# Patient Record
Sex: Male | Born: 1955
Health system: Southern US, Community
[De-identification: ages and names within clinical notes are randomized; demographics above are authoritative.]

## PROBLEM LIST (undated history)

## (undated) DIAGNOSIS — G4730 Sleep apnea, unspecified: Secondary | ICD-10-CM

## (undated) DIAGNOSIS — I10 Essential (primary) hypertension: Secondary | ICD-10-CM

## (undated) DIAGNOSIS — H269 Unspecified cataract: Secondary | ICD-10-CM

## (undated) DIAGNOSIS — E785 Hyperlipidemia, unspecified: Secondary | ICD-10-CM

## (undated) DIAGNOSIS — Z87891 Personal history of nicotine dependence: Secondary | ICD-10-CM

## (undated) DIAGNOSIS — I2510 Atherosclerotic heart disease of native coronary artery without angina pectoris: Secondary | ICD-10-CM

## (undated) DIAGNOSIS — E119 Type 2 diabetes mellitus without complications: Secondary | ICD-10-CM

## (undated) DIAGNOSIS — K429 Umbilical hernia without obstruction or gangrene: Secondary | ICD-10-CM

## (undated) DIAGNOSIS — G4733 Obstructive sleep apnea (adult) (pediatric): Secondary | ICD-10-CM

## (undated) DIAGNOSIS — K219 Gastro-esophageal reflux disease without esophagitis: Secondary | ICD-10-CM

## (undated) HISTORY — DX: Atherosclerotic heart disease of native coronary artery without angina pectoris: I25.10

## (undated) HISTORY — DX: Obstructive sleep apnea (adult) (pediatric): G47.33

## (undated) HISTORY — DX: Gastro-esophageal reflux disease without esophagitis: K21.9

## (undated) HISTORY — DX: Sleep apnea, unspecified: G47.30

## (undated) HISTORY — DX: Essential (primary) hypertension: I10

## (undated) HISTORY — DX: Hyperlipidemia, unspecified: E78.5

## (undated) HISTORY — DX: Type 2 diabetes mellitus without complications: E11.9

## (undated) HISTORY — DX: Unspecified cataract: H26.9

## (undated) HISTORY — DX: Umbilical hernia without obstruction or gangrene: K42.9

## (undated) HISTORY — DX: Personal history of nicotine dependence: Z87.891

---

## 1994-09-15 HISTORY — PX: SIGMOIDOSCOPY: SUR1295

## 1999-09-16 HISTORY — PX: SINUS SURGERY WITH INSTATRAK: SHX5215

## 2000-09-15 DIAGNOSIS — I2510 Atherosclerotic heart disease of native coronary artery without angina pectoris: Secondary | ICD-10-CM

## 2000-09-15 HISTORY — DX: Atherosclerotic heart disease of native coronary artery without angina pectoris: I25.10

## 2001-04-15 HISTORY — PX: CORONARY STENT PLACEMENT: SHX1402

## 2001-05-01 ENCOUNTER — Encounter: Payer: Self-pay | Admitting: *Deleted

## 2001-05-01 ENCOUNTER — Inpatient Hospital Stay (HOSPITAL_COMMUNITY): Admission: RE | Admit: 2001-05-01 | Discharge: 2001-05-06 | Payer: Self-pay | Admitting: *Deleted

## 2002-09-15 HISTORY — PX: FINGER FRACTURE SURGERY: SHX638

## 2003-02-28 ENCOUNTER — Emergency Department (HOSPITAL_COMMUNITY): Admission: EM | Admit: 2003-02-28 | Discharge: 2003-02-28 | Payer: Self-pay | Admitting: Emergency Medicine

## 2003-02-28 ENCOUNTER — Encounter: Payer: Self-pay | Admitting: Emergency Medicine

## 2003-02-28 ENCOUNTER — Ambulatory Visit (HOSPITAL_BASED_OUTPATIENT_CLINIC_OR_DEPARTMENT_OTHER): Admission: RE | Admit: 2003-02-28 | Discharge: 2003-02-28 | Payer: Self-pay | Admitting: Orthopedic Surgery

## 2003-04-18 ENCOUNTER — Ambulatory Visit (HOSPITAL_BASED_OUTPATIENT_CLINIC_OR_DEPARTMENT_OTHER): Admission: RE | Admit: 2003-04-18 | Discharge: 2003-04-18 | Payer: Self-pay | Admitting: Orthopedic Surgery

## 2004-10-22 ENCOUNTER — Ambulatory Visit: Payer: Self-pay | Admitting: Cardiology

## 2004-10-30 ENCOUNTER — Ambulatory Visit: Payer: Self-pay

## 2005-12-11 ENCOUNTER — Ambulatory Visit: Payer: Self-pay | Admitting: Cardiology

## 2005-12-23 ENCOUNTER — Ambulatory Visit: Payer: Self-pay

## 2006-09-15 HISTORY — PX: ANAL FISSURE REPAIR: SHX2312

## 2006-11-26 ENCOUNTER — Ambulatory Visit: Payer: Self-pay | Admitting: Cardiology

## 2006-11-26 LAB — CONVERTED CEMR LAB
BUN: 11 mg/dL (ref 6–23)
Basophils Absolute: 0 10*3/uL (ref 0.0–0.1)
Basophils Relative: 0.4 % (ref 0.0–1.0)
CO2: 30 meq/L (ref 19–32)
Calcium: 9.2 mg/dL (ref 8.4–10.5)
Chloride: 103 meq/L (ref 96–112)
Cholesterol: 132 mg/dL (ref 0–200)
Creatinine, Ser: 1.2 mg/dL (ref 0.4–1.5)
Eosinophils Absolute: 0.1 10*3/uL (ref 0.0–0.6)
Eosinophils Relative: 1.2 % (ref 0.0–5.0)
GFR calc Af Amer: 83 mL/min
GFR calc non Af Amer: 68 mL/min
Glucose, Bld: 86 mg/dL (ref 70–99)
HCT: 42.2 % (ref 39.0–52.0)
HDL: 31 mg/dL — ABNORMAL LOW (ref >39.0)
Hemoglobin: 14.8 g/dL (ref 13.0–17.0)
LDL Cholesterol: 82 mg/dL (ref 0–99)
Lymphocytes Relative: 29.8 % (ref 12.0–46.0)
MCHC: 34.9 g/dL (ref 30.0–36.0)
MCV: 87.3 fL (ref 78.0–100.0)
Monocytes Absolute: 0.7 10*3/uL (ref 0.2–0.7)
Monocytes Relative: 8.9 % (ref 3.0–11.0)
Neutro Abs: 4.6 10*3/uL (ref 1.4–7.7)
Neutrophils Relative %: 59.7 % (ref 43.0–77.0)
Platelets: 261 10*3/uL (ref 150–400)
Potassium: 4.2 meq/L (ref 3.5–5.1)
RBC: 4.84 M/uL (ref 4.22–5.81)
RDW: 12.8 % (ref 11.5–14.6)
Sodium: 140 meq/L (ref 135–145)
TSH: 2.87 microintl units/mL (ref 0.35–5.50)
Total CHOL/HDL Ratio: 4.3
Triglycerides: 97 mg/dL (ref 0–149)
VLDL: 19 mg/dL (ref 0–40)
WBC: 7.7 10*3/uL (ref 4.5–10.5)

## 2007-06-14 ENCOUNTER — Encounter: Admission: RE | Admit: 2007-06-14 | Discharge: 2007-06-14 | Payer: Self-pay | Admitting: Surgery

## 2007-06-16 ENCOUNTER — Ambulatory Visit (HOSPITAL_BASED_OUTPATIENT_CLINIC_OR_DEPARTMENT_OTHER): Admission: RE | Admit: 2007-06-16 | Discharge: 2007-06-16 | Payer: Self-pay | Admitting: Surgery

## 2007-06-16 ENCOUNTER — Encounter (INDEPENDENT_AMBULATORY_CARE_PROVIDER_SITE_OTHER): Payer: Self-pay | Admitting: Surgery

## 2007-11-18 ENCOUNTER — Ambulatory Visit: Payer: Self-pay | Admitting: Cardiology

## 2007-11-18 LAB — CONVERTED CEMR LAB
ALT: 48 units/L (ref 0–53)
AST: 36 units/L (ref 0–37)
Albumin: 4.1 g/dL (ref 3.5–5.2)
Alkaline Phosphatase: 84 units/L (ref 39–117)
BUN: 11 mg/dL (ref 6–23)
Basophils Absolute: 0.1 10*3/uL (ref 0.0–0.1)
Basophils Relative: 0.7 % (ref 0.0–1.0)
Bilirubin, Direct: 0.2 mg/dL (ref 0.0–0.3)
CO2: 30 meq/L (ref 19–32)
Calcium: 9.5 mg/dL (ref 8.4–10.5)
Chloride: 102 meq/L (ref 96–112)
Cholesterol: 131 mg/dL (ref 0–200)
Creatinine, Ser: 1.2 mg/dL (ref 0.4–1.5)
Eosinophils Absolute: 0.1 10*3/uL (ref 0.0–0.6)
Eosinophils Relative: 1 % (ref 0.0–5.0)
GFR calc Af Amer: 82 mL/min
GFR calc non Af Amer: 68 mL/min
Glucose, Bld: 110 mg/dL — ABNORMAL HIGH (ref 70–99)
HCT: 46.5 % (ref 39.0–52.0)
HDL: 22.4 mg/dL — ABNORMAL LOW (ref >39.0)
Hemoglobin: 15.7 g/dL (ref 13.0–17.0)
LDL Cholesterol: 87 mg/dL (ref 0–99)
Lymphocytes Relative: 27.7 % (ref 12.0–46.0)
MCHC: 33.8 g/dL (ref 30.0–36.0)
MCV: 88.2 fL (ref 78.0–100.0)
Magnesium: 1.9 mg/dL (ref 1.5–2.5)
Monocytes Absolute: 0.8 10*3/uL — ABNORMAL HIGH (ref 0.2–0.7)
Monocytes Relative: 8.3 % (ref 3.0–11.0)
Neutro Abs: 5.6 10*3/uL (ref 1.4–7.7)
Neutrophils Relative %: 62.3 % (ref 43.0–77.0)
Platelets: 226 10*3/uL (ref 150–400)
Potassium: 4.6 meq/L (ref 3.5–5.1)
RBC: 5.27 M/uL (ref 4.22–5.81)
RDW: 12.4 % (ref 11.5–14.6)
Sodium: 138 meq/L (ref 135–145)
TSH: 2.77 microintl units/mL (ref 0.35–5.50)
Total Bilirubin: 1.1 mg/dL (ref 0.3–1.2)
Total CHOL/HDL Ratio: 5.8
Total Protein: 7.5 g/dL (ref 6.0–8.3)
Triglycerides: 110 mg/dL (ref 0–149)
VLDL: 22 mg/dL (ref 0–40)
WBC: 9.1 10*3/uL (ref 4.5–10.5)

## 2007-11-26 DIAGNOSIS — I2510 Atherosclerotic heart disease of native coronary artery without angina pectoris: Secondary | ICD-10-CM | POA: Insufficient documentation

## 2007-11-26 DIAGNOSIS — E1169 Type 2 diabetes mellitus with other specified complication: Secondary | ICD-10-CM | POA: Insufficient documentation

## 2007-11-26 DIAGNOSIS — R0609 Other forms of dyspnea: Secondary | ICD-10-CM | POA: Insufficient documentation

## 2007-11-26 DIAGNOSIS — R0602 Shortness of breath: Secondary | ICD-10-CM | POA: Insufficient documentation

## 2007-11-26 DIAGNOSIS — K219 Gastro-esophageal reflux disease without esophagitis: Secondary | ICD-10-CM | POA: Insufficient documentation

## 2007-11-26 DIAGNOSIS — E785 Hyperlipidemia, unspecified: Secondary | ICD-10-CM

## 2007-11-26 DIAGNOSIS — R0600 Dyspnea, unspecified: Secondary | ICD-10-CM | POA: Insufficient documentation

## 2007-11-29 ENCOUNTER — Ambulatory Visit: Payer: Self-pay | Admitting: Pulmonary Disease

## 2007-11-29 DIAGNOSIS — G4733 Obstructive sleep apnea (adult) (pediatric): Secondary | ICD-10-CM | POA: Insufficient documentation

## 2007-11-30 ENCOUNTER — Encounter: Payer: Self-pay | Admitting: Pulmonary Disease

## 2007-11-30 ENCOUNTER — Ambulatory Visit (HOSPITAL_BASED_OUTPATIENT_CLINIC_OR_DEPARTMENT_OTHER): Admission: RE | Admit: 2007-11-30 | Discharge: 2007-11-30 | Payer: Self-pay | Admitting: Pulmonary Disease

## 2007-12-21 ENCOUNTER — Ambulatory Visit: Payer: Self-pay | Admitting: Pulmonary Disease

## 2007-12-22 ENCOUNTER — Telehealth (INDEPENDENT_AMBULATORY_CARE_PROVIDER_SITE_OTHER): Payer: Self-pay | Admitting: *Deleted

## 2007-12-27 ENCOUNTER — Ambulatory Visit: Payer: Self-pay | Admitting: Pulmonary Disease

## 2008-01-07 ENCOUNTER — Telehealth: Payer: Self-pay | Admitting: Pulmonary Disease

## 2008-11-17 ENCOUNTER — Encounter: Payer: Self-pay | Admitting: Cardiology

## 2008-11-17 ENCOUNTER — Ambulatory Visit: Payer: Self-pay | Admitting: Cardiology

## 2008-11-17 LAB — CONVERTED CEMR LAB
ALT: 40 units/L (ref 0–53)
AST: 33 units/L (ref 0–37)
Albumin: 4.1 g/dL (ref 3.5–5.2)
Alkaline Phosphatase: 83 units/L (ref 39–117)
BUN: 16 mg/dL (ref 6–23)
Basophils Absolute: 0 10*3/uL (ref 0.0–0.1)
Basophils Relative: 0.3 % (ref 0.0–3.0)
Bilirubin, Direct: 0.2 mg/dL (ref 0.0–0.3)
CO2: 27 meq/L (ref 19–32)
Calcium: 9.3 mg/dL (ref 8.4–10.5)
Chloride: 102 meq/L (ref 96–112)
Cholesterol: 116 mg/dL (ref 0–200)
Creatinine, Ser: 1.1 mg/dL (ref 0.4–1.5)
Eosinophils Absolute: 0.1 10*3/uL (ref 0.0–0.7)
Eosinophils Relative: 0.7 % (ref 0.0–5.0)
GFR calc Af Amer: 91 mL/min
GFR calc non Af Amer: 75 mL/min
Glucose, Bld: 99 mg/dL (ref 70–99)
HCT: 43.5 % (ref 39.0–52.0)
HDL: 26.3 mg/dL — ABNORMAL LOW (ref >39.0)
Hemoglobin: 15.3 g/dL (ref 13.0–17.0)
LDL Cholesterol: 69 mg/dL (ref 0–99)
Lymphocytes Relative: 26.5 % (ref 12.0–46.0)
MCHC: 35.1 g/dL (ref 30.0–36.0)
MCV: 88 fL (ref 78.0–100.0)
Monocytes Absolute: 0.8 10*3/uL (ref 0.1–1.0)
Monocytes Relative: 8.1 % (ref 3.0–12.0)
Neutro Abs: 6.5 10*3/uL (ref 1.4–7.7)
Neutrophils Relative %: 64.4 % (ref 43.0–77.0)
Platelets: 216 10*3/uL (ref 150–400)
Potassium: 3.9 meq/L (ref 3.5–5.1)
RBC: 4.94 M/uL (ref 4.22–5.81)
RDW: 12 % (ref 11.5–14.6)
Sodium: 138 meq/L (ref 135–145)
TSH: 2.63 microintl units/mL (ref 0.35–5.50)
Total Bilirubin: 1.2 mg/dL (ref 0.3–1.2)
Total CHOL/HDL Ratio: 4.4
Total Protein: 7.7 g/dL (ref 6.0–8.3)
Triglycerides: 102 mg/dL (ref 0–149)
VLDL: 20 mg/dL (ref 0–40)
WBC: 10 10*3/uL (ref 4.5–10.5)

## 2009-11-16 ENCOUNTER — Ambulatory Visit: Payer: Self-pay | Admitting: Cardiology

## 2010-10-13 LAB — CONVERTED CEMR LAB
ALT: 44 units/L (ref 0–53)
AST: 37 units/L (ref 0–37)
Albumin: 4.1 g/dL (ref 3.5–5.2)
Alkaline Phosphatase: 79 units/L (ref 39–117)
BUN: 12 mg/dL (ref 6–23)
Basophils Absolute: 0 10*3/uL (ref 0.0–0.1)
Basophils Relative: 0.4 % (ref 0.0–3.0)
Bilirubin, Direct: 0.1 mg/dL (ref 0.0–0.3)
CO2: 30 meq/L (ref 19–32)
Calcium: 9.1 mg/dL (ref 8.4–10.5)
Chloride: 102 meq/L (ref 96–112)
Cholesterol: 111 mg/dL (ref 0–200)
Creatinine, Ser: 1.1 mg/dL (ref 0.4–1.5)
Eosinophils Absolute: 0.1 10*3/uL (ref 0.0–0.7)
Eosinophils Relative: 1.1 % (ref 0.0–5.0)
GFR calc non Af Amer: 74.44 mL/min (ref >60)
Glucose, Bld: 114 mg/dL — ABNORMAL HIGH (ref 70–99)
HCT: 45 % (ref 39.0–52.0)
HDL: 33.5 mg/dL — ABNORMAL LOW (ref >39.00)
Hemoglobin: 14.9 g/dL (ref 13.0–17.0)
LDL Cholesterol: 50 mg/dL (ref 0–99)
Lymphocytes Relative: 28.4 % (ref 12.0–46.0)
Lymphs Abs: 2.3 10*3/uL (ref 0.7–4.0)
MCHC: 33.1 g/dL (ref 30.0–36.0)
MCV: 90.7 fL (ref 78.0–100.0)
Monocytes Absolute: 0.6 10*3/uL (ref 0.1–1.0)
Monocytes Relative: 7.2 % (ref 3.0–12.0)
Neutro Abs: 5.2 10*3/uL (ref 1.4–7.7)
Neutrophils Relative %: 62.9 % (ref 43.0–77.0)
Platelets: 238 10*3/uL (ref 150.0–400.0)
Potassium: 4.5 meq/L (ref 3.5–5.1)
RBC: 4.96 M/uL (ref 4.22–5.81)
RDW: 12.5 % (ref 11.5–14.6)
Sodium: 137 meq/L (ref 135–145)
TSH: 1.97 microintl units/mL (ref 0.35–5.50)
Total Bilirubin: 0.6 mg/dL (ref 0.3–1.2)
Total CHOL/HDL Ratio: 3
Total Protein: 8 g/dL (ref 6.0–8.3)
Triglycerides: 139 mg/dL (ref 0.0–149.0)
VLDL: 27.8 mg/dL (ref 0.0–40.0)
WBC: 8.2 10*3/uL (ref 4.5–10.5)

## 2010-10-17 NOTE — Assessment & Plan Note (Signed)
Summary: yearly  Medications Added LANSOPRAZOLE 30 MG TBDP (LANSOPRAZOLE) Take 1 tablet by mouth once a day      Allergies Added: NKDA  Visit Type:  Follow-up Referring Provider:  Charlies Constable Primary Provider:  Marjory Lies  CC:  no complaints.  History of Present Illness: The patient is 55 years old and return for management of CAD. In 2002 he had PCI with stenting of all 3 vessels for three-vessel disease with bare-metal stents. He's done quite well since that time. He's had no recent chest pain shortness of breath or palpitations.  He's working long hours at [employer].  He is in the maintenance department.  He has gained about 30 pounds since he quit smoking when he had his stents put in in 2002. He said he's gained about 3 pounds over the last year.  Current Medications (verified): 1)  Lansoprazole 30 Mg Tbdp (Lansoprazole) .... Take 1 Tablet By Mouth Once A Day 2)  Sg Enteric Coated Aspirin 325 Mg  Tbec (Aspirin) .... Take 1 Tablet By Mouth Once A Day 3)  Lipitor 20 Mg  Tabs (Atorvastatin Calcium) .... Take 1 Tablet By Mouth Once A Day 4)  Toprol Xl 25 Mg  Tb24 (Metoprolol Succinate) .... Take 1/2 Tab By Mouth Once Daily 5)  Nitroquick 0.4 Mg  Subl (Nitroglycerin) .... Use As Needed  Allergies (verified): No Known Drug Allergies  Past History:  Past Medical History: Reviewed history from 11/16/2008 and no changes required. Current Problems:  DYSPNEA ON EXERTION (ICD-786.09) GERD (ICD-530.81) HYPERLIPIDEMIA (ICD-272.4) CORONARY ARTERY DISEASE (ICD-414.00) S/P 3 vessel PCI 2002     Review of Systems       ROS is negative except as outlined in HPI.   Vital Signs:  Patient profile:   55 year old male Height:      70 inches Weight:      254 pounds BMI:     36.58 Pulse rate:   48 / minute BP sitting:   132 / 88  (left arm) Cuff size:   regular  Vitals Entered By: Hardin Negus, RMA (November 16, 2009 9:40 AM)  Physical Exam  Additional Exam:  Gen.  Well-nourished, in no distress   Neck: No JVD, thyroid not enlarged, no carotid bruits Lungs: No tachypnea, clear without rales, rhonchi or wheezes Cardiovascular: Rhythm regular, PMI not displaced,  heart sounds  normal, no murmurs or gallops, no peripheral edema, pulses normal in all 4 extremities. Abdomen: BS normal, abdomen soft and non-tender without masses or organomegaly, no hepatosplenomegaly. MS: No deformities, no cyanosis or clubbing   Neuro:  No focal sns   Skin:  no lesions    Impression & Recommendations:  Problem # 1:  CORONARY ARTERY DISEASE (ICD-414.00) He had bare-metal stents placed in all 3 arteries in 2002 for three-vessel disease. He has been stable without chest pain. This appears stable. His updated medication list for this problem includes:    Sg Enteric Coated Aspirin 325 Mg Tbec (Aspirin) .Marland Kitchen... Take 1 tablet by mouth once a day    Toprol Xl 25 Mg Tb24 (Metoprolol succinate) .Marland Kitchen... Take 1/2 tab by mouth once daily    Nitroquick 0.4 Mg Subl (Nitroglycerin) ..... Use as needed  Orders: EKG w/ Interpretation (93000) TLB-BMP (Basic Metabolic Panel-BMET) (80048-METABOL) TLB-CBC Platelet - w/Differential (85025-CBCD) TLB-Hepatic/Liver Function Pnl (80076-HEPATIC) TLB-Lipid Panel (80061-LIPID) TLB-TSH (Thyroid Stimulating Hormone) (84443-TSH)  Problem # 2:  HYPERLIPIDEMIA (ICD-272.4) He is currently on Lipitor.  We will check a lipid profile today. His  updated medication list for this problem includes:    Lipitor 20 Mg Tabs (Atorvastatin calcium) .Marland Kitchen... Take 1 tablet by mouth once a day  Orders: TLB-BMP (Basic Metabolic Panel-BMET) (80048-METABOL) TLB-CBC Platelet - w/Differential (85025-CBCD) TLB-Hepatic/Liver Function Pnl (80076-HEPATIC) TLB-Lipid Panel (80061-LIPID) TLB-TSH (Thyroid Stimulating Hormone) (84443-TSH)  Problem # 3:  HYPERTENSION, BENIGN (ICD-401.1) this is controlled on current medications. His updated medication list for this problem  includes:    Sg Enteric Coated Aspirin 325 Mg Tbec (Aspirin) .Marland Kitchen... Take 1 tablet by mouth once a day    Toprol Xl 25 Mg Tb24 (Metoprolol succinate) .Marland Kitchen... Take 1/2 tab by mouth once daily  Patient Instructions: 1)  Your physician recommends that you have lab work today: bmet/cbc/tsh/lipid/liver (414.01;272.2;402.10) 2)  Your physician wants you to follow-up in: 1 year with Dr. Shirlee Latch.  You will receive a reminder letter in the mail two months in advance. If you don't receive a letter, please call our office to schedule the follow-up appointment. Prescriptions: NITROQUICK 0.4 MG  SUBL (NITROGLYCERIN) use as needed  #25 x 3   Entered by:   Sherri Rad, RN, BSN   Authorized by:   Lenoria Farrier, MD, [facility]   Signed by:   Sherri Rad, RN, BSN on 11/16/2009   Method used:   Electronically to        [id number] (ret[facility])       [address]       [place], [place]  [address]       Ph: [phone]       Fax: [phone]   RxID:   [id number] TOPROL XL 25 MG  TB24 (METOPROLOL SUCCINATE) take 1/2 tab by mouth once daily  #30 x 6   Entered by:   Sherri Rad, RN, BSN   Authorized by:   Lenoria Farrier, MD, [facility]   Signed by:   Sherri Rad, RN, BSN on 11/16/2009   Method used:   Electronically to        [id number] ([facility])       [address]       [place], [place]  [address]       Ph: [phone]       Fax: [phone]   RxID:   [id number] LIPITOR 20 MG  TABS (ATORVASTATIN CALCIUM) Take 1 tablet by mouth once a day  #30 x 11   Entered by:   Sherri Rad, RN, BSN   Authorized by:   Lenoria Farrier, MD, [facility]   Signed by:   Sherri Rad, RN, BSN on 11/16/2009   Method used:   Electronically to        [facility] [address] [id number] ([facility]       [address]       [place], [place]  [address]       Ph: [phone]       Fax: [phone]   RxID:    [id number] LANSOPRAZOLE 30 MG TBDP (LANSOPRAZOLE) Take 1 tablet by mouth once a day  #30 x 11   Entered by:   Sherri Rad, RN, BSN   Authorized by:   Lenoria Farrier, MD, [facility]   Signed by:   Sherri Rad, RN, BSN on 11/16/2009   Method used:   Electronically to  [facility] [address] [id number] ([facility])       [address]       [place], [place]  [address]       Ph: [phone]       Fax: [phone]   RxID:   [id number]

## 2011-01-27 ENCOUNTER — Encounter: Payer: Self-pay | Admitting: Cardiology

## 2011-01-28 NOTE — Progress Notes (Signed)
[facility]                        PERIPHERAL VASCULAR OFFICE NOTE   [name], [name]                       MRN:          [id number]  DATE:11/18/2007                            DOB:          04/01/1956    PRIMARY CARE PHYSICIAN:  Marjory Lies, M.D.   CLINICAL HISTORY:  Mr. 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  He returned for management of his  coronary heart disease.  In 2002, he had 3-vessel intervention with bare  metal stents to the LAD and right coronary artery and cutting-balloon  angioplasty to the circumflex artery.  He had a scan in 2007 which  showed no evidence of ischemia.  He says he has done quite well.  He has had no chest pain, shortness of  breath, or palpitations.  He does indicate that he has had some snoring  at night and his wife indicates that he has had some breathing  difficulty with apneic spells at night and was concerned about the  possibility of sleep apnea.   PAST MEDICAL HISTORY:  1. GERD.  2. Hyperlipidemia.   CURRENT MEDICATIONS:  1. Prilosec.  2. Aspirin.  3. Lipitor 20 mg.  4. Toprol XL 25 mg one half tablet daily.   PHYSICAL EXAMINATION:  VITAL SIGNS:  Blood pressure 114/80 and the pulse  64.  NECK:  There was no venous distention.  The carotid pulses were full  without bruits.  CHEST:  Clear.  CARDIAC:  Rhythm was regular.  The heart sounds were normal.  There were  no murmurs or gallops.  ABDOMEN:  Soft without organomegaly.  EXTREMITIES:  Peripheral pulses were full.  There is no peripheral  edema.   Electrocardiogram was normal.   IMPRESSION:  1. Coronary artery disease status post triple vessel percutaneous      coronary intervention with bare metal stent in 2002, stable.  2. Good left ventricular function.  3. Hyperlipidemia with low HDL.  4. Symptoms suggestive of obstructive sleep apnea.   RECOMMENDATIONS:  1. We will plan to get  laboratory studies today including a lipid,      liver, CBC, BMP, TSH, and magnesium level.  2. He has had some cramps, so we will get the magnesium level.  3. We will also get a pulmonary consult for evaluation of possible      sleep apnea.  I encouraged him to loose weight.  It appears that      his other risk factors are controlled with his cholesterol levels      pending.  4. I will plan to see him back in a year.     Bruce Elvera Lennox Juanda Chance, MD, [facility]  Electronically Signed    BRB/MedQ  DD: 11/18/2007  DT: 11/18/2007  Job #: [id number]   cc:   Marjory Lies, M.D.

## 2011-01-28 NOTE — Procedures (Signed)
[name], [name]                ACCOUNT NO.:  [id number]   MEDICAL RECORD NO.:  [id number]          PATIENT TYPE:  OUT   LOCATION:  SLEEP CENTER                 FACILITY:  [facility]   PHYSICIAN:  Barbaraann Share, MD,FCCPDATE OF BIRTH:  May 25, 1956   DATE OF STUDY:  11/30/2007                            NOCTURNAL POLYSOMNOGRAM   REFERRING PHYSICIAN:   REFERRING PHYSICIAN:  Barbaraann Share, MD,FCCP   INDICATIONS FOR STUDY:  Hypersomnia with sleep apnea.   EPWORTH SCORE:  9.   SLEEP ARCHITECTURE:  The patient had a total sleep time of 272 minutes  with no slow wave sleep and only 5 minutes of REM.  Sleep onset latency  was prolonged at 44 minutes, and REM latency was very prolonged at 337  minutes.  Sleep efficiency was poor at 70%.   RESPIRATORY DATA:  The patient was found 205 apneas and 112 obstructive  hypopneas for an apnea-hypopnea of 70 events per hour.  The events  occurred in all body positions and there was loud to very loud snoring  noted throughout.   OXYGEN DATA:  There was 02 desaturation as low as 80% with the patient's  obstructive events.   CARDIAC DATA:  No clinically significant arrhythmia noted.   MOVEMENT/PARASOMNIA:  None.   IMPRESSION/RECOMMENDATIONS:  Severe obstructive sleep apnea/hypopnea  syndrome with an apnea-hypopnea index of 70 events per hour and 02  desaturation as low as 80%.  Treatment for this degree of sleep apnea  should focus primarily on weight loss as well as CPAP.      Barbaraann Share, MD,FCCP  Diplomate, American Board of Sleep  Medicine  Electronically Signed     KMC/MEDQ  D:  12/21/2007 16:56:26  T:  12/21/2007 19:04:24  Job:  [id number]

## 2011-01-28 NOTE — Op Note (Signed)
[name], [name]                ACCOUNT NO.:  [id number]   MEDICAL RECORD NO.:  [id number]          PATIENT TYPE:  AMB   LOCATION:  DSC                          FACILITY:  [facility]   PHYSICIAN:  Thomas A. Cornett, M.D.DATE OF BIRTH:  Aug 06, 1956   DATE OF PROCEDURE:  06/16/2007  DATE OF DISCHARGE:                               OPERATIVE REPORT   PREOPERATIVE DIAGNOSIS:  Nonhealing chronic posterior midline anal  fissure.   POSTOPERATIVE DIAGNOSIS:  1. Nonhealing chronic anal fissure.  2. Two column complex internal/external hemorrhoid disease.   PROCEDURES:  1. Lateral internal sphincterotomy.  2. Two-column internal/external hemorrhoidectomy.   SURGEON:  Maisie Fus A. Cornett, M.D.   ANESTHESIA:  General endotracheal anesthesia with 20 mL of 0.25%  Sensorcaine local.   ESTIMATED BLOOD LOSS:  80 mL.   DRAINS:  None.   SPECIMENS:  None.   INDICATIONS FOR PROCEDURE:  The patient is a 55 year old male whose had  a chronic posterior midline anal fissure.  It had been managed medically  for this for a number of months, with intermittent results.  Unfortunately, it will not heal completely.  He continues to have  significant pain despite maximal medical management; and presents today  for surgical therapy of this.  He also has a history of internal and  external hemorrhoids as well, and again these were managed as well  medically for a number of years without any significant success.  Informed consent was obtained.  The procedure was discussed at length  with the patient preoperatively.   DESCRIPTION OF PROCEDURE:  The patient was brought to the operating room  and placed supine.  After intubation, he was then placed in a prone  jackknife position.  Buttocks were taped apart.  The anal canal and skin  were prepped and draped in a sterile fashion.  Digital examination was  done.  This was normal.  External examination reveals external  hemorrhoids.  In the posterior midline is a  very deep nonhealing anal  fissure down to the sphincter mechanism.  The anoscope was placed.  I  chose a place in the left lateral anal canal.  I was able to palpate the  internal sphincter, external sphincter, and the intersphincteric groove  with my finger.  I opened it without using a scalpel over this, and used  a hemostat to spread the mucosa away.  I was able to identify the  internal sphincter and the external sphincter.  A hemostat was placed in  the intersphincteric groove and I divided the internal sphincter up to  the dentate line with satisfactory result.  In this area on the  patient's left lateral position was a grade 3 hemorrhoid, and I went  ahead and placed a stitch in the apex of this.  I then emptied the  hemorrhoid tissue with Metzenbaum scissors.  I reapproximated the mucosa  for hemostasis over this, leaving the bottom portion open for drainage.  In the patient's right posterior position, he had a grade 3 hemorrhoid  as well with an external component.  We placed a stitch in the apex and  excised this using Metzenbaum scissors, to include both the internal and  external component.  This was oversewn with a 4-0 Monocryl.  Hemostasis  was achieved.  Packing using Gelfoam and Surgicel were placed in the  anal canal, after placing 2% lidocaine jelly in the anal canal.  A  perianal block was then administered using 0.25% Sensorcaine plain.  A  large ABD pad was placed.  All final counts of sponge, needle and  instruments were found to be correct at this portion of the case.  The  patient was then placed supine, extubated, and taken to recovery in  satisfactory condition.      Thomas A. Cornett, M.D.  Electronically Signed     TAC/MEDQ  D:  06/16/2007  T:  06/16/2007  Job:  [id number]   cc:   Marjory Lies, M.D.

## 2011-01-29 ENCOUNTER — Ambulatory Visit (INDEPENDENT_AMBULATORY_CARE_PROVIDER_SITE_OTHER): Payer: 59 | Admitting: Cardiology

## 2011-01-29 ENCOUNTER — Encounter: Payer: Self-pay | Admitting: Cardiology

## 2011-01-29 VITALS — BP 126/80 | HR 61 | Ht 69.0 in | Wt 257.1 lb

## 2011-01-29 DIAGNOSIS — E785 Hyperlipidemia, unspecified: Secondary | ICD-10-CM

## 2011-01-29 DIAGNOSIS — E7800 Pure hypercholesterolemia, unspecified: Secondary | ICD-10-CM

## 2011-01-29 DIAGNOSIS — I2510 Atherosclerotic heart disease of native coronary artery without angina pectoris: Secondary | ICD-10-CM

## 2011-01-29 MED ORDER — ATORVASTATIN CALCIUM 20 MG PO TABS: 20.0000 mg | ORAL_TABLET | Freq: Every day | ORAL | Status: DC

## 2011-01-29 MED ORDER — METOPROLOL SUCCINATE ER 25 MG PO TB24: ORAL_TABLET | ORAL | Status: DC

## 2011-01-29 MED ORDER — ESOMEPRAZOLE MAGNESIUM 40 MG PO CPDR: 40.0000 mg | DELAYED_RELEASE_CAPSULE | Freq: Every day | ORAL | Status: DC

## 2011-01-29 MED ORDER — NITROGLYCERIN 0.4 MG SL SUBL: 0.4000 mg | SUBLINGUAL_TABLET | SUBLINGUAL | Status: DC | PRN

## 2011-01-29 NOTE — Patient Instructions (Addendum)
Lab today--Lipid profile./liver profile/ BMP/CBC/TSH/Hgb A1C    414.01  272.0  Stop Omeprazole.  Start Nexium 40mg  daily.  Your physician wants you to follow-up in: 1 year with Dr Shirlee Latch.(May 2013). You will receive a reminder letter in the mail two months in advance. If you don't receive a letter, please call our office to schedule the follow-up appointment.

## 2011-01-30 LAB — CBC WITH DIFFERENTIAL/PLATELET
Basophils Absolute: 0 10*3/uL (ref 0.0–0.1)
Basophils Relative: 0.3 % (ref 0.0–3.0)
Eosinophils Absolute: 0.1 10*3/uL (ref 0.0–0.7)
Hemoglobin: 15 g/dL (ref 13.0–17.0)
Lymphocytes Relative: 26.2 % (ref 12.0–46.0)
Lymphs Abs: 2.7 10*3/uL (ref 0.7–4.0)
MCHC: 34.9 g/dL (ref 30.0–36.0)
MCV: 89.3 fl (ref 78.0–100.0)
Monocytes Absolute: 0.7 10*3/uL (ref 0.1–1.0)
Neutro Abs: 6.8 10*3/uL (ref 1.4–7.7)
RBC: 4.81 Mil/uL (ref 4.22–5.81)
RDW: 13.1 % (ref 11.5–14.6)

## 2011-01-30 LAB — HEPATIC FUNCTION PANEL
ALT: 35 U/L (ref 0–53)
AST: 31 U/L (ref 0–37)
Albumin: 3.8 g/dL (ref 3.5–5.2)
Total Bilirubin: 0.6 mg/dL (ref 0.3–1.2)
Total Protein: 7.4 g/dL (ref 6.0–8.3)

## 2011-01-30 LAB — LIPID PANEL: Total CHOL/HDL Ratio: 3

## 2011-01-30 LAB — BASIC METABOLIC PANEL
CO2: 26 mEq/L (ref 19–32)
Calcium: 9.3 mg/dL (ref 8.4–10.5)
Chloride: 104 mEq/L (ref 96–112)
Glucose, Bld: 96 mg/dL (ref 70–99)
Sodium: 138 mEq/L (ref 135–145)

## 2011-01-30 NOTE — Progress Notes (Signed)
55 yo with history of CAD s/p bare metal stenting to RCA and LAD and angioplasty to CFX in 2002 presents for cardiology followup.  He has seen Dr. Juanda Chance in the past and I am seeing him for the first time today.  He has done well since his last appointment in this office.  No chest pain or nitroglycerine use.  He denies exertional dyspnea.  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  He is tolerating his medications without problems.  ECG: Short PR interval (? Ectopic atrial focus), otherwise normal  Labs (3/11): K 4.5, creatinine 1.1, LDL 50, HDL 33.5, TSH normal  PMH: 1. GERD 2. Hyperlipidemia 3. CAD: 2002 patient had BMS to LAD and RCA and PTCA to CFX.  Myoview in 2007 showed no ischemia.  4. OSA: CPAP.  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  Married, lives in [place].  Quit smoking 2002.   FH: CAD  Current Outpatient Prescriptions  Medication Sig Dispense Refill  . aspirin 325 MG EC tablet Take by mouth daily.        Marland Kitchen atorvastatin (LIPITOR) 20 MG tablet Take 1 tablet (20 mg total) by mouth daily.  90 tablet  3  . metoprolol succinate (TOPROL-XL) 25 MG 24 hr tablet Take 1/2 tablet by mouth once daily.  45 tablet  3  . nitroGLYCERIN (NITROSTAT) 0.4 MG SL tablet Place 1 tablet (0.4 mg total) under the tongue as needed.  100 tablet  3  . esomeprazole (NEXIUM) 40 MG capsule Take 1 capsule (40 mg total) by mouth daily before breakfast.  90 capsule  3    BP 126/80  Pulse 61  Ht 5\' 9"  (1.753 m)  Wt 257 lb 1.9 oz (116.629 kg)  BMI 37.97 kg/m2 General: NAD, obese Neck: Thick, No JVD, no thyromegaly or thyroid nodule.  Lungs: Clear to auscultation bilaterally with normal respiratory effort. CV: Nondisplaced PMI.  Heart regular S1/S2, no S3/S4, no murmur.  No peripheral edema.  No carotid bruit.  Normal pedal pulses.  Abdomen: Soft, nontender, no hepatosplenomegaly, no distention.  Neurologic: Alert and oriented x 3.  Psych: Normal  affect. Extremities: No clubbing or cyanosis.   ROS: All systems reviewed and negative except as per HPI.

## 2011-01-30 NOTE — Assessment & Plan Note (Signed)
No ischemic symptoms.  Continue ASA, Lipitor, Toprol XL.  Needs to work on exercise, diet, and weight loss.

## 2011-01-30 NOTE — Assessment & Plan Note (Signed)
Check lipids/LFTs. Goal LDL<70.

## 2011-01-31 NOTE — Assessment & Plan Note (Signed)
[facility]                            CARDIOLOGY OFFICE NOTE   NAME:[name], [name]                       MRN:          [id number]  DATE:11/26/2006                            DOB:          [birth year]    PRIMARY CARE PHYSICIAN:  Dr. Marjory Lies.   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  In 2002, he had 3-vessel  coronary intervention with a bare metal stent to the LAD, a bare metal  stent to the right coronary artery, and a cutting balloon angioplasty to  the circumflex artery.  We saw him last year for some symptoms of  dyspnea on exertion.  We did a scan on him which did not show any  ischemia.   He has done quite well since that time.  He has had no recent chest  pain, shortness of breath, or palpitations.  His past medical history is  significant for hyperlipidemia and excess weight and GERD.   CURRENT MEDICATIONS:  1. Prilosec.  2. Aspirin.  3. Lipitor.  4. Toprol.   PHYSICAL EXAMINATION:  The blood pressure is 124/80 and the pulse 64 and  regular.  There is no venous distension.  Carotid pulses were full without bruits.  The chest was clear.  The cardiac rhythm was regular, I could hear no murmurs or gallops.  The abdomen was quite protuberant.  The bowel sounds were normal and the  abdomen was soft.  There is no hepatosplenomegaly.  Peripheral pulses were equal and there is no peripheral edema.   Electrocardiogram was normal.   IMPRESSION:  1. Coronary artery disease, status post triple-vessel percutaneous      coronary intervention with bare metal stents in 2002 with negative      Myoview scan last year, now is stable.  2. Normal left ventricular function.  3. Hyperlipidemia with low HDL.   RECOMMENDATIONS:  I think [name] is doing well from the standpoint  of his heart.  His last HDL was quite low, and I talked to him about  losing weight and about  following a low-glycemic diet.  I will plan to  repeat his lipid profile today, as well as a CBC and BMP and a TSH.  I  will plan to see him back in followup in a year.  Depending on the  results of his lipid profile, and  depending on cost considerations, we might consider switching him to  Zocor, since his LDL was down to 67, normally 20 of Lipitor, and Zocor  might have a better impact on his HDL.     Bruce Elvera Lennox Juanda Chance, MD, [facility]  Electronically Signed    BRB/MedQ  DD: 11/26/2006  DT: 11/27/2006  Job #: [id number]
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
## 2011-01-31 NOTE — Cardiovascular Report (Signed)
[place]. [facility]  Patient:    [name], [name] Visit Number: [id number] MRN: [id number]          Service Type: Attending:  Cecil Cranker, M.D. [facility] Proc. Date: 04/30/01   CC:         Raquel Sarna, M.D., [facility] R. Juanda Chance, M.D. [facility]  Cardiac Catheterization Laboratory   Cardiac Catheterization  INDICATIONS:  The patient is a 55 year old, white male, smoker, with a strong family history of coronary artery disease with recent chest pain and ECG revealing old anteroseptal MI and also with positive Cardiolite scan.  PROCEDURE:  Selective coronary angiography, left ventricular angiography - Judkins technique.  PRESSURES:  LV systolic 102, diastolic ______, AO systolic 102, diastolic 60.  ANGIOGRAPHY:  There is no calcium. 1. Left main:  The left main coronary is normal. 2. Left anterior descending:  The left anterior descending is totally    occluded just after the diagonal and first septal. 3. Circumflex:  The circumflex reveals a large obtuse marginal and then    small AV circumflex with a 90% lesion. 4. Right coronary artery:  The right coronary artery is dominant.  There is    a 90% focal lesion in the mid RCA. 5. Left ventricle:  The left ventricle reveals akinesis in the mid distal    anterior wall.  Overall EF is fairly well preserved.  SUMMARY:  Patient with probable recent anteroseptal MI with total proximal LAD, high-grade lesion in the AV circumflex and high-grade lesion in the RCA with akinesis of the mid distal anterior wall.  The LIMA is patent.  Cine reviewed with Dr. Riley Kill and will also be reviewed with Dr. Juanda Chance, and decision made concerning therapy, either CABG or percutaneous intervention. Attending:  Cecil Cranker, M.D. [facility] DD:  04/30/01 TD:  04/30/01 Job: [id number]

## 2011-01-31 NOTE — Procedures (Signed)
[place]. [facility]  Patient:    [name], [name] Visit Number: [id number] MRN: [id number]          Service Type: MED Location: 6500 [id number] Attending Physician:  Glennon Hamilton Proc. Date: 05/03/01 Admit Date:  04/30/2001                             Procedure Report  CLINICAL HISTORY:  [name] is 55 years old and recently was seen in the office with chest pain and had a Cardiolite scan and EKG suggesting an old anterior infarction.  He was studied last week by Dr. Corinda Gubler and found to have a totally occluded LAD as well as a 90% mid circumflex and a 90% right coronary artery lesion.  After some debate, we decided to treat him percutaneously.  His left ventricular function overall was fairly good although his anterolateral wall and apex were hypokinetic.  DESCRIPTION OF PROCEDURE:  The procedure was performed via the right femoral artery using an arterial sheath and a 7-French JL4 guiding catheter.  The patient was given weight-adjusted heparin to prolong an ACT of greater than 200 seconds.  Integrilin was deferred until we crossed the totally occluded LAD with the wire.  We crossed the LAD with a Graphix PT wire without too much difficulty.  This did not reestablish flow and so we used the X-Port catheter and aspirated.  This also did not establish very good flow.  We then went in with a 2.25- x 20-mm CrossSail and dilated the lesion and then we did get flow down the vessel.  At this point, we decided to deploy the guard wire and we placed this next to the Graphix PT wire and then removed the Graphix PT wire. After inflating the balloon in the distal guard wire, we inflated the 2.25-mm CrossSail with one inflation of eight atmospheres for 34 seconds.  We then performed two aspirations with the X-Port catheter and then deflated the guard wire balloon.  We next reinflated the guard wire balloon and deployed a 2.5- x 23-mm Pixel and deployed this with two  inflations of 12 and 15 atmospheres for 34 and 19 seconds.  We then post dilated with a 3.0- x 15-mm Quantum Ranger in the proximal portion of the stent with one inflation of 15 atmospheres for 34 seconds.  We then aspirated with the X-Port catheter.  The balloon was inadvertently deflated just prior to that aspiration and had to be reinflated. Repeat diagnostics were then performed through the guiding catheter.  We then made a decision to treat a lesion in the mid LAD which was estimated at 80%. We had difficulty passing the 2.25-mm balloon through the stent and had to use a buddy wire to accomplish this.  We then performed three inflations at four atmospheres for one to two minutes each in the mid vessel.  The balloon was then removed and repeat diagnostic studies were performed through the guiding catheter.  In view of the length of the procedure, we decided to defer intervention on the circumflex artery and right coronary artery until Wednesday.  The patient tolerated the procedure well and left the laboratory in satisfactory condition.  RESULTS:  Initially the stenosis in the proximal LAD was 100%.  Following stenting, this improved to less than 10% and the flow improved from TIMI-0 to TIMI-3 flow.  The lesion in the mid LAD was initially 80%.  This improved to 30%  following dilatation.  There were diffuse irregularities in the mid to distal LAD.  CONCLUSION: 1. Successful stenting of the chronically totally occluded proximal left    anterior descending artery lesion using guard wire distal protection with    improvement in percent area narrowing from 100% to less than 10% and    improvement in the flow from TIMI-0 to TIMI-3 flow. 2. Successful percutaneous transluminal coronary angioplasty of the lesion in    the mid left anterior descending artery with improvement in percent area    narrowing from 80% to 30%.  DISPOSITION:  The patient was transferred to the post angioplasty  unit for further observation. Attending Physician:  Glennon Hamilton DD:  05/03/01 TD:  05/03/01 Job: [id number]

## 2011-01-31 NOTE — Op Note (Signed)
NAME:  [name], [name]                          ACCOUNT NO.:  [id number]   MEDICAL RECORD NO.:  [id number]                   PATIENT TYPE:  AMB   LOCATION:  DSC                                  FACILITY:  [facility]   PHYSICIAN:  Katy Fitch. Naaman Plummer., M.D.          DATE OF BIRTH:  02/08/1956   DATE OF PROCEDURE:  02/28/2003  DATE OF DISCHARGE:                                 OPERATIVE REPORT   PREOPERATIVE DIAGNOSIS:  Complex kickback crushing  injury to the left long  finger pulp with open fracture of distal  phalanx and explosion type injury  to the nail matrix and nail plate.   POSTOPERATIVE DIAGNOSIS:  Complex kickback crushing  injury to the left long  finger pulp with open fracture of distal  phalanx and explosion type injury  to the nail matrix and nail plate.   OPERATION:  1. Irrigation and debridement of open fracture of the left long finger     distal  phalanx.  2. Open reduction and internal fixation of left long finger distal  phalanx     with fixation by soft tissue reconstruction.  3. Repair of complex wound involving pulp, intermediate and dorsal nail     fold.  4. Graft reconstruction of nail matrix, left long finger  following     explosion type crushing injury.   SURGEON:  Katy Fitch. Sypher, M.D.   ASSISTANT:  Marveen Reeks. Dasnoit, P.A.-C   ANESTHESIA:  0.25% Marcaine and 2% Lidocaine, metacarpal head level block,  left long finger, no sedation. Note that this was performed in the minor  surgery room at  the [facility].   INDICATIONS FOR PROCEDURE:  [name] is a 55 year old employee of  [employer]. Earlier this morning he was cutting aluminum with a band-  saw type device when the  aluminum caught in the saw and kicked back,  sustaining a profound crushing  injury to his left long finger. He  had a  very untidy near amputation of  his pulp with an explosion injury to the  nail matrix with disruption of  the dorsal  intermediate and ventral nail  fold, avulsion of the nail plate and  a large segment of the ventral nail  fold,  a severely comminuted distal  phalanx  fracture and a near avulsion  of his pulp. His pulp remained viable due to the presence of a neurovascular  pedicle.   He was seen at the [facility] emergency room and  subsequently  a  hand surgery consult was requested. We were actively  engaged in surgery at  the [facility]  and had access for immediate use  of an operating room. He was transferred from the [facility]  emergency room to the day surgery center for definitive care. He was noted  to have a tetanus booster in June 2002.  DESCRIPTION OF PROCEDURE:  After informed consent was obtained he was  brought to the minor operating room. A 0.25% Marcaine and 2% Lidocaine  metacarpal level head level block was placed followed  by routine Betadine  scrub and paint of the left arm. The left long finger  was exsanguinated  with a gauze wrap and a 1/2 inch Penrose drain was placed over the proximal  phalangeal segment as a digital tourniquet.   The procedure commenced with an orderly debridement of his wound.  Devitalized tissue  was sharply debrided. Multiple fragments of aluminum  were removed with forceps and a needle driver. The wound was thoroughly  irrigated, followed  by inspection. The pulp was vital. There was a  circumferential girdling type injury with avulsion of the pulp and most of  the nail and nail plate. The nail plate was carefully  removed and the Glorious Peach  was used to extract a graft of ventral nail matrix that had been avulsed  with the explosion type injury. This was preserved on a bloody  field for  later reimplantation as a graft.   The distal  phalanx fracture was thoroughly debrided of clot and foreign  material followed  by open reduction to as an anatomic result as possible.  This was smashed like a crushed  cornflake. The wound was irrigated followed  by repair of  the pulp with mattress sutures of 5-0 nylon utilizing trauma  suture  technique.   The dorsal and intermediate nail fold as well as the ventral nail fold  fragments were assembled like a jigsaw puzzle, and a free graft was placed  to cover the distal  half of the ventral nail fold. The nail matrix graft  was placed in position with multiple  interrupted sutures of 6-0 chromic.  The lateral nail fold and nail wall was reconstructed with interrupted  sutures of 6-0 chromic and 5-0 nylon. The nail plate was cleaned and  replaced in an anatomic position  and sewn with simple suture of 5-0 nylon  maintaining a support to the reconstructed nail matrix.   The fingertip was reassembled to a near anatomic position. Due to  comminution of the pulp, there will probably be some  long-term broadening  of the finger  tip. There were no apparent complications.   [name]  tolerated  the surgery and anesthesia well. His digital  tourniquet was removed with immediate capillary refill in the finger. Then  then was dressed with Xeroflo, sterile gauze and Coban. An Alumafoam splint  was placed for postoperative protection.   He is discharged with prescriptions for Keflex 500 mg 1 p.o. q.8h. x5 days  as a prophylactic antibiotic and Percocet 5/325 1 to  2 tablets q.4-6h.  p.r.n. pain, 20 tablets without refill. He will return to our office for  follow up in 5 days.                                               Katy Fitch Naaman Plummer., M.D.    RVS/MEDQ  D:  02/28/2003  T:  02/28/2003  Job:  [id number]

## 2011-01-31 NOTE — Discharge Summary (Signed)
[place]. [facility]  Patient:    SHELL, YANDOW Visit Number: [id number] MRN: [id number]          Service Type: MED Location: [id number] Attending Physician:  Glennon Hamilton Dictated by:   Brita Romp, P.A. Adm. Date:  04/30/2001 Disc. Date: 05/06/2001   CC:         Nolon Nations, M.D., M.P.H.  Bruce Elvera Lennox Juanda Chance, M.D. [facility]   Discharge Summary  DISCHARGE DIAGNOSES: 1. Coronary artery disease, status post percutaneous coronary intervention    x 4. 2. Hyperlipidemia. 3. Gastroesophageal reflux disease. 4. Tobacco abuse.  HISTORY OF PRESENT ILLNESS:  Mr. Kantner is a 55 year old male who was seen in the office on April 28, 2001, as the result of an abnormal Cardiolite exam. After reviewing the Cardiolite, Dr. Juanda Chance felt that the patient needed to be admitted for cardiac catheterization.  HOSPITAL COURSE:  On April 30, 2001, the patient was taken to the catheterization laboratory by Dr. Graceann Congress.  Catheterization results: 1. Left anterior descending coronary artery:  Total occlusion after first    diagonal with collateral filling via the right coronary artery. 2. Circumflex:  A 90% occlusion in the AV circumflex after large OM. 3. Right coronary artery:  A 90% focal lesion in the mid vessel.  Dr. Jackey Loge plan was to discuss the feasibility of percutaneous intervention versus CABG with Dr. Juanda Chance.  That evening, after meeting with the patient and reviewing the films, Dr. Juanda Chance elected for a staged catheter PCI.  The next day, the patient was seen by Dr. Diona Browner.  He was doing well, had no chest pain or shortness of breath.  His groin was stable.  It was noted that his cholesterol was elevated with a total of 236, triglycerides 132, HDL of 30, and an LDL of 180.  As a result, he was started on Lipitor 20 mg q.p.m.  The next day, Dr. Diona Browner again saw the patient.  He noted he was now showing signs of bradycardia, with rates of 50-60.   As a result, he held the patients Lopressor.  On Monday, May 03, 2001, the patient was taken to the catheterization laboratory by Dr. Charlies Constable.  He opened the proximal lesion in the left anterior descending artery using guide wire distal protection and placed a stent.  He then performed angioplasty on the mid vessel lesion in the LAD.  He planned to intervene on the other two lesions in two days.  On May 05, 2001, the patient was doing well, had no chest pain, no shortness of breath.  He was taken back to the catheterization laboratory by Dr. Juanda Chance.  In the right coronary artery, he performed angioplasty and placed a stent, reducing the lesion from 90% down to 10%.  In the distal portion of the circumflex, he performed angioplasty and reduced a 90% lesion down to 20%. The patient tolerated the procedure well, and Dr. Juanda Chance anticipated discharge the following morning.  On May 06, 2001, the patient was seen by Dr. Graceann Congress.  Dr. Corinda Gubler felt the patient was doing well and was stable for discharge.  DISCHARGE MEDICATIONS: 1. Plavix 75 mg q.d. for four weeks. 2. Enteric-coated aspirin 325 mg q.d. 3. Nitroglycerin sublingually as needed. 4. Protonix 40 mg q.d. 5. Lipitor 20 mg q.p.m. 6. Toprol XL 25 mg q.d.  DISCHARGE INSTRUCTIONS: 1. The patient was advised to avoid driving, heavy lifting, sexual activity,    or tub baths for two days. 2.  He was advised to follow a low fat, low salt, low cholesterol diet. 3. He was to watch the catheterization site for any pain, bleeding, or    swelling, and call the [place] office if these problems. 4. He was advised to stop smoking. 5. He is to have a groin check appointment with the P.A. on May 20, 2001,    at 10:30. 6. He is to have a follow-up GXT Cardiolite exam on June 01, 2001, at    8 a.m. 7. He is to have an appointment with Dr. Juanda Chance on June 02, 2001, at    11:30 in the morning. 8. He is to follow  up with Dr. Corine Shelter as needed or scheduled.  LABORATORY DATA:  White count 7.2, hemoglobin 12.1, hematocrit 34.2, platelets 185.  Sodium 340, potassium 3.8, chloride 107, CO2 27, BUN 12, creatinine 1.2, glucose 107.  Chest x-ray revealed no active disease.  Electrocardiogram revealed sinus rhythm at 72 with some sinus arrhythmia.  PR interval 0.162, QRS 0.102, QTC 0.464, axis -20.  There was noted to be T-wave inversions in leads I, II, and leads V4 through 6. Dictated by:   Brita Romp, P.A. Attending Physician:  Glennon Hamilton DD:  05/06/01 TD:  05/07/01 Job: [id number]

## 2011-01-31 NOTE — Cardiovascular Report (Signed)
[place]. [facility]  Patient:    [name], [name] Visit Number: [id number] MRN: [id number]          Service Type: MED Location: 6500 [id number] Attending Physician:  Glennon Hamilton Dictated by:   Everardo Beals Juanda Chance, M.D. [facility] Proc. Date: 05/05/01 Adm. Date:  04/30/2001   CC:         Elvera Maria, M.D.  Cardiopulmonary Laboratory   Cardiac Catheterization  PROCEDURES PERFORMED:  Percutaneous coronary intervention.  CLINICAL HISTORY:  The patient is 55 years old and recently developed stable angina.  He was studied last week by Dr. Corinda Gubler and found to have a totally occluded LAD, as well as high-grade lesions in the right and distal circumflex artery.  After some discussion we decided to treat him with percutaneous intervention and the LAD was opened and stented two days ago.  We brought him back for treatment of the right and circumflex today.  DESCRIPTION OF PROCEDURE:  The procedure was performed via the left femoral artery using an arterial sheath and a JR4 6 [place] guiding catheter with side holes.  The patient was given weight-adjusted heparin to prolong the ACT to greater than 200 seconds and was given double bolus Integrilin in infusion. We used a luge wire and crossed the lesion in the mid right coronary artery without difficulty.  We direct stented with a 3.0 x 13 mm NIR deploying this with one inflation of 14 atmospheres for 30 seconds.  We then post-dilated with a 3.25 x 8 mm Quantum Ranger performing two inflations of 16 and 20 atmospheres for 30 seconds.  Repeat diagnostic studies were then performed though the guiding catheter.  We then turned out attention to the circumflex lesion.  We used a 4.0, 7 [place], Voda guiding catheter with side holes and the same luge wire.  We crossed the lesion in the distal circumflex artery without too much difficulty.  We attempted to cross a 2.25 x 15 mm Cutting Balloon across the lesion but were unsuccessful.   We also tried to inflate the Cutting Balloon as it was partially advanced through a lesion in an attempt to advance it all the way through the lesion but this was also unsuccessful.  We then predilated with a 2.0 x 20 mm Maverick performing one inflation of 8 atmospheres for 30 seconds.  We then repositioned the Cutting Balloon and performed three inflations up to 7 atmospheres for 45 seconds.  Repeat diagnostic studies were then performed through the guiding catheter.  The patient tolerated the procedure well and left the laboratory in satisfactory condition.  RESULTS:  The right coronary artery initially had a 90% stenosis in its midportion.  Following stenting, this improved to 10%.  The circumflex artery had a 95% stenosis in its distal portion and following Cutting Balloon angioplasty this improved to 20%.  CONCLUSIONS: 1. Successful stenting of the mid right coronary artery with improvement in    percent diameter narrowing from 90% to 10%. 2. Successful Cutting Balloon angioplasty of the distal circumflex lesion    with improvement in percent diameter narrowing from 95% to 20%.  DISPOSITION:  The patient was returned to the postangioplasty unit for further observation. Dictated by:   Everardo Beals Juanda Chance, M.D. [facility] Attending Physician:  Glennon Hamilton DD:  05/05/01 TD:  05/05/01 Job: [id number]

## 2011-06-26 LAB — BASIC METABOLIC PANEL
CO2: 27
Glucose, Bld: 110 — ABNORMAL HIGH
Potassium: 4.4
Sodium: 137

## 2011-06-26 LAB — CBC
HCT: 42.3
Hemoglobin: 14.6
MCHC: 34.4
RDW: 12.7

## 2011-06-26 LAB — DIFFERENTIAL
Basophils Absolute: 0
Basophils Relative: 0
Eosinophils Relative: 1
Monocytes Absolute: 0.7

## 2012-01-16 ENCOUNTER — Other Ambulatory Visit: Payer: Self-pay | Admitting: Cardiology

## 2012-03-02 ENCOUNTER — Encounter: Payer: Self-pay | Admitting: *Deleted

## 2012-03-07 ENCOUNTER — Other Ambulatory Visit: Payer: Self-pay | Admitting: Cardiology

## 2012-03-10 ENCOUNTER — Ambulatory Visit (INDEPENDENT_AMBULATORY_CARE_PROVIDER_SITE_OTHER): Payer: 59 | Admitting: Cardiology

## 2012-03-10 ENCOUNTER — Encounter: Payer: Self-pay | Admitting: *Deleted

## 2012-03-10 ENCOUNTER — Encounter: Payer: Self-pay | Admitting: Cardiology

## 2012-03-10 VITALS — BP 117/74 | HR 52 | Ht 69.0 in | Wt 255.0 lb

## 2012-03-10 DIAGNOSIS — I2510 Atherosclerotic heart disease of native coronary artery without angina pectoris: Secondary | ICD-10-CM

## 2012-03-10 DIAGNOSIS — G4733 Obstructive sleep apnea (adult) (pediatric): Secondary | ICD-10-CM

## 2012-03-10 DIAGNOSIS — E785 Hyperlipidemia, unspecified: Secondary | ICD-10-CM

## 2012-03-10 DIAGNOSIS — G4730 Sleep apnea, unspecified: Secondary | ICD-10-CM

## 2012-03-10 LAB — HEPATIC FUNCTION PANEL
ALT: 41 U/L (ref 0–53)
AST: 40 U/L — ABNORMAL HIGH (ref 0–37)
Bilirubin, Direct: 0.1 mg/dL (ref 0.0–0.3)
Total Bilirubin: 0.9 mg/dL (ref 0.3–1.2)

## 2012-03-10 LAB — LIPID PANEL: VLDL: 23 mg/dL (ref 0.0–40.0)

## 2012-03-10 LAB — BASIC METABOLIC PANEL
BUN: 15 mg/dL (ref 6–23)
CO2: 25 mEq/L (ref 19–32)
GFR: 86.35 mL/min (ref >60.00)
Glucose, Bld: 150 mg/dL — ABNORMAL HIGH (ref 70–99)
Potassium: 4.3 mEq/L (ref 3.5–5.1)

## 2012-03-10 MED ORDER — ATORVASTATIN CALCIUM 20 MG PO TABS: 20.0000 mg | ORAL_TABLET | Freq: Every day | ORAL | Status: DC

## 2012-03-10 MED ORDER — METOPROLOL SUCCINATE ER 25 MG PO TB24: ORAL_TABLET | ORAL | Status: DC

## 2012-03-10 NOTE — Patient Instructions (Addendum)
Decrease aspirin to 81mg  daily.  You can stop metoprolol succinate. If it does help your symptoms Dr Shirlee Latch wants you to start taking it again  You have been referred to [facility] pulmonary for management of sleep apnea and CPAP.   You have been referred to [facility] office to get established with a primary care doctor.  Your physician recommends that you return for a FASTING lipid profile /liver profile/BMET/Hgb A1C today.  Your physician wants you to follow-up in: 1 year with Dr Shirlee Latch. (June 2014). You will receive a reminder letter in the mail two months in advance. If you don't receive a letter, please call our office to schedule the follow-up appointment.

## 2012-03-10 NOTE — Assessment & Plan Note (Addendum)
No ischemic symptoms.  Continue ASA and Lipitor but can decrease ASA to 81 mg daily.  He has had some trouble with erectile dysfunction and wants to try stopping Toprol XL.  He is only on a very low dose due to sinus bradycardia.  He can go ahead and stop it to see if this helps.

## 2012-03-10 NOTE — Progress Notes (Signed)
Patient ID: [name], male   DOB: 03/15/1956, 56 y.o.   MRN: [id number] 56 yo with history of CAD s/p bare metal stenting to RCA and LAD and angioplasty to CFX in 2002 presents for cardiology followup.  He has done well since his last appointment in this office.  No chest pain or nitroglycerin use.  He denies exertional dyspnea.  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  He has had some problems with erectile dysfunction and is wondering if he can stop metoprolol.  He is not using CPAP but continues to have a lot of fatigue and sleepiness.  He is interested in trying it again (did not tolerate the CPAP mask, but this was over 5 years ago).   ECG: NSR at 52, normal  Labs (3/11): K 4.5, creatinine 1.1, LDL 50, HDL 33.5, TSH normal  PMH: 1. GERD 2. Hyperlipidemia 3. CAD: 2002 patient had BMS to LAD and RCA and PTCA to CFX.  Myoview in 2007 showed no ischemia.  4. 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  Married, lives in [place].  Quit smoking 2002.   FH: CAD  ROS: All systems reviewed and negative except as per HPI.   Current Outpatient Prescriptions  Medication Sig Dispense Refill  . atorvastatin (LIPITOR) 20 MG tablet Take 1 tablet (20 mg total) by mouth daily.  90 tablet  3  . NEXIUM 40 MG capsule TAKE 1 CAPSULE BY MOUTH DAILY BEFORE BREAKFAST  90 capsule  3  . nitroGLYCERIN (NITROSTAT) 0.4 MG SL tablet Place 1 tablet (0.4 mg total) under the tongue as needed.  100 tablet  3  . DISCONTD: aspirin 325 MG EC tablet Take by mouth daily.        Marland Kitchen DISCONTD: LIPITOR 20 MG tablet TAKE 1 TABLET EVERY DAY  90 tablet  2  . aspirin EC 81 MG tablet Take 1 tablet (81 mg total) by mouth daily.        BP 117/74  Pulse 52  Ht 5\' 9"  (1.753 m)  Wt 115.667 kg (255 lb)  BMI 37.66 kg/m2 General: NAD, obese Neck: Thick, No JVD, no thyromegaly or thyroid nodule.  Lungs: Clear to auscultation bilaterally with normal  respiratory effort. CV: Nondisplaced PMI.  Heart regular S1/S2, no S3/S4, no murmur.  No peripheral edema.  No carotid bruit.  Normal pedal pulses.  Abdomen: Soft, nontender, no hepatosplenomegaly, no distention.  Neurologic: Alert and oriented x 3.  Psych: Normal affect. Extremities: No clubbing or cyanosis.

## 2012-03-10 NOTE — Assessment & Plan Note (Addendum)
Check lipids with goal LDL < 70.   I will refer him to a PCP at [facility].

## 2012-03-10 NOTE — Assessment & Plan Note (Signed)
Wants to try CPAP again.  I will refer him back to pulmonary.  He needs diet and exercise to try to lose weight.

## 2012-04-21 ENCOUNTER — Ambulatory Visit: Payer: 59 | Admitting: Internal Medicine

## 2012-04-22 ENCOUNTER — Encounter: Payer: Self-pay | Admitting: Internal Medicine

## 2012-04-22 ENCOUNTER — Ambulatory Visit (INDEPENDENT_AMBULATORY_CARE_PROVIDER_SITE_OTHER): Payer: 59 | Admitting: Internal Medicine

## 2012-04-22 VITALS — BP 112/72 | HR 58 | Ht 69.0 in | Wt 242.8 lb

## 2012-04-22 DIAGNOSIS — G4733 Obstructive sleep apnea (adult) (pediatric): Secondary | ICD-10-CM

## 2012-04-22 NOTE — Patient Instructions (Addendum)
Order- DME CPAP autotitration 5-20 cwp x 7 days for pressure recommendation   Dx OSA  Sample Qnasl 1-2 puffs each nostril every day at bedtime.

## 2012-04-22 NOTE — Progress Notes (Signed)
04/22/12- 56 yoM to re-establish for Sleep Apnea,, referred at kind request of Dr. Shirlee Latch. NPSG 11/30/07- severe OSA, AHI 70/ hr, weight 260 lbs. He tried CPAP after his original sleep study but got frustrated by poor communication with the home care company, never got comfortable and dropped to CPAP. He is back because his wife complains of his snoring and tells him he stops breathing, and because his cardiologist is concerned. 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 Mouth breather. Told he does Dr. his legs. Medical history of some seasonal rhinitis and sneezing, surgical repair of nasal fracture/Dr. Haroldine Laws, coronary disease with stent 2002 but no MI. Quit smoking in 2002 after one pack per day for 15 years. Married, living with wife. No family history of sleep apnea. Father died age 37 unknown cause.  Prior to Admission medications   Medication Sig Start Date End Date Taking? Authorizing Provider  aspirin EC 81 MG tablet Take 1 tablet (81 mg total) by mouth daily. 03/10/12  Yes Laurey Morale, MD  atorvastatin (LIPITOR) 20 MG tablet Take 1 tablet (20 mg total) by mouth daily. 03/10/12  Yes Laurey Morale, MD  NEXIUM 40 MG capsule TAKE 1 CAPSULE BY MOUTH DAILY BEFORE BREAKFAST 01/16/12  Yes Laurey Morale, MD  nitroGLYCERIN (NITROSTAT) 0.4 MG SL tablet Place 1 tablet (0.4 mg total) under the tongue as needed. 01/29/11  Yes Laurey Morale, MD   Past Medical History  Diagnosis Date  . Other dyspnea and respiratory abnormality   . Esophageal reflux   . Other and unspecified hyperlipidemia   . Coronary atherosclerosis of unspecified type of vessel, native or graft     s/p 3 vessel PCI 2002  . Essential hypertension, benign     Controlled  . Obstructive sleep apnea (adult) (pediatric)    History reviewed. No pertinent past surgical history. Family History  Problem  Relation Age of Onset  . Lung cancer Father    History   Social History  . Marital Status: Married    Spouse Name: N/A    Number of Children: N/A  . Years of Education: N/A   Occupational History  . maintenance mechanic [employer]   Social History Main Topics  . Smoking status: Former Smoker -- 1.0 packs/day for 15 years    Types: Cigarettes    Quit date: 09/15/2000  . Smokeless tobacco: Not on file  . Alcohol Use: No  . Drug Use: No  . Sexually Active: Not on file   Other Topics Concern  . Not on file   Social History Narrative  . No narrative on file   ROS-see HPI Constitutional:   No- acute  weight loss, night sweats, fevers, chills, +fatigue, lassitude. HEENT:   No-  headaches, difficulty swallowing, tooth/dental problems, sore throat,       No-  sneezing, itching, ear ache, nasal congestion, post nasal drip,  CV:  No-   chest pain, orthopnea, PND, swelling in lower extremities, anasarca, dizziness, palpitations Resp: No-   shortness of breath with exertion or at rest.              No-   productive cough,  No non-productive cough,  No- coughing up of blood.              No-   change in color of mucus.  No- wheezing.  Skin: No-   rash or lesions. GI:  No-   heartburn, indigestion, abdominal pain, nausea, vomiting,  GU: No-   dysuria, change in color of urine, no urgency or frequency.  No- flank pain. MS:  No-   joint pain or swelling.  No- decreased range of motion.  No- back pain. Neuro-     nothing unusual Psych:  No- change in mood or affect. No depression or anxiety.  No memory loss.  OBJ- Physical Exam General- Alert, Oriented, Affect-appropriate, Distress- none acute, beard Skin- rash-none, lesions- none, excoriation- none Lymphadenopathy- none Head- atraumatic            Eyes- Gross vision intact, PERRLA, conjunctivae and secretions clear            Ears- Hearing, canals-normal            Nose- + turbinate edema, + narrow nasal airways with  external deviation of the nose and septum ,                         No-mucus, polyps, erosion, perforation             Throat- +Mallampati III , mucosa clear , drainage- none, tonsils- atrophic Neck- flexible , trachea midline, no stridor , thyroid nl, carotid no bruit Chest - symmetrical excursion , unlabored           Heart/CV- RRR , no murmur , no gallop  , no rub, nl s1 s2                           - JVD- none , edema- none, stasis changes- none, varices- none           Lung- clear to P&A, wheeze- none, cough- none , dullness-none, rub- none           Chest wall-  Abd- tender-no, distended-no, bowel sounds-present, HSM- no Br/ Gen/ Rectal- Not done, not indicated Extrem- cyanosis- none, clubbing, none, atrophy- none, strength- nl Neuro- grossly intact to observation

## 2012-04-26 ENCOUNTER — Telehealth: Payer: Self-pay | Admitting: Internal Medicine

## 2012-04-26 NOTE — Telephone Encounter (Signed)
Pt does not want to use [facility]

## 2012-04-26 NOTE — Telephone Encounter (Signed)
Called, spoke with pt who is requesting to speak to [facility] in regards to his DME.  Almyra Free, will you please call pt per his request?  Thank you.

## 2012-04-27 NOTE — Telephone Encounter (Signed)
Lm for pt to call [email or web address]  she is trying to get him so they can set him up on his new cpap [name]

## 2012-04-27 NOTE — Telephone Encounter (Signed)
Do we need to send another order for different DME. Please advise thanks

## 2012-04-27 NOTE — Telephone Encounter (Signed)
[name] from [place] will call pt and try to resolve his issue [name]

## 2012-04-29 NOTE — Assessment & Plan Note (Signed)
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 He is going to try CPAP again. There may be enough nasal congestion benefit from a trial of a nasal steroid. Plan - CPAP autotitration for pressure. Sample steroid nasal spray for trial.

## 2012-05-05 ENCOUNTER — Ambulatory Visit (INDEPENDENT_AMBULATORY_CARE_PROVIDER_SITE_OTHER): Payer: 59 | Admitting: Family Medicine

## 2012-05-05 ENCOUNTER — Encounter: Payer: Self-pay | Admitting: Family Medicine

## 2012-05-05 VITALS — BP 110/70 | HR 60 | Temp 97.6°F | Ht 68.5 in | Wt 238.0 lb

## 2012-05-05 DIAGNOSIS — E1165 Type 2 diabetes mellitus with hyperglycemia: Secondary | ICD-10-CM | POA: Insufficient documentation

## 2012-05-05 DIAGNOSIS — IMO0001 Reserved for inherently not codable concepts without codable children: Secondary | ICD-10-CM

## 2012-05-05 DIAGNOSIS — E785 Hyperlipidemia, unspecified: Secondary | ICD-10-CM

## 2012-05-05 DIAGNOSIS — E118 Type 2 diabetes mellitus with unspecified complications: Secondary | ICD-10-CM | POA: Insufficient documentation

## 2012-05-05 DIAGNOSIS — Z1211 Encounter for screening for malignant neoplasm of colon: Secondary | ICD-10-CM

## 2012-05-05 DIAGNOSIS — IMO0002 Reserved for concepts with insufficient information to code with codable children: Secondary | ICD-10-CM

## 2012-05-05 DIAGNOSIS — G4733 Obstructive sleep apnea (adult) (pediatric): Secondary | ICD-10-CM

## 2012-05-05 DIAGNOSIS — I2510 Atherosclerotic heart disease of native coronary artery without angina pectoris: Secondary | ICD-10-CM

## 2012-05-05 DIAGNOSIS — K219 Gastro-esophageal reflux disease without esophagitis: Secondary | ICD-10-CM

## 2012-05-05 NOTE — Assessment & Plan Note (Signed)
Followed by Dr. Maple Hudson.  Pending CPAP machine at home.

## 2012-05-05 NOTE — Assessment & Plan Note (Signed)
Chronic, stable. asxs. 

## 2012-05-05 NOTE — Assessment & Plan Note (Signed)
New dx, discussed this. Hopeful to control just with diet/weight loss. Return in 1-2 mo for physical, recheck A1c then, consider metformin.  Has lost 17lbs since last A1c.

## 2012-05-05 NOTE — Progress Notes (Signed)
Subjective:    Patient ID: [name], male    DOB: October 17, 1955, 56 y.o.   MRN: [id number]  HPI CC: new pt to establish  Prior saw [facility] (Dr. Birdie Sons) years ago.  Moved here 75.  Referral from Dr. 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  H/o HLD, CAD s/p 2 stents, OSA pending CPAP machine, and GERD controlled with nexium.  Recent dx diabetes 02/2012 with A1c 7.8%.  Has actually lost 17 lbs since dx!  Does not check sugars at home.  obesity - losing weight.  Decreased portion size, changed diet, decreased pepsi from 5 to 1 /day. Wt Readings from Last 3 Encounters:  05/05/12 238 lb (107.956 kg)  04/22/12 242 lb 12.8 oz (110.133 kg)  03/10/12 255 lb (115.667 kg)   Caffeine: 1 pepsi/day Lives with wife, 1 dog, 2 cats.  Grown daughter. Occupation: Curator Edu: HS Activity: work 12 hour days, gardens and fishes Diet: good water daily, fruits/vegetables daily  Preventative: Unsure last CPE No h/o colonoscopy.  Had flex sig years ago 1999, normal ([facility] by Dr. Birdie Sons).  No fmhx colon cancer. Tetanus 2012 Has not discussed prostate screening in past.  Medications and allergies reviewed and updated in chart.  Past histories reviewed and updated if relevant as below. Patient Active Problem List  Diagnosis  . HYPERLIPIDEMIA  . OBSTRUCTIVE SLEEP APNEA  . CORONARY ARTERY DISEASE  . GERD  . DYSPNEA ON EXERTION  . Diabetes type 2, uncontrolled   Past Medical History  Diagnosis Date  . Esophageal reflux   . HLD (hyperlipidemia)   . Coronary atherosclerosis of unspecified type of vessel, native or graft 2002    s/p 3 vessel PCI  . OSA (obstructive sleep apnea)     to get CPAP ([place])  . Diabetes type 2, uncontrolled    Past Surgical History  Procedure Date  . Coronary stent placement 04/2001    x2   History  Substance Use Topics  . Smoking status: Former Smoker -- 1.0 packs/day for 15 years    Types: Cigarettes      Quit date: 09/15/2000  . Smokeless tobacco: Never Used  . Alcohol Use: No   Family History  Problem Relation Age of Onset  . Lung cancer Father 27    smoker  . Hypertension Mother   . Diabetes Neg Hx   . Coronary artery disease Neg Hx   . Stroke Neg Hx    No Known Allergies Current Outpatient Prescriptions on File Prior to Visit  Medication Sig Dispense Refill  . aspirin EC 81 MG tablet Take 1 tablet (81 mg total) by mouth daily.      Marland Kitchen NEXIUM 40 MG capsule TAKE 1 CAPSULE BY MOUTH DAILY BEFORE BREAKFAST  90 capsule  3  . nitroGLYCERIN (NITROSTAT) 0.4 MG SL tablet Place 1 tablet (0.4 mg total) under the tongue as needed.  100 tablet  3  . DISCONTD: atorvastatin (LIPITOR) 20 MG tablet Take 1 tablet (20 mg total) by mouth daily.  90 tablet  3     Review of Systems  Constitutional: Negative for fever, chills, activity change, appetite change, fatigue and unexpected weight change.  HENT: Negative for hearing loss and neck pain.   Eyes: Negative for visual disturbance.  Respiratory: Negative for cough, chest tightness, shortness of breath and wheezing.   Cardiovascular: Negative for chest pain, palpitations and leg swelling.  Gastrointestinal: Negative for nausea, vomiting, abdominal pain, diarrhea, constipation,  blood in stool and abdominal distention.  Genitourinary: Negative for hematuria and difficulty urinating.  Musculoskeletal: Negative for myalgias and arthralgias.  Skin: Negative for rash.  Neurological: Negative for dizziness, seizures, syncope and headaches.  Hematological: Does not bruise/bleed easily.  Psychiatric/Behavioral: Negative for dysphoric mood. The patient is not nervous/anxious.        Objective:   Physical Exam  Nursing note and vitals reviewed. Constitutional: He is oriented to person, place, and time. He appears well-developed and well-nourished. No distress.  HENT:  Head: Normocephalic and atraumatic.  Right Ear: Hearing, tympanic membrane,  external ear and ear canal normal.  Left Ear: Hearing, tympanic membrane, external ear and ear canal normal.  Nose: Nose normal.  Mouth/Throat: Oropharynx is clear and moist. No oropharyngeal exudate.  Eyes: Conjunctivae and EOM are normal. Pupils are equal, round, and reactive to light. No scleral icterus.  Neck: Normal range of motion. Neck supple. No thyromegaly present.  Cardiovascular: Normal rate, regular rhythm, normal heart sounds and intact distal pulses.   No murmur heard. Pulses:      Radial pulses are 2+ on the right side, and 2+ on the left side.  Pulmonary/Chest: Effort normal and breath sounds normal. No respiratory distress. He has no wheezes. He has no rales.  Abdominal: Soft. Bowel sounds are normal. He exhibits no distension and no mass. There is no tenderness. There is no rebound and no guarding.  Musculoskeletal: Normal range of motion. He exhibits no edema.  Lymphadenopathy:    He has no cervical adenopathy.  Neurological: He is alert and oriented to person, place, and time.       CN grossly intact, station and gait intact  Skin: Skin is warm and dry. No rash noted.  Psychiatric: He has a normal mood and affect. His behavior is normal. Judgment and thought content normal.       Assessment & Plan:

## 2012-05-05 NOTE — Patient Instructions (Signed)
Return in 1-2 months for physical. We may get some blood work to check on sugars - you do have diabetes, but we hopefully will be able to control just with diet and weight loss. Good to meet you today. Stool kit today. Call us with quesitons.

## 2012-05-05 NOTE — Assessment & Plan Note (Signed)
Chronic, stable on nexium. 

## 2012-05-05 NOTE — Assessment & Plan Note (Signed)
Lab Results  Component Value Date   CHOL 128 03/10/2012   HDL 35.60* 03/10/2012   LDLCALC 69 03/10/2012   TRIG 115.0 03/10/2012   CHOLHDL 4 03/10/2012  chronic, stable. Continue lipitor.  Goal LDL <70, achieved.

## 2012-05-25 ENCOUNTER — Other Ambulatory Visit: Payer: 59

## 2012-05-25 ENCOUNTER — Other Ambulatory Visit: Payer: Self-pay | Admitting: Family Medicine

## 2012-05-25 DIAGNOSIS — R195 Other fecal abnormalities: Secondary | ICD-10-CM

## 2012-05-25 DIAGNOSIS — Z1211 Encounter for screening for malignant neoplasm of colon: Secondary | ICD-10-CM

## 2012-05-26 ENCOUNTER — Encounter: Payer: Self-pay | Admitting: Internal Medicine

## 2012-06-03 ENCOUNTER — Ambulatory Visit: Payer: 59 | Admitting: Internal Medicine

## 2012-06-10 ENCOUNTER — Ambulatory Visit (INDEPENDENT_AMBULATORY_CARE_PROVIDER_SITE_OTHER): Payer: 59 | Admitting: Internal Medicine

## 2012-06-10 ENCOUNTER — Encounter: Payer: Self-pay | Admitting: Internal Medicine

## 2012-06-10 VITALS — BP 126/82 | HR 56 | Ht 69.0 in | Wt 242.4 lb

## 2012-06-10 DIAGNOSIS — G4733 Obstructive sleep apnea (adult) (pediatric): Secondary | ICD-10-CM

## 2012-06-10 DIAGNOSIS — J309 Allergic rhinitis, unspecified: Secondary | ICD-10-CM

## 2012-06-10 DIAGNOSIS — J302 Other seasonal allergic rhinitis: Secondary | ICD-10-CM

## 2012-06-10 MED ORDER — AZELASTINE-FLUTICASONE 137-50 MCG/ACT NA SUSP: 2.0000 | Freq: Every day | NASAL | Status: DC

## 2012-06-10 NOTE — Patient Instructions (Addendum)
I recommend the flu shot if you change your mind  We can continue your CPAP set with AutoPAP self adjusting pressure for now.  Our goal is all night every night CPAP use. If it isn't comfortable let [facility] know.  Sample Dymista or Qnasl nasal spray for drainage      1-2 puff each nostril once e very day at bedtime

## 2012-06-10 NOTE — Progress Notes (Signed)
04/22/12- 56 yoM to re-establish for Sleep Apnea,, referred at kind request of Dr. Shirlee Latch. NPSG 11/30/07- severe OSA, AHI 70/ hr, weight 260 lbs. He tried CPAP after his original sleep study but got frustrated by poor communication with the home care company, never got comfortable and dropped to CPAP. He is back because his wife complains of his snoring and tells him he stops breathing, and because his cardiologist is concerned. 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 Mouth breather. Told he does Dr. his legs. Medical history of some seasonal rhinitis and sneezing, surgical repair of nasal fracture/Dr. Haroldine Laws, coronary disease with stent 2002 but no MI. Quit smoking in 2002 after one pack per day for 15 years. Married, living with wife. No family history of sleep apnea. Father died age 1 unknown cause.  07/08/2012- 56 yoM to re-establish for Sleep Apnea complicated by allergic rhinitis, CAD, GERD,  Wears CPAP/ autoPAP/ [facility] every night for about the last 12 nights for about 5-6 hours. Initially difficult start with CPAP but now he feels comfortable. He blamed the mask for "blisters" on his face which are now resolved. He continues autotitration. Full face mask. Wants to wait on flu vaccine. Some postnasal drip Watery nose.  ROS-see HPI Constitutional:   No- acute  weight loss, night sweats, fevers, chills, +fatigue, lassitude. HEENT:   No-  headaches, difficulty swallowing, tooth/dental problems, sore throat,       No-  sneezing, itching, ear ache, nasal congestion, +post nasal drip,  CV:  No-   chest pain, orthopnea, PND, swelling in lower extremities, anasarca, dizziness, palpitations Resp: No-   shortness of breath with exertion or at rest.              No-   productive cough,  No non-productive cough,  No- coughing up of blood.              No-   change in color of  mucus.  No- wheezing.   Skin: No-   rash or lesions. GI:  No-   heartburn, indigestion, abdominal pain, nausea, vomiting,  GU: . MS:  No-   joint pain or swelling. Neuro-     nothing unusual Psych:  No- change in mood or affect. No depression or anxiety.  No memory loss.  OBJ- Physical Exam General- Alert, Oriented, Affect-appropriate, Distress- none acute, beard. Stocky body build Skin- rash-none, lesions- none, excoriation- none Lymphadenopathy- none Head- atraumatic            Eyes- Gross vision intact, PERRLA, conjunctivae and secretions clear            Ears- Hearing, canals-normal            Nose- + turbinate edema, + narrow nasal airways with +external deviation of the nose and septum ,                         No-mucus, polyps, erosion, perforation             Throat- +Mallampati III-IV , mucosa clear , drainage- none, tonsils- atrophic Neck- flexible , trachea midline, no stridor , thyroid nl, carotid no bruit Chest - symmetrical excursion , unlabored           Heart/CV- RRR , no murmur , no gallop  , no rub, nl s1 s2                           -  JVD- none , edema- none, stasis changes- none, varices- none           Lung- clear to P&A, wheeze- none, cough- none , dullness-none, rub- none           Chest wall-  Abd-  Br/ Gen/ Rectal- Not done, not indicated Extrem- cyanosis- none, clubbing, none, atrophy- none, strength- nl Neuro- grossly intact to observation

## 2012-06-15 HISTORY — PX: COLONOSCOPY: SHX174

## 2012-06-16 ENCOUNTER — Encounter: Payer: Self-pay | Admitting: Internal Medicine

## 2012-06-20 DIAGNOSIS — J302 Other seasonal allergic rhinitis: Secondary | ICD-10-CM | POA: Insufficient documentation

## 2012-06-20 NOTE — Assessment & Plan Note (Signed)
Settling into good CPAP compliance and control. Plan- continue autotitration for now.

## 2012-06-20 NOTE — Assessment & Plan Note (Signed)
Plan- sample Dymista nasal spray with discussion

## 2012-06-21 ENCOUNTER — Ambulatory Visit (AMBULATORY_SURGERY_CENTER): Payer: 59 | Admitting: *Deleted

## 2012-06-21 ENCOUNTER — Encounter: Payer: Self-pay | Admitting: Internal Medicine

## 2012-06-21 VITALS — Ht 69.0 in | Wt 239.0 lb

## 2012-06-21 DIAGNOSIS — Z1211 Encounter for screening for malignant neoplasm of colon: Secondary | ICD-10-CM

## 2012-06-21 MED ORDER — SUPREP BOWEL PREP KIT 17.5-3.13-1.6 GM/177ML PO SOLN: ORAL | Status: DC

## 2012-06-22 ENCOUNTER — Other Ambulatory Visit: Payer: Self-pay | Admitting: Cardiology

## 2012-06-24 ENCOUNTER — Other Ambulatory Visit: Payer: Self-pay | Admitting: Family Medicine

## 2012-06-24 DIAGNOSIS — E785 Hyperlipidemia, unspecified: Secondary | ICD-10-CM

## 2012-06-24 DIAGNOSIS — IMO0002 Reserved for concepts with insufficient information to code with codable children: Secondary | ICD-10-CM

## 2012-06-24 DIAGNOSIS — E1165 Type 2 diabetes mellitus with hyperglycemia: Secondary | ICD-10-CM

## 2012-06-30 ENCOUNTER — Other Ambulatory Visit (INDEPENDENT_AMBULATORY_CARE_PROVIDER_SITE_OTHER): Payer: 59

## 2012-06-30 DIAGNOSIS — E785 Hyperlipidemia, unspecified: Secondary | ICD-10-CM

## 2012-06-30 DIAGNOSIS — IMO0001 Reserved for inherently not codable concepts without codable children: Secondary | ICD-10-CM

## 2012-06-30 DIAGNOSIS — IMO0002 Reserved for concepts with insufficient information to code with codable children: Secondary | ICD-10-CM

## 2012-06-30 DIAGNOSIS — E1165 Type 2 diabetes mellitus with hyperglycemia: Secondary | ICD-10-CM

## 2012-06-30 LAB — BASIC METABOLIC PANEL
CO2: 29 mEq/L (ref 19–32)
Calcium: 9.1 mg/dL (ref 8.4–10.5)
GFR: 76.94 mL/min (ref >60.00)
Sodium: 140 mEq/L (ref 135–145)

## 2012-06-30 LAB — HEMOGLOBIN A1C: Hgb A1c MFr Bld: 6.2 % (ref 4.6–6.5)

## 2012-06-30 LAB — MICROALBUMIN / CREATININE URINE RATIO
Microalb Creat Ratio: 0.2 mg/g (ref 0.0–30.0)
Microalb, Ur: 0.5 mg/dL (ref 0.0–1.9)

## 2012-07-05 ENCOUNTER — Ambulatory Visit (AMBULATORY_SURGERY_CENTER): Payer: 59 | Admitting: Internal Medicine

## 2012-07-05 ENCOUNTER — Encounter: Payer: 59 | Admitting: Family Medicine

## 2012-07-05 ENCOUNTER — Encounter: Payer: Self-pay | Admitting: Internal Medicine

## 2012-07-05 VITALS — BP 135/74 | HR 53 | Temp 98.1°F | Resp 20 | Ht 69.0 in | Wt 239.0 lb

## 2012-07-05 DIAGNOSIS — D126 Benign neoplasm of colon, unspecified: Secondary | ICD-10-CM

## 2012-07-05 DIAGNOSIS — Z1211 Encounter for screening for malignant neoplasm of colon: Secondary | ICD-10-CM

## 2012-07-05 DIAGNOSIS — K621 Rectal polyp: Secondary | ICD-10-CM

## 2012-07-05 DIAGNOSIS — K620 Anal polyp: Secondary | ICD-10-CM

## 2012-07-05 MED ORDER — SODIUM CHLORIDE 0.9 % IV SOLN: 500.0000 mL | INTRAVENOUS | Status: DC

## 2012-07-05 NOTE — Progress Notes (Addendum)
Propofol per Knute Neu CRNA, see scanned intra procedure report. ewm  [phone] IV noted to e puffy at the sight and CRNA having difficulty pushing propofol. Pt beginning to awaken and move in bed. Iv site without redness butr noted to be infiltrated. Iv fluids stopped, md made aware. Pt then began to wake, in drowsy state and notified his iv wasn't working. Pt encouraged to take deep breaths, informed as procedure progressed what he would feel, etc and did fine with remaining procedure. Pt had no complaints of pain or discomfort, talked with crna at the end with no problems noted. ewm

## 2012-07-05 NOTE — Progress Notes (Signed)
IV site warm, slight amt of edema noted.  No redness and area soft.  Pt states area "burns a little bit"  Patient did not experience any of the following events: a burn prior to discharge; a fall within the facility; wrong site/side/patient/procedure/implant event; or a hospital transfer or hospital admission upon discharge from the facility. [phone]) Patient did not have preoperative order for IV antibiotic SSI prophylaxis. [phone])

## 2012-07-05 NOTE — Op Note (Signed)
[facility] [address] [place], [address]   COLONOSCOPY PROCEDURE REPORT  PATIENT: [name], [name]  MR#: [id number] BIRTHDATE: Nov 20, 1955 , 56  yrs. old GENDER: Male ENDOSCOPIST: Beverley Fiedler, MD REFERRED [id number], Wynona Canes PROCEDURE DATE:  07/05/2012 PROCEDURE:   Colonoscopy with snare polypectomy and Colonoscopy with cold biopsy polypectomy ASA CLASS:   Class II INDICATIONS:average risk screening, heme-positive stool, and first colonoscopy. MEDICATIONS: MAC sedation, administered by CRNA and Propofol (Diprivan) 280 mg IV  DESCRIPTION OF PROCEDURE:   After the risks benefits and alternatives of the procedure were thoroughly explained, informed consent was obtained.  A digital rectal exam revealed no rectal mass.   The LB [id number]  endoscope was introduced through the anus and advanced to the cecum, which was identified by both the appendix and ileocecal valve. No adverse events experienced. The quality of the prep was Suprep good  The instrument was then slowly withdrawn as the colon was fully examined.      COLON FINDINGS: Ten sessile polyps ranging between 3-37mm in size were found in the ascending colon (2), transverse colon (3), descending colon (2), and sigmoid colon (3).  Polypectomy was performed with cold forceps (1), using cold snare (3) and using hot snare (6).  All resections were complete and all polyp tissue was completely retrieved.   A pedunculated polyp measuring 3 cm in size was found in the sigmoid colon.  A polypectomy was performed using snare cautery.  The resection was complete and the polyp tissue was completely retrieved.   Three sessile polyps ranging between 3-43mm in size were found in the rectum.  Polypectomy was performed with cold forceps (1) and using cold snare (2).  All resections were complete and all polyp tissue was completely retrieved.   Mild diverticulosis was noted in the sigmoid colon.  Retroflexed  views revealed no abnormalities. The time to cecum=2 minutes 00 seconds. Withdrawal time=28 minutes 50 seconds.  The scope was withdrawn and the procedure completed.  COMPLICATIONS: There were no complications.  ENDOSCOPIC IMPRESSION: 1.   Ten sessile polyps ranging between 3-20mm in size were found in the ascending colon, transverse colon, descending colon, and sigmoid colon; Polypectomy was performed with cold forceps, using cold snare and using hot snare 2.   Pedunculated polyp measuring 3 cm in size was found in the sigmoid colon; polypectomy was performed using snare cautery 3.   Three sessile polyps ranging between 3-29mm in size were found in the rectum; Polypectomy was performed with cold forceps and using cold snare 4.   Mild diverticulosis was noted in the sigmoid colon  RECOMMENDATIONS: 1.  Hold aspirin, aspirin products, and anti-inflammatory medication for 2 weeks. 2.  Await pathology results 3.  High fiber diet 4.  Timing of repeat colonoscopy will be determined by pathology findings. 5.  You will receive a letter within 1-2 weeks with the results of your biopsy as well as final recommendations.  Please call my office if you have not received a letter after 3 weeks.   eSigned:  Beverley Fiedler, MD 07/05/2012 9:09 AM   cc: Eustaquio Boyden MD and The Patient   PATIENT NAME:  [name], [name] MR#: [id number]

## 2012-07-05 NOTE — Progress Notes (Signed)
The pt stated he has an umbilical hernia.  I advised him to tell Dr. Rhea Belton and the nurse anesthisist this when he gets in the procedure room.  He stated he would. Maw

## 2012-07-05 NOTE — Patient Instructions (Addendum)

## 2012-07-06 ENCOUNTER — Telehealth: Payer: Self-pay

## 2012-07-06 NOTE — Telephone Encounter (Signed)
  Follow up Call-  Call back number 07/05/2012  Post procedure Call Back phone  # [phone] hm  Permission to leave phone message Yes     Patient questions:  Do you have a fever, pain , or abdominal swelling? no Pain Score  0 *  Have you tolerated food without any problems? yes  Have you been able to return to your normal activities? yes  Do you have any questions about your discharge instructions: Diet   no Medications  no Follow up visit  no  Do you have questions or concerns about your Care? no  Actions: * If pain score is 4 or above: No action needed, pain <4.  Per the pt, "I had BRB from the rectum not long after I got home".  The pt said the bleeding had subsided and there were no other problems. Maw

## 2012-07-07 ENCOUNTER — Encounter: Payer: Self-pay | Admitting: Family Medicine

## 2012-07-07 ENCOUNTER — Ambulatory Visit (INDEPENDENT_AMBULATORY_CARE_PROVIDER_SITE_OTHER): Payer: 59 | Admitting: Family Medicine

## 2012-07-07 VITALS — BP 124/72 | HR 56 | Temp 97.8°F | Ht 69.0 in | Wt 237.2 lb

## 2012-07-07 DIAGNOSIS — D126 Benign neoplasm of colon, unspecified: Secondary | ICD-10-CM

## 2012-07-07 DIAGNOSIS — Z0000 Encounter for general adult medical examination without abnormal findings: Secondary | ICD-10-CM

## 2012-07-07 DIAGNOSIS — E1165 Type 2 diabetes mellitus with hyperglycemia: Secondary | ICD-10-CM

## 2012-07-07 DIAGNOSIS — I2510 Atherosclerotic heart disease of native coronary artery without angina pectoris: Secondary | ICD-10-CM

## 2012-07-07 DIAGNOSIS — K635 Polyp of colon: Secondary | ICD-10-CM | POA: Insufficient documentation

## 2012-07-07 DIAGNOSIS — IMO0002 Reserved for concepts with insufficient information to code with codable children: Secondary | ICD-10-CM

## 2012-07-07 DIAGNOSIS — IMO0001 Reserved for inherently not codable concepts without codable children: Secondary | ICD-10-CM

## 2012-07-07 DIAGNOSIS — Z125 Encounter for screening for malignant neoplasm of prostate: Secondary | ICD-10-CM

## 2012-07-07 DIAGNOSIS — E785 Hyperlipidemia, unspecified: Secondary | ICD-10-CM

## 2012-07-07 NOTE — Assessment & Plan Note (Signed)
New dx - awaiting pathology Dr. Rhea Belton.

## 2012-07-07 NOTE — Assessment & Plan Note (Signed)
Chronic, stable. reviewed LDL with pt.  Good control. continue lipitor.

## 2012-07-07 NOTE — Assessment & Plan Note (Signed)
Preventative protocols reviewed and updated unless pt declined. Discussed healthy diet and lifestyle.  

## 2012-07-07 NOTE — Progress Notes (Signed)
Subjective:    Patient ID: [name], male    DOB: 07-27-1956, 56 y.o.   MRN: [id number]  HPI CC: annual exam  Had colonoscopy 06/2012  DM - checks fasting sugars a few times a week.  Running low 100s.  Due for vision exam. Lab Results  Component Value Date   HGBA1C 6.2 06/30/2012    BP Readings from Last 3 Encounters:  07/07/12 124/72  07/05/12 135/74  06/10/12 126/82    Wt Readings from Last 3 Encounters:  07/07/12 237 lb 4 oz (107.616 kg)  07/05/12 239 lb (108.41 kg)  06/21/12 239 lb (108.41 kg)   Preventative:  Colonoscopy 07/05/2012 - 15 polyps removed.  Mild diverticulosis. Tetanus 2012  Flu shot - declines Prostate cancer - see below Urologist - Dr. Aldean Ast.  Seen years ago for bladder emptying problems. Told PSA was normal in past.  Would like PSA checked next blood draw.  Discussed screening - would like to continue.  No DRE today given recent colonsocopy  Caffeine: 1 pepsi/day Lives with wife, 1 dog, 2 cats.  Grown daughter. Occupation: Curator Edu: [employer] Activity: work 12 hour days, gardens and fishes Diet: good water daily, fruits/vegetables daily  Seat belt use discussed. Sunscreen use discussed.  Medications and allergies reviewed and updated in chart.  Past histories reviewed and updated if relevant as below. Patient Active Problem List  Diagnosis  . HYPERLIPIDEMIA  . OBSTRUCTIVE SLEEP APNEA  . CORONARY ARTERY DISEASE  . GERD  . DYSPNEA ON EXERTION  . Diabetes type 2, uncontrolled  . Seasonal and perennial allergic rhinitis   Past Medical History  Diagnosis Date  . Esophageal reflux   . HLD (hyperlipidemia)   . Coronary atherosclerosis of unspecified type of vessel, native or graft 2002    s/p 3 vessel PCI  . OSA (obstructive sleep apnea)      CPAP ([facility])  . Umbilical hernia   . Diabetes type 2, controlled     diet control-no meds.   Past Surgical History  Procedure Date  . Coronary stent placement 04/2001    x2  . Finger  fracture surgery 2004    rt middle finger  . Anal fissure repair 2008  . Sigmoidoscopy 1996  . Colonoscopy 06/2012    15 polyps, mild diverticulosis ([facility])   History  Substance Use Topics  . Smoking status: Former Smoker -- 1.0 packs/day for 15 years    Types: Cigarettes    Quit date: 09/15/2000  . Smokeless tobacco: Never Used  . Alcohol Use: No   Family History  Problem Relation Age of Onset  . Lung cancer Father 93    smoker  . Hypertension Mother   . Diabetes Neg Hx   . Coronary artery disease Neg Hx   . Stroke Neg Hx   . Colon cancer Neg Hx   . Esophageal cancer Neg Hx   . Rectal cancer Neg Hx   . Stomach cancer Neg Hx    No Known Allergies Current Outpatient Prescriptions on File Prior to Visit  Medication Sig Dispense Refill  . aspirin EC 81 MG tablet Take 1 tablet (81 mg total) by mouth daily.      Marland Kitchen atorvastatin (LIPITOR) 20 MG tablet Take 20 mg by mouth at bedtime.      . Azelastine-Fluticasone (DYMISTA) 137-50 MCG/ACT SUSP Place 2 sprays into both nostrils at bedtime.  1 Bottle  0  . Coenzyme Q10 (CO Q 10 PO) Take 200 mg by mouth daily.      Marland Kitchen  Multiple Vitamin (MULTIVITAMIN) tablet Take 1 tablet by mouth daily.      Marland Kitchen NEXIUM 40 MG capsule TAKE 1 CAPSULE BY MOUTH DAILY BEFORE BREAKFAST  90 capsule  3  . NITROSTAT 0.4 MG SL tablet PLACE 1 TABLET UNDER THE TONGUE AS NEEDED.  100 tablet  0  . Probiotic Product (PROBIOTIC DAILY PO) Take 1 tablet by mouth daily.         Review of Systems  Constitutional: Negative for fever, chills, activity change, appetite change, fatigue and unexpected weight change.  HENT: Negative for hearing loss and neck pain.   Eyes: Negative for visual disturbance.  Respiratory: Negative for cough, chest tightness, shortness of breath and wheezing.   Cardiovascular: Negative for chest pain, palpitations and leg swelling.  Gastrointestinal: Negative for nausea, vomiting, abdominal pain, diarrhea, constipation, blood in stool and abdominal  distention.  Genitourinary: Negative for hematuria and difficulty urinating.  Musculoskeletal: Negative for myalgias and arthralgias.  Skin: Negative for rash.  Neurological: Negative for dizziness, seizures, syncope and headaches.  Hematological: Does not bruise/bleed easily.  Psychiatric/Behavioral: Negative for dysphoric mood. The patient is not nervous/anxious.        Objective:   Physical Exam  Nursing note and vitals reviewed. Constitutional: He is oriented to person, place, and time. He appears well-developed and well-nourished. No distress.  HENT:  Head: Normocephalic and atraumatic.  Right Ear: Hearing, tympanic membrane, external ear and ear canal normal.  Left Ear: Hearing, tympanic membrane, external ear and ear canal normal.  Nose: Nose normal.  Mouth/Throat: Oropharynx is clear and moist. No oropharyngeal exudate.  Eyes: Conjunctivae normal and EOM are normal. Pupils are equal, round, and reactive to light. No scleral icterus.  Neck: Normal range of motion. Neck supple.  Cardiovascular: Normal rate, regular rhythm, normal heart sounds and intact distal pulses.   No murmur heard. Pulses:      Radial pulses are 2+ on the right side, and 2+ on the left side.  Pulmonary/Chest: Effort normal and breath sounds normal. No respiratory distress. He has no wheezes. He has no rales.  Abdominal: Soft. Bowel sounds are normal. He exhibits no distension and no mass. There is no tenderness. There is no rebound and no guarding.       obese  Genitourinary:       Deferred after recent colonsocopy  Musculoskeletal: Normal range of motion. He exhibits no edema.  Lymphadenopathy:    He has no cervical adenopathy.  Neurological: He is alert and oriented to person, place, and time.       CN grossly intact, station and gait intact  Skin: Skin is warm and dry. No rash noted.  Psychiatric: He has a normal mood and affect. His behavior is normal. Judgment and thought content normal.         Assessment & Plan:

## 2012-07-07 NOTE — Assessment & Plan Note (Signed)
Chronic, stable. Has lost 17 lbs since initial dx. Great control with diet/lifestyle changes. rtc 6 mo for recheck A1c and office visit.

## 2012-07-07 NOTE — Assessment & Plan Note (Signed)
Sees Dr. Shirlee Latch yearly.

## 2012-07-07 NOTE — Patient Instructions (Signed)
Good to see you today, call us with questions. Return in 6 months for recheck diabetes, prior for blood work (PSA and A1c). Return sooner if needed.

## 2012-07-08 ENCOUNTER — Encounter: Payer: Self-pay | Admitting: Internal Medicine

## 2012-07-08 ENCOUNTER — Encounter: Payer: Self-pay | Admitting: Family Medicine

## 2012-08-31 ENCOUNTER — Encounter: Payer: Self-pay | Admitting: Family Medicine

## 2012-08-31 ENCOUNTER — Ambulatory Visit (INDEPENDENT_AMBULATORY_CARE_PROVIDER_SITE_OTHER): Payer: 59 | Admitting: Family Medicine

## 2012-08-31 ENCOUNTER — Encounter: Payer: Self-pay | Admitting: *Deleted

## 2012-08-31 VITALS — BP 124/78 | HR 68 | Temp 97.9°F | Wt 244.0 lb

## 2012-08-31 DIAGNOSIS — J019 Acute sinusitis, unspecified: Secondary | ICD-10-CM

## 2012-08-31 NOTE — Progress Notes (Signed)
  Subjective:    Patient ID: [name], male    DOB: [birth year], 56 y.o.   MRN: [id number]  HPI CC: cold  1d h/o sinus congestion, now today getting worse.  + sinus pressure HA today.  Getting more congested as day goes on.  + PNdrainage starting, sneezing.  Throat feels sore, started today.  Has been cleaning mold in basement.  So far has taken OTC meds - tylenol, nyquil, benadryl  No fevers/chills, abd pain, ear or tooth pain, coughing.  No sick contacts at home.  + coworker sick No smokers at home. No h/o asthma, COPD. H/o GERD, OSA, controlled T2DM.  Did not get flu shot this year.  Past Medical History  Diagnosis Date  . Esophageal reflux   . HLD (hyperlipidemia)   . Coronary atherosclerosis of unspecified type of vessel, native or graft 2002    s/p 3 vessel PCI  . OSA (obstructive sleep apnea)      CPAP ([facility])  . Umbilical hernia   . Diabetes type 2, controlled     diet control-no meds.     Review of Systems Per HPI    Objective:   Physical Exam  Nursing note and vitals reviewed. Constitutional: He appears well-developed and well-nourished. No distress.  HENT:  Head: Normocephalic and atraumatic.  Right Ear: Hearing, tympanic membrane, external ear and ear canal normal.  Left Ear: Hearing, tympanic membrane, external ear and ear canal normal.  Nose: Mucosal edema and rhinorrhea present. Right sinus exhibits no maxillary sinus tenderness and no frontal sinus tenderness. Left sinus exhibits no maxillary sinus tenderness and no frontal sinus tenderness.  Mouth/Throat: Uvula is midline and mucous membranes are normal. Posterior oropharyngeal erythema present. No oropharyngeal exudate, posterior oropharyngeal edema or tonsillar abscesses.       Sinus pressure throughout. R>L turbinate swelling present  Eyes: Conjunctivae normal and EOM are normal. Pupils are equal, round, and reactive to light. No scleral icterus.  Neck: Normal range of motion. Neck supple.   Cardiovascular: Normal rate, regular rhythm, normal heart sounds and intact distal pulses.   No murmur heard. Pulmonary/Chest: Effort normal and breath sounds normal. No respiratory distress. He has no wheezes. He has no rales.  Lymphadenopathy:    He has no cervical adenopathy.  Skin: Skin is warm and dry. No rash noted.       Assessment & Plan:

## 2012-08-31 NOTE — Patient Instructions (Addendum)
Sounds like you have a viral upper respiratory infection or sinusitis.  Most sinus infections are viral, at least when they start Antibiotics are not needed for this.  Viral infections usually take 7-10 days to resolve.  The cough can last several weeks to go away. May try ibuprofen 400-600mg  twice daily for next few days. Take simple mucinex or guaifenesin with plenty of water to mobilize mucous out. Push fluids and plenty of rest. Please let me know if you are not improving as expected, or if you have high fevers (>101.5) or difficulty swallowing or worsening productive cough, or sinus congestion prolonged past 10 days. Call clinic with questions.  Good to see you today.

## 2012-08-31 NOTE — Assessment & Plan Note (Signed)
Anticipate viral given short duration. Discussed this. Supportive care as per instructions. Update [place] if sxs persist past 10 days or any worsening in interim.

## 2012-12-08 ENCOUNTER — Ambulatory Visit: Payer: 59 | Admitting: Internal Medicine

## 2012-12-29 ENCOUNTER — Other Ambulatory Visit: Payer: 59

## 2013-01-04 ENCOUNTER — Other Ambulatory Visit: Payer: Self-pay | Admitting: Cardiology

## 2013-01-05 ENCOUNTER — Ambulatory Visit: Payer: 59 | Admitting: Family Medicine

## 2013-03-07 ENCOUNTER — Ambulatory Visit (INDEPENDENT_AMBULATORY_CARE_PROVIDER_SITE_OTHER): Payer: 59 | Admitting: Cardiology

## 2013-03-07 ENCOUNTER — Encounter: Payer: Self-pay | Admitting: Cardiology

## 2013-03-07 VITALS — BP 130/70 | HR 58 | Ht 69.0 in | Wt 245.0 lb

## 2013-03-07 DIAGNOSIS — E785 Hyperlipidemia, unspecified: Secondary | ICD-10-CM

## 2013-03-07 DIAGNOSIS — I2510 Atherosclerotic heart disease of native coronary artery without angina pectoris: Secondary | ICD-10-CM

## 2013-03-07 LAB — BASIC METABOLIC PANEL
BUN: 15 mg/dL (ref 6–23)
Calcium: 9.2 mg/dL (ref 8.4–10.5)
Chloride: 106 mEq/L (ref 96–112)
Creatinine, Ser: 1 mg/dL (ref 0.4–1.5)

## 2013-03-07 LAB — HEPATIC FUNCTION PANEL
ALT: 44 U/L (ref 0–53)
AST: 34 U/L (ref 0–37)
Alkaline Phosphatase: 68 U/L (ref 39–117)
Bilirubin, Direct: 0.1 mg/dL (ref 0.0–0.3)
Total Protein: 8.2 g/dL (ref 6.0–8.3)

## 2013-03-07 LAB — LIPID PANEL
Cholesterol: 112 mg/dL (ref 0–200)
LDL Cholesterol: 60 mg/dL (ref 0–99)

## 2013-03-07 MED ORDER — METOPROLOL SUCCINATE ER 25 MG PO TB24: 12.5000 mg | ORAL_TABLET | Freq: Every day | ORAL | Status: DC

## 2013-03-07 MED ORDER — NITROGLYCERIN 0.4 MG SL SUBL: SUBLINGUAL_TABLET | SUBLINGUAL | Status: DC

## 2013-03-07 NOTE — Patient Instructions (Addendum)
Your physician recommends that you have  lab work today--Lipid profile/liver profile/BMET.  Your physician wants you to follow-up in: 1 year with Dr Shirlee Latch. (June 2015).  You will receive a reminder letter in the mail two months in advance. If you don't receive a letter, please call our office to schedule the follow-up appointment.

## 2013-03-07 NOTE — Progress Notes (Signed)
Patient ID: [name], male   DOB: 1956-07-31, 57 y.o.   MRN: [id number] 57 yo with history of CAD s/p bare metal stenting to RCA and LAD and angioplasty to CFX in 2002 presents for cardiology followup.  He has done well since his last appointment in this office.  No chest pain or nitroglycerin use.  He denies exertional dyspnea.  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  He is using his CPAP some but not every night.  He was told by his plant nurse that he has PVCs so he restarted his Toprol XL.  He does not feel palpitations.  Weight is down 10 lbs since last appointment.   ECG: NSR with PVC  Labs (3/11): K 4.5, creatinine 1.1, LDL 50, HDL 33.5, TSH normal Labs (10/13): LDL 71, K 4.9, creatinine 1.1  PMH: 1. GERD 2. Hyperlipidemia 3. CAD: 2002 patient had BMS to LAD and RCA and PTCA to CFX.  Myoview in 2007 showed no ischemia.  4. OSA: using CPAp.  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  Married, lives in [place].  Quit smoking 2002.   FH: CAD  Current Outpatient Prescriptions  Medication Sig Dispense Refill  . aspirin EC 81 MG tablet Take 1 tablet (81 mg total) by mouth daily.      Marland Kitchen atorvastatin (LIPITOR) 20 MG tablet Take 20 mg by mouth at bedtime.      . Coenzyme Q10 (CO Q 10 PO) Take 200 mg by mouth daily.      . metoprolol succinate (TOPROL-XL) 25 MG 24 hr tablet Take 0.5 tablets (12.5 mg total) by mouth daily.  45 tablet  3  . Multiple Vitamin (MULTIVITAMIN) tablet Take 1 tablet by mouth daily.      Marland Kitchen NEXIUM 40 MG capsule TAKE 1 CAPSULE BY MOUTH DAILY BEFORE BREAKFAST  90 capsule  0  . nitroGLYCERIN (NITROSTAT) 0.4 MG SL tablet PLACE 1 TABLET UNDER THE TONGUE AS NEEDED.  100 tablet  3   No current facility-administered medications for this visit.    BP 130/70  Pulse 58  Ht 5\' 9"  (1.753 m)  Wt 245 lb (111.131 kg)  BMI 36.16 kg/m2 General: NAD, obese Neck: Thick,  No JVD, no thyromegaly or thyroid nodule.  Lungs: Clear to auscultation bilaterally with normal respiratory effort. CV: Nondisplaced PMI.  Heart regular S1/S2, no S3/S4, no murmur.  No peripheral edema.  No carotid bruit.  Normal pedal pulses.  Abdomen: Soft, nontender, no hepatosplenomegaly, no distention.  Neurologic: Alert and oriented x 3.  Psych: Normal affect. Extremities: No clubbing or cyanosis.   Assessment/Plan: 1. CAD: Stable with no ischemic symptoms.  Continue ASA 81, statin, Toprol XL.  2. Hyperlipidemia: check lipids today.  3. PVCs: Asymptomatic.  Reasonable to continue Toprol XL.    Marca Ancona 03/07/2013 9:11 AM

## 2013-03-11 ENCOUNTER — Other Ambulatory Visit: Payer: Self-pay | Admitting: *Deleted

## 2013-03-11 MED ORDER — ATORVASTATIN CALCIUM 20 MG PO TABS: 20.0000 mg | ORAL_TABLET | Freq: Every day | ORAL | Status: DC

## 2013-03-24 ENCOUNTER — Other Ambulatory Visit: Payer: Self-pay

## 2013-03-31 ENCOUNTER — Other Ambulatory Visit: Payer: Self-pay | Admitting: Cardiology

## 2013-04-01 ENCOUNTER — Encounter: Payer: Self-pay | Admitting: Internal Medicine

## 2013-04-03 ENCOUNTER — Other Ambulatory Visit: Payer: Self-pay | Admitting: Cardiology

## 2013-12-09 ENCOUNTER — Encounter: Payer: Self-pay | Admitting: Internal Medicine

## 2014-02-21 ENCOUNTER — Other Ambulatory Visit: Payer: Self-pay | Admitting: Cardiology

## 2014-03-29 ENCOUNTER — Other Ambulatory Visit: Payer: Self-pay | Admitting: Cardiology

## 2014-06-22 ENCOUNTER — Encounter: Payer: Self-pay | Admitting: *Deleted

## 2014-06-28 ENCOUNTER — Other Ambulatory Visit: Payer: Self-pay | Admitting: Cardiology

## 2014-06-28 ENCOUNTER — Encounter: Payer: Self-pay | Admitting: *Deleted

## 2014-06-28 ENCOUNTER — Encounter: Payer: Self-pay | Admitting: Cardiology

## 2014-06-28 ENCOUNTER — Ambulatory Visit (INDEPENDENT_AMBULATORY_CARE_PROVIDER_SITE_OTHER): Payer: 59 | Admitting: Cardiology

## 2014-06-28 VITALS — BP 138/76 | HR 49 | Ht 69.0 in | Wt 248.0 lb

## 2014-06-28 DIAGNOSIS — I2510 Atherosclerotic heart disease of native coronary artery without angina pectoris: Secondary | ICD-10-CM

## 2014-06-28 DIAGNOSIS — E785 Hyperlipidemia, unspecified: Secondary | ICD-10-CM

## 2014-06-28 LAB — CBC WITH DIFFERENTIAL/PLATELET
BASOS ABS: 0 10*3/uL (ref 0.0–0.1)
Basophils Relative: 0.4 % (ref 0.0–3.0)
EOS ABS: 0.1 10*3/uL (ref 0.0–0.7)
Eosinophils Relative: 1 % (ref 0.0–5.0)
HCT: 44.3 % (ref 39.0–52.0)
HEMOGLOBIN: 14.9 g/dL (ref 13.0–17.0)
LYMPHS ABS: 1.9 10*3/uL (ref 0.7–4.0)
LYMPHS PCT: 21.2 % (ref 12.0–46.0)
MCHC: 33.6 g/dL (ref 30.0–36.0)
MCV: 87.9 fl (ref 78.0–100.0)
MONOS PCT: 8.7 % (ref 3.0–12.0)
Monocytes Absolute: 0.8 10*3/uL (ref 0.1–1.0)
NEUTROS ABS: 6.3 10*3/uL (ref 1.4–7.7)
Neutrophils Relative %: 68.7 % (ref 43.0–77.0)
PLATELETS: 230 10*3/uL (ref 150.0–400.0)
RBC: 5.03 Mil/uL (ref 4.22–5.81)
RDW: 13.4 % (ref 11.5–15.5)
WBC: 9.2 10*3/uL (ref 4.0–10.5)

## 2014-06-28 LAB — BASIC METABOLIC PANEL
BUN: 14 mg/dL (ref 6–23)
CALCIUM: 9.3 mg/dL (ref 8.4–10.5)
CO2: 29 meq/L (ref 19–32)
Chloride: 101 mEq/L (ref 96–112)
Creatinine, Ser: 1.2 mg/dL (ref 0.4–1.5)
GFR: 69.53 mL/min (ref >60.00)
GLUCOSE: 146 mg/dL — AB (ref 70–99)
Potassium: 4.5 mEq/L (ref 3.5–5.1)
Sodium: 136 mEq/L (ref 135–145)

## 2014-06-28 LAB — LIPID PANEL
CHOLESTEROL: 111 mg/dL (ref 0–200)
HDL: 25.6 mg/dL — AB (ref >39.00)
LDL CALC: 61 mg/dL (ref 0–99)
NonHDL: 85.4
TRIGLYCERIDES: 123 mg/dL (ref 0.0–149.0)
Total CHOL/HDL Ratio: 4
VLDL: 24.6 mg/dL (ref 0.0–40.0)

## 2014-06-28 MED ORDER — METOPROLOL SUCCINATE ER 25 MG PO TB24: ORAL_TABLET | ORAL | Status: DC

## 2014-06-28 MED ORDER — ATORVASTATIN CALCIUM 20 MG PO TABS: 20.0000 mg | ORAL_TABLET | Freq: Every day | ORAL | Status: DC

## 2014-06-28 MED ORDER — NITROGLYCERIN 0.4 MG SL SUBL: SUBLINGUAL_TABLET | SUBLINGUAL | Status: DC

## 2014-06-28 NOTE — Patient Instructions (Signed)
Your physician recommends that you have lab work today--BMET/CBCd/Lipid profile.  Your physician wants you to follow-up in: 1 year with Dr Aundra Dubin. (October 2016).  You will receive a reminder letter in the mail two months in advance. If you don't receive a letter, please call our office to schedule the follow-up appointment.

## 2014-06-29 ENCOUNTER — Other Ambulatory Visit: Payer: Self-pay | Admitting: Cardiology

## 2014-06-29 NOTE — Progress Notes (Signed)
Patient ID: [name], male   DOB: [birth year], 58 y.o.   MRN: [id number] 58 yo with history of CAD s/p bare metal stenting to RCA and LAD and angioplasty to CFX in 2002 presents for cardiology followup.  He has done well since his last appointment in this office.  No chest pain or nitroglycerin use.  He denies exertional dyspnea.  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  He is using his CPAP some but not every night.  He has a history of PVCs but does not feel palpitations.    ECG: NSR at 49, otherwise normal  Labs (3/11): K 4.5, creatinine 1.1, LDL 50, HDL 33.5, TSH normal Labs (10/13): LDL 71, K 4.9, creatinine 1.1 Labs (6/14): LDL 60, HDL 60, K 4.2, creatinine 1.0  PMH: 1. GERD 2. Hyperlipidemia 3. CAD: 2002 patient had BMS to LAD and RCA and PTCA to CFX.  Myoview in 2007 showed no ischemia.  4. OSA: using CPAp.  SH: Patient works at [employer] as Therapist, occupational.  Married, lives in [place].  Quit smoking 2002.   FH: CAD  Current Outpatient Prescriptions  Medication Sig Dispense Refill  . aspirin EC 81 MG tablet Take 1 tablet (81 mg total) by mouth daily.      Marland Kitchen atorvastatin (LIPITOR) 20 MG tablet Take 1 tablet (20 mg total) by mouth daily.  90 tablet  3  . Coenzyme Q10 (CO Q 10 PO) Take 200 mg by mouth daily.      Marland Kitchen LIPITOR 20 MG tablet TAKE 1 TABLET (20 MG TOTAL) BY MOUTH DAILY.  90 tablet  3  . metoprolol succinate (TOPROL-XL) 25 MG 24 hr tablet 1/2 tablet (12.5mg ) daily  45 tablet  3  . Multiple Vitamin (MULTIVITAMIN) tablet Take 1 tablet by mouth daily.      Marland Kitchen NEXIUM 40 MG capsule TAKE ONE CAPSULE BY MOUTH EVERY MORNING WITH BREAKFAST  30 capsule  0  . nitroGLYCERIN (NITROSTAT) 0.4 MG SL tablet PLACE 1 TABLET UNDER THE TONGUE AS NEEDED.  100 tablet  3   No current facility-administered medications for this visit.    BP 138/76  Pulse 49  Ht 5\' 9"  (1.753 m)  Wt 248 lb (112.492  kg)  BMI 36.61 kg/m2 General: NAD, obese Neck: Thick, No JVD, no thyromegaly or thyroid nodule.  Lungs: Clear to auscultation bilaterally with normal respiratory effort. CV: Nondisplaced PMI.  Heart regular S1/S2, no S3/S4, no murmur.  No peripheral edema.  No carotid bruit.  Normal pedal pulses.  Abdomen: Soft, nontender, no hepatosplenomegaly, no distention.  Neurologic: Alert and oriented x 3.  Psych: Normal affect. Extremities: No clubbing or cyanosis.   Assessment/Plan: 1. CAD: Stable with no ischemic symptoms.  Continue ASA 81, statin, Toprol XL.  2. Hyperlipidemia: check lipids today.  3. PVCs: Asymptomatic.  Reasonable to continue Toprol XL.    Loralie Champagne 06/29/2014

## 2014-07-04 ENCOUNTER — Telehealth: Payer: Self-pay | Admitting: Cardiology

## 2014-07-04 ENCOUNTER — Other Ambulatory Visit: Payer: Self-pay | Admitting: *Deleted

## 2014-07-04 MED ORDER — ESOMEPRAZOLE MAGNESIUM 40 MG PO CPDR: DELAYED_RELEASE_CAPSULE | ORAL | Status: DC

## 2014-07-04 NOTE — Telephone Encounter (Signed)
Follow up  ° ° ° °Returning call back to nurse  °

## 2014-07-04 NOTE — Telephone Encounter (Signed)
LMTCB

## 2014-07-04 NOTE — Telephone Encounter (Signed)
Spoke with patient about recent lab results 

## 2014-11-13 ENCOUNTER — Encounter: Payer: Self-pay | Admitting: Family Medicine

## 2014-11-18 ENCOUNTER — Encounter: Payer: Self-pay | Admitting: Family Medicine

## 2014-12-15 LAB — HM DIABETES EYE EXAM

## 2015-02-15 ENCOUNTER — Encounter: Payer: Self-pay | Admitting: Family Medicine

## 2015-06-17 ENCOUNTER — Other Ambulatory Visit: Payer: Self-pay | Admitting: Cardiology

## 2015-06-22 ENCOUNTER — Other Ambulatory Visit: Payer: Self-pay | Admitting: Cardiology

## 2015-07-09 ENCOUNTER — Ambulatory Visit (INDEPENDENT_AMBULATORY_CARE_PROVIDER_SITE_OTHER): Payer: Commercial Managed Care - HMO | Admitting: Physician Assistant

## 2015-07-09 ENCOUNTER — Encounter: Payer: Self-pay | Admitting: Physician Assistant

## 2015-07-09 DIAGNOSIS — I2510 Atherosclerotic heart disease of native coronary artery without angina pectoris: Secondary | ICD-10-CM | POA: Diagnosis not present

## 2015-07-09 DIAGNOSIS — E785 Hyperlipidemia, unspecified: Secondary | ICD-10-CM | POA: Diagnosis not present

## 2015-07-09 DIAGNOSIS — E669 Obesity, unspecified: Secondary | ICD-10-CM | POA: Insufficient documentation

## 2015-07-09 MED ORDER — METOPROLOL SUCCINATE ER 25 MG PO TB24: 12.5000 mg | ORAL_TABLET | Freq: Every day | ORAL | Status: DC

## 2015-07-09 MED ORDER — LIPITOR 20 MG PO TABS: ORAL_TABLET | ORAL | Status: DC

## 2015-07-09 MED ORDER — NITROGLYCERIN 0.4 MG SL SUBL: 0.4000 mg | SUBLINGUAL_TABLET | SUBLINGUAL | Status: DC | PRN

## 2015-07-09 MED ORDER — ATORVASTATIN CALCIUM 20 MG PO TABS: ORAL_TABLET | ORAL | Status: DC

## 2015-07-09 MED ORDER — ASPIRIN EC 81 MG PO TBEC: 81.0000 mg | DELAYED_RELEASE_TABLET | Freq: Every day | ORAL | Status: AC

## 2015-07-09 MED ORDER — ESOMEPRAZOLE MAGNESIUM 40 MG PO CPDR: DELAYED_RELEASE_CAPSULE | ORAL | Status: DC

## 2015-07-09 NOTE — Patient Instructions (Addendum)
Medication Instructions:  Your physician recommends that you continue on your current medications as directed. Please refer to the Current Medication list given to you today.   Labwork: We have put orders in for you to come back and get   LIPIDS                                                                                        LFT                                                                                        CBC W/DIFF                                                                                        CMET                                                                                        TSH PLEASE COME FASTING.. NOTHING TO EAT OR DRINK AFTER MIDNIGHT THE NIGHT BEFORE  Testing/Procedures: None ordered  Follow-Up: Your physician wants you to follow-up in: [place].  You will receive a reminder letter in the mail two months in advance. If you don't receive a letter, please call our office to schedule the follow-up appointment.         If you need a refill on your cardiac medications before your next appointment, please call your pharmacy.

## 2015-07-09 NOTE — Assessment & Plan Note (Signed)
Weight loss program recommended

## 2015-07-09 NOTE — Assessment & Plan Note (Signed)
Fasting lipid panel and LFTs. Continue Lipitor.

## 2015-07-09 NOTE — Assessment & Plan Note (Signed)
Stable without chest pain 

## 2015-07-09 NOTE — Progress Notes (Signed)
Cardiology Office Note   Date:  07/09/2015   ID:  [name], DOB 1956-07-13, MRN [id number]  PCP:  Ria Bush, MD  Cardiologist: Dr. Aundra Dubin Chief Complaint: Yearly follow-up    History of Present Illness: [name] is a 59 y.o. male who presents for yearly follow-up. He has history CAD status post bare-metal stenting to the RCA and LAD and angioplasty to the circumflex in 2002. Myoview in 2007 showed no ischemia. He has hyperlipidemia, OSA on CPap and GERD.  He denies any chest pain, palpitations, dyspnea, dyspnea on exertion, dizziness or presyncope. 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 He hasn't had blood work since last year.    Past Medical History  Diagnosis Date  . Esophageal reflux   . HLD (hyperlipidemia)   . Coronary atherosclerosis of unspecified type of vessel, native or graft 2002    s/p 3 vessel PCI  . OSA (obstructive sleep apnea)      CPAP ([facility])  . Umbilical hernia   . Diabetes type 2, controlled ([place])     diet control-no meds.  Marland Kitchen Ex-smoker     quit 2002    Past Surgical History  Procedure Laterality Date  . Coronary stent placement  04/2001    x2 (Dr Orene Desanctis)  . Finger fracture surgery Right 2004    middle finger  . Anal fissure repair  2008  . Sigmoidoscopy  1996  . Colonoscopy  06/2012    15 polyps - adenomatous, mild diverticulosis ([facility])  . Sinus surgery with instatrak  2001     Current Outpatient Prescriptions  Medication Sig Dispense Refill  . aspirin EC 81 MG tablet Take 1 tablet (81 mg total) by mouth daily.    . Coenzyme Q10 (CO Q 10 PO) Take 200 mg by mouth daily.    Marland Kitchen LIPITOR 20 MG tablet TAKE 1 TABLET (20 MG TOTAL) BY MOUTH DAILY. 90 tablet 3  . LIPITOR 20 MG tablet TAKE 1 TABLET (20 MG TOTAL) BY MOUTH DAILY. 90 tablet 0  . metoprolol succinate (TOPROL-XL) 25 MG 24 hr tablet TAKE 1/2 TABLET BY MOUTH DAILY 45 tablet 0  . Multiple Vitamin (MULTIVITAMIN) tablet Take 1 tablet  by mouth daily.    Marland Kitchen NEXIUM 40 MG capsule TAKE ONE CAPSULE BY MOUTH EVERY MORNING WITH BREAKFAST.Marland KitchenMarland KitchenINS WILL PAY 11/6 90 capsule 3  . nitroGLYCERIN (NITROSTAT) 0.4 MG SL tablet Place 0.4 mg under the tongue every 5 (five) minutes as needed for chest pain (chest pain MAX 3 doses).     No current facility-administered medications for this visit.    Allergies:   Review of patient's allergies indicates no known allergies.    Social History:  The patient  reports that he quit smoking about 14 years ago. His smoking use included Cigarettes. He has a 15 pack-year smoking history. He has never used smokeless tobacco. He reports that he does not drink alcohol or use illicit drugs.   Family History:  The patient's family history includes Hypertension in his mother; Lung cancer (age of onset: 57) in his father. There is no history of Diabetes, Coronary artery disease, Stroke, Colon cancer, Esophageal cancer, Rectal cancer, or Stomach cancer.    ROS:  Please see the history of present illness.   Otherwise, review of systems are positive for none.   All other systems are reviewed and negative.    PHYSICAL EXAM: VS:  BP 120/76 mmHg  Pulse 52  Ht 5\' 9"  (1.753 m)  Wt 247 lb 12.8 oz (112.401 kg)  BMI 36.58 kg/m2 , BMI Body mass index is 36.58 kg/(m^2). GEN: Obese, in no acute distress Neck: no JVD, HJR, carotid bruits, or masses Cardiac:  RRR; no murmurs,gallop, rubs, thrill or heave,  Respiratory:  clear to auscultation bilaterally, normal work of breathing GI: soft, nontender, nondistended, + BS MS: no deformity or atrophy Extremities: without cyanosis, clubbing, edema, good distal pulses bilaterally.  Skin: warm and dry, no rash Neuro:  Strength and sensation are intact    EKG:  EKG is ordered today. The ekg ordered today demonstrates sinus bradycardia 52 bpm, no acute change   Recent Labs: No results found for requested labs within last 365 days.    Lipid Panel    Component Value  Date/Time   CHOL 111 06/28/2014 1456   TRIG 123.0 06/28/2014 1456   HDL 25.60* 06/28/2014 1456   CHOLHDL 4 06/28/2014 1456   VLDL 24.6 06/28/2014 1456   LDLCALC 61 06/28/2014 1456   LDLDIRECT 70.8 06/30/2012 1051      Wt Readings from Last 3 Encounters:  07/09/15 247 lb 12.8 oz (112.401 kg)  06/28/14 248 lb (112.492 kg)  03/07/13 245 lb (111.131 kg)      Other studies Reviewed: Additional studies/ records that were reviewed today include and review of the records demonstrates:   ANGIOGRAPHY:  There is no calcium. 1. Left main:  The left main coronary is normal. 2. Left anterior descending:  The left anterior descending is totally    occluded just after the diagonal and first septal. 3. Circumflex:  The circumflex reveals a large obtuse marginal and then    small AV circumflex with a 90% lesion. 4. Right coronary artery:  The right coronary artery is dominant.  There is    a 90% focal lesion in the mid RCA. 5. Left ventricle:  The left ventricle reveals akinesis in the mid distal    anterior wall.  Overall EF is fairly well preserved.   SUMMARY:  Patient with probable recent anteroseptal MI with total proximal LAD, high-grade lesion in the AV circumflex and high-grade lesion in the RCA with akinesis of the mid distal anterior wall.   The LIMA is patent.   Cine reviewed with Dr. Lia Foyer and will also be reviewed with Dr. Olevia Perches, and decision made concerning therapy, either CABG or percutaneous intervention. Attending:  Signa Kell, M.D. [facility] DD:  04/30/01 TD:  04/30/01 Job: [id number]   ASSESSMENT AND PLAN: Coronary atherosclerosis Stable without chest pain  Hyperlipidemia Fasting lipid panel and LFTs. Continue Lipitor.  Obesity (BMI 30-39.9) Weight loss program recommended     Signed, Ermalinda Barrios, PA-C  07/09/2015 12:52 PM    [facility] [address], [place], [place]  [address] Phone: [phone]; Fax: [phone]

## 2015-07-13 ENCOUNTER — Other Ambulatory Visit (INDEPENDENT_AMBULATORY_CARE_PROVIDER_SITE_OTHER): Payer: Commercial Managed Care - HMO

## 2015-07-13 DIAGNOSIS — I2510 Atherosclerotic heart disease of native coronary artery without angina pectoris: Secondary | ICD-10-CM

## 2015-07-13 DIAGNOSIS — E785 Hyperlipidemia, unspecified: Secondary | ICD-10-CM

## 2015-07-13 LAB — COMPREHENSIVE METABOLIC PANEL
ALT: 36 U/L (ref 9–46)
AST: 29 U/L (ref 10–35)
Albumin: 4 g/dL (ref 3.6–5.1)
Alkaline Phosphatase: 73 U/L (ref 40–115)
BUN: 14 mg/dL (ref 7–25)
CALCIUM: 9 mg/dL (ref 8.6–10.3)
CHLORIDE: 101 mmol/L (ref 98–110)
CO2: 26 mmol/L (ref 20–31)
Creat: 0.91 mg/dL (ref 0.70–1.33)
GLUCOSE: 180 mg/dL — AB (ref 65–99)
Potassium: 4.4 mmol/L (ref 3.5–5.3)
Sodium: 135 mmol/L (ref 135–146)
Total Bilirubin: 0.6 mg/dL (ref 0.2–1.2)
Total Protein: 7.5 g/dL (ref 6.1–8.1)

## 2015-07-13 LAB — CBC WITH DIFFERENTIAL/PLATELET
Basophils Absolute: 0 10*3/uL (ref 0.0–0.1)
Basophils Relative: 0 % (ref 0–1)
EOS PCT: 1 % (ref 0–5)
Eosinophils Absolute: 0.1 10*3/uL (ref 0.0–0.7)
HCT: 43.1 % (ref 39.0–52.0)
HEMOGLOBIN: 15.1 g/dL (ref 13.0–17.0)
Lymphocytes Relative: 26 % (ref 12–46)
Lymphs Abs: 2.1 10*3/uL (ref 0.7–4.0)
MCH: 30.5 pg (ref 26.0–34.0)
MCHC: 35 g/dL (ref 30.0–36.0)
MCV: 87.1 fL (ref 78.0–100.0)
MONO ABS: 0.7 10*3/uL (ref 0.1–1.0)
MPV: 10.7 fL (ref 8.6–12.4)
Monocytes Relative: 9 % (ref 3–12)
NEUTROS ABS: 5.1 10*3/uL (ref 1.7–7.7)
Neutrophils Relative %: 64 % (ref 43–77)
PLATELETS: 212 10*3/uL (ref 150–400)
RBC: 4.95 MIL/uL (ref 4.22–5.81)
RDW: 13.4 % (ref 11.5–15.5)
WBC: 8 10*3/uL (ref 4.0–10.5)

## 2015-07-13 LAB — LIPID PANEL
CHOL/HDL RATIO: 3.8 ratio (ref <=5.0)
Cholesterol: 114 mg/dL — ABNORMAL LOW (ref 125–200)
HDL: 30 mg/dL — AB (ref >=40)
LDL Cholesterol: 64 mg/dL (ref <130)
Triglycerides: 102 mg/dL (ref <150)
VLDL: 20 mg/dL (ref <30)

## 2015-07-13 LAB — HEPATIC FUNCTION PANEL
ALBUMIN: 4 g/dL (ref 3.6–5.1)
ALT: 36 U/L (ref 9–46)
AST: 29 U/L (ref 10–35)
Alkaline Phosphatase: 73 U/L (ref 40–115)
BILIRUBIN TOTAL: 0.6 mg/dL (ref 0.2–1.2)
Bilirubin, Direct: 0.2 mg/dL (ref <=0.2)
Indirect Bilirubin: 0.4 mg/dL (ref 0.2–1.2)
Total Protein: 7.5 g/dL (ref 6.1–8.1)

## 2015-07-13 LAB — TSH: TSH: 1.827 u[IU]/mL (ref 0.350–4.500)

## 2015-08-01 ENCOUNTER — Encounter: Payer: Self-pay | Admitting: Internal Medicine

## 2015-08-01 ENCOUNTER — Ambulatory Visit (INDEPENDENT_AMBULATORY_CARE_PROVIDER_SITE_OTHER): Payer: Commercial Managed Care - HMO | Admitting: Internal Medicine

## 2015-08-01 VITALS — BP 138/80 | HR 51 | Temp 97.4°F | Wt 249.0 lb

## 2015-08-01 DIAGNOSIS — M7711 Lateral epicondylitis, right elbow: Secondary | ICD-10-CM | POA: Diagnosis not present

## 2015-08-01 DIAGNOSIS — E119 Type 2 diabetes mellitus without complications: Secondary | ICD-10-CM

## 2015-08-01 LAB — HEMOGLOBIN A1C: Hgb A1c MFr Bld: 8.1 % — ABNORMAL HIGH (ref 4.6–6.5)

## 2015-08-01 NOTE — Progress Notes (Signed)
Pre visit review using our clinic review tool, if applicable. No additional management support is needed unless otherwise documented below in the visit note. 

## 2015-08-01 NOTE — Assessment & Plan Note (Signed)
Hasn't seen Dr Darnell Level in 3 years Will check A1c Chart to Dr Darnell Level to decide on follow up

## 2015-08-01 NOTE — Assessment & Plan Note (Signed)
Mild Discussed ice and tendon strap

## 2015-08-01 NOTE — Progress Notes (Signed)
Subjective:    Patient ID: [name], male    DOB: 24-Dec-1955, 59 y.o.   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 aspirin EC 81 MG tablet Take 1 tablet (81 mg total) by mouth daily. 90 tablet 3  . Coenzyme Q10 (CO Q 10 PO) Take 200 mg by mouth daily.    Marland Kitchen esomeprazole (NEXIUM) 40 MG capsule TAKE ONE CAPSULE BY MOUTH EVERY MORNING WITH BREAKFAST.Marland KitchenMarland KitchenINS WILL PAY 11/6 90 capsule 3  . LIPITOR 20 MG tablet TAKE 1 TABLET (20 MG TOTAL) BY MOUTH DAILY. 90 tablet 3  . metoprolol succinate (TOPROL-XL) 25 MG 24 hr tablet Take 0.5 tablets (12.5 mg total) by mouth daily. 45 tablet 3  . Multiple Vitamin (MULTIVITAMIN) tablet Take 1 tablet by mouth daily.    . nitroGLYCERIN (NITROSTAT) 0.4 MG SL tablet Place 1 tablet (0.4 mg total) under the tongue every 5 (five) minutes as needed for chest pain (chest pain MAX 3 doses). 25 tablet 3   No current facility-administered medications on file prior to visit.    No Known Allergies  Past Medical History  Diagnosis Date  . Esophageal reflux   . HLD (hyperlipidemia)   . Coronary atherosclerosis of unspecified type of vessel, native or graft 2002    s/p 3 vessel PCI  . OSA (obstructive sleep apnea)      CPAP ([place])  . Umbilical hernia   . Diabetes type 2, controlled ([place])     diet control-no meds.  Marland Kitchen Ex-smoker     quit 2002    Past Surgical History  Procedure Laterality Date  . Coronary stent placement  04/2001    x2 (Dr Orene Desanctis)  . Finger fracture surgery Right 2004    middle finger  . Anal fissure repair  2008  . Sigmoidoscopy  1996  .  Colonoscopy  06/2012    15 polyps - adenomatous, mild diverticulosis ([place])  . Sinus surgery with instatrak  2001    Family History  Problem Relation Age of Onset  . Lung cancer Father 57    smoker  . Hypertension Mother   . Diabetes Neg Hx   . Coronary artery disease Neg Hx   . Stroke Neg Hx   . Colon cancer Neg Hx   . Esophageal cancer Neg Hx   . Rectal cancer Neg Hx   . Stomach cancer Neg Hx     Social History   Social History  . Marital Status: Married    Spouse Name: N/A  . Number of Children: N/A  . Years of Education: N/A   Occupational History  . maintenance mechanic [employer]   Social History Main Topics  . Smoking status: Former Smoker -- 1.00 packs/day for 15 years    Types: Cigarettes    Quit date: 09/15/2000  . Smokeless tobacco: Never Used  . Alcohol Use: No  . Drug Use: No  . Sexual Activity: Not on file   Other Topics Concern  . Not on file   Social History Narrative   Caffeine: 1 pepsi/day   Lives with wife,  1 dog, 2 cats.  Grown daughter.   Occupation: Dealer   Edu: HS   Activity: work 12 hour days, gardens and fishes   Diet: good water daily, fruits/vegetables daily   Review of Systems He checks sugars once a week or so--usually ~140 fasting Weight stable Not always good about healthy eating    Objective:   Physical Exam  Constitutional: He appears well-developed. No distress.  Musculoskeletal:  Slight tenderness over lateral epicondyle of right elbow Normal ROM No bony tenderness at elbow or wrist  Skin:  ?? Small cyst in mid right forearm--no skin findings (reassured)          Assessment & Plan:

## 2015-08-03 ENCOUNTER — Other Ambulatory Visit: Payer: Self-pay | Admitting: Family Medicine

## 2015-08-03 ENCOUNTER — Other Ambulatory Visit: Payer: Self-pay | Admitting: *Deleted

## 2015-08-03 MED ORDER — METFORMIN HCL 500 MG PO TABS: 500.0000 mg | ORAL_TABLET | Freq: Every day | ORAL | Status: DC

## 2015-08-30 ENCOUNTER — Other Ambulatory Visit: Payer: Self-pay | Admitting: Cardiology

## 2015-09-07 ENCOUNTER — Other Ambulatory Visit: Payer: Self-pay | Admitting: Cardiology

## 2015-10-04 ENCOUNTER — Other Ambulatory Visit: Payer: Self-pay | Admitting: Cardiology

## 2015-10-04 NOTE — Telephone Encounter (Signed)
metoprolol succinate (TOPROL-XL) 25 MG 24 hr tablet  Medication   Date: 07/09/2015  Department: [facility] [place] Office  Ordering/Authorizing: Imogene Burn, PA-C      Order Providers    Prescribing Provider Encounter Provider   Imogene Burn, PA-C Imogene Burn, PA-C    Supervision Information    Supervising Provider Type of Supervision   Burnell Blanks, MD Incident To    Medication Detail      Disp Refills Start End     metoprolol succinate (TOPROL-XL) 25 MG 24 hr tablet 45 tablet 3 07/09/2015     Sig - Route: Take 0.5 tablets (12.5 mg total) by mouth daily. - Oral    Notes to Pharmacy: Please call and schedule an appointment    E-Prescribing Status: Receipt confirmed by pharmacy (07/09/2015 1:10 PM EDT)     Pharmacy    [id number] - [place], [place] - [address]

## 2015-10-17 ENCOUNTER — Encounter: Payer: Self-pay | Admitting: Family Medicine

## 2015-10-17 ENCOUNTER — Ambulatory Visit (INDEPENDENT_AMBULATORY_CARE_PROVIDER_SITE_OTHER): Payer: Commercial Managed Care - HMO | Admitting: Family Medicine

## 2015-10-17 VITALS — BP 118/80 | HR 64 | Temp 97.5°F | Wt 240.5 lb

## 2015-10-17 DIAGNOSIS — Z1159 Encounter for screening for other viral diseases: Secondary | ICD-10-CM | POA: Diagnosis not present

## 2015-10-17 DIAGNOSIS — L570 Actinic keratosis: Secondary | ICD-10-CM

## 2015-10-17 DIAGNOSIS — E1165 Type 2 diabetes mellitus with hyperglycemia: Secondary | ICD-10-CM | POA: Diagnosis not present

## 2015-10-17 DIAGNOSIS — IMO0001 Reserved for inherently not codable concepts without codable children: Secondary | ICD-10-CM

## 2015-10-17 DIAGNOSIS — M7711 Lateral epicondylitis, right elbow: Secondary | ICD-10-CM | POA: Diagnosis not present

## 2015-10-17 MED ORDER — ONETOUCH ULTRASOFT LANCETS MISC
Status: DC
Start: 2015-10-17 — End: 2016-10-29

## 2015-10-17 MED ORDER — GLUCOSE BLOOD VI STRP: ORAL_STRIP | Status: DC

## 2015-10-17 MED ORDER — ONETOUCH ULTRA SYSTEM W/DEVICE KIT: 1.0000 | PACK | Freq: Once | Status: DC

## 2015-10-17 NOTE — Progress Notes (Signed)
Pre visit review using our clinic review tool, if applicable. No additional management support is needed unless otherwise documented below in the visit note. 

## 2015-10-17 NOTE — Assessment & Plan Note (Signed)
Persistent discomfort. Placed in tennis elbow strap.

## 2015-10-17 NOTE — Assessment & Plan Note (Signed)
Reviewed diagnosis with patient, discussed management of diabetes. Will refer to DSME per pt request Discussed foot hygiene. Foot exam today. Next eye exam will be due 12/2015. Will consider pnuemovax. Provided with handout on diabetic diet.

## 2015-10-17 NOTE — Assessment & Plan Note (Signed)
Of R forearm. rec moisturizer. If persistent next visit, discussed liquid nitrogen therapy.

## 2015-10-17 NOTE — Progress Notes (Signed)
BP 118/80 mmHg  Pulse 64  Temp(Src) 97.5 F (36.4 C) (Oral)  Wt 240 lb 8 oz (109.09 kg)   CC: DM f/u visit  Subjective:    Patient ID: [name], male    DOB: 08-10-1956, 59 y.o.   MRN: [id number]  HPI: [name] is a 60 y.o. male presenting on 10/17/2015 for Follow-up   Last seen by me 08/2012. Saw Dr Silvio Pate 07/2015 with tennis elbow. Ongoing trouble.  He does regularly see cardiology for h/o CAD and HLD.   Dental work yesterday - abscessed tooth removed. Currently taking amoxiicillin.   DM - regularly does check sugars twice daily (am fasting and at bedtime): averaging 130-160. Has [employer] brand glucose meter. Last night 125, yesterday morning 135. Has been unable to take metformin due to . Stopped soft drinks (prior 3-4 pepsi's per day). Denies low sugars or hypoglycemic symptoms. Denies paresthesias. Last diabetic eye exam 12/2014.  Pneumovax: DUE.  Prevnar: not due yet.  Lab Results  Component Value Date   HGBA1C 8.1* 08/01/2015   Diabetic Foot Exam - Simple   Simple Foot Form  Diabetic Foot exam was performed with the following findings:  Yes 10/17/2015  8:25 AM  Visual Inspection  No deformities, no ulcerations, no other skin breakdown bilaterally:  Yes  Sensation Testing  Intact to touch and monofilament testing bilaterally:  Yes  Pulse Check  Posterior Tibialis and Dorsalis pulse intact bilaterally:  Yes  Comments      Relevant past medical, surgical, family and social history reviewed and updated as indicated. Interim medical history since our last visit reviewed. Allergies and medications reviewed and updated. Current Outpatient Prescriptions on File Prior to Visit  Medication Sig  . aspirin EC 81 MG tablet Take 1 tablet (81 mg total) by mouth daily.  . Coenzyme Q10 (CO Q 10 PO) Take 200 mg by mouth daily.  Marland Kitchen esomeprazole (NEXIUM) 40 MG capsule TAKE ONE CAPSULE BY MOUTH EVERY MORNING WITH BREAKFAST.Marland KitchenMarland KitchenINS WILL PAY 11/6  . LIPITOR 20 MG tablet TAKE 1  TABLET (20 MG TOTAL) BY MOUTH DAILY.  . metoprolol succinate (TOPROL-XL) 25 MG 24 hr tablet Take 0.5 tablets (12.5 mg total) by mouth daily.  . Multiple Vitamin (MULTIVITAMIN) tablet Take 1 tablet by mouth daily.  . nitroGLYCERIN (NITROSTAT) 0.4 MG SL tablet Place 1 tablet (0.4 mg total) under the tongue every 5 (five) minutes as needed for chest pain (chest pain MAX 3 doses).   No current facility-administered medications on file prior to visit.    Review of Systems Per HPI unless specifically indicated in ROS section     Objective:    BP 118/80 mmHg  Pulse 64  Temp(Src) 97.5 F (36.4 C) (Oral)  Wt 240 lb 8 oz (109.09 kg)  Wt Readings from Last 3 Encounters:  10/17/15 240 lb 8 oz (109.09 kg)  08/01/15 249 lb (112.946 kg)  07/09/15 247 lb 12.8 oz (112.401 kg)    Physical Exam  Constitutional: He appears well-developed and well-nourished. No distress.  HENT:  Head: Normocephalic and atraumatic.  Right Ear: External ear normal.  Left Ear: External ear normal.  Nose: Nose normal.  Mouth/Throat: Oropharynx is clear and moist. No oropharyngeal exudate.  Eyes: Conjunctivae and EOM are normal. Pupils are equal, round, and reactive to light. No scleral icterus.  Neck: Normal range of motion. Neck supple.  Cardiovascular: Normal rate, regular rhythm, normal heart sounds and intact distal pulses.   No murmur heard. Pulmonary/Chest: Effort normal  and breath sounds normal. No respiratory distress. He has no wheezes. He has no rales.  Musculoskeletal: He exhibits no edema.  See HPI for foot exam if done Tender to palpation distal to R lateral epicondyle Mild discomfort to forced extension at wrist.  Lymphadenopathy:    He has no cervical adenopathy.  Skin: Skin is warm and dry. Rash noted. No erythema.  Dry scaly patches on right forearm  Psychiatric: He has a normal mood and affect.  Nursing note and vitals reviewed.  Results for orders placed or performed in visit on 10/17/15    HM DIABETES EYE EXAM  Result Value Ref Range   HM Diabetic Eye Exam No Retinopathy No Retinopathy      Assessment & Plan:  Over 25 minutes were spent face-to-face with the patient during this encounter and >50% of that time was spent on counseling and coordination of care  Problem List Items Addressed This Visit    Lateral epicondylitis of right elbow    Persistent discomfort. Placed in tennis elbow strap.       Diabetes mellitus type 2, uncontrolled ([place]) - Primary    Reviewed diagnosis with patient, discussed management of diabetes. Will refer to DSME per pt request Discussed foot hygiene. Foot exam today. Next eye exam will be due 12/2015. Will consider pnuemovax. Provided with handout on diabetic diet.      Relevant Orders   Ambulatory referral to diabetic education   Hemoglobin A1c   Microalbumin / creatinine urine ratio   AK (actinic keratosis)    Of R forearm. rec moisturizer. If persistent next visit, discussed liquid nitrogen therapy.       Other Visit Diagnoses    Need for hepatitis C screening test        Relevant Orders    Hepatitis C antibody, reflex        Follow up plan: Return in about 3 months (around 01/14/2016), or as needed, for annual exam, prior fasting for blood work.

## 2015-10-17 NOTE — Patient Instructions (Addendum)
Goal A1c <7% for controlled diabetes. Goal fasting sugars 80-120, goal 2 hours after a meal is <180. Less than 70 is too low.  We can stop metformin for now until recheck A1c.  Ask eye doctor to send me his report. Return in 3 weeks for lab visit only.  Return in 3-4 months for physical. Think about pneumonia shot.  We will set you up for diabetes education over next few months  Diabetes Mellitus and Food It is important for you to manage your blood sugar (glucose) level. Your blood glucose level can be greatly affected by what you eat. Eating healthier foods in the appropriate amounts throughout the day at about the same time each day will help you control your blood glucose level. It can also help slow or prevent worsening of your diabetes mellitus. Healthy eating may even help you improve the level of your blood pressure and reach or maintain a healthy weight.  General recommendations for healthful eating and cooking habits include:  Eating meals and snacks regularly. Avoid going long periods of time without eating to lose weight.  Eating a diet that consists mainly of plant-based foods, such as fruits, vegetables, nuts, legumes, and whole grains.  Using low-heat cooking methods, such as baking, instead of high-heat cooking methods, such as deep frying. Work with your dietitian to make sure you understand how to use the Nutrition Facts information on food labels. HOW CAN FOOD AFFECT ME? Carbohydrates Carbohydrates affect your blood glucose level more than any other type of food. Your dietitian will help you determine how many carbohydrates to eat at each meal and teach you how to count carbohydrates. Counting carbohydrates is important to keep your blood glucose at a healthy level, especially if you are using insulin or taking certain medicines for diabetes mellitus. Alcohol Alcohol can cause sudden decreases in blood glucose (hypoglycemia), especially if you use insulin or take certain  medicines for diabetes mellitus. Hypoglycemia can be a life-threatening condition. Symptoms of hypoglycemia (sleepiness, dizziness, and disorientation) are similar to symptoms of having too much alcohol.  If your health care provider has given you approval to drink alcohol, do so in moderation and use the following guidelines:  Women should not have more than one drink per day, and men should not have more than two drinks per day. One drink is equal to:  12 oz of beer.  5 oz of wine.  1 oz of hard liquor.  Do not drink on an empty stomach.  Keep yourself hydrated. Have water, diet soda, or unsweetened iced tea.  Regular soda, juice, and other mixers might contain a lot of carbohydrates and should be counted. WHAT FOODS ARE NOT RECOMMENDED? As you make food choices, it is important to remember that all foods are not the same. Some foods have fewer nutrients per serving than other foods, even though they might have the same number of calories or carbohydrates. It is difficult to get your body what it needs when you eat foods with fewer nutrients. Examples of foods that you should avoid that are high in calories and carbohydrates but low in nutrients include:  Trans fats (most processed foods list trans fats on the Nutrition Facts label).  Regular soda.  Juice.  Candy.  Sweets, such as cake, pie, doughnuts, and cookies.  Fried foods. WHAT FOODS CAN I EAT? Eat nutrient-rich foods, which will nourish your body and keep you healthy. The food you should eat also will depend on several factors, including:  The  calories you need.  The medicines you take.  Your weight.  Your blood glucose level.  Your blood pressure level.  Your cholesterol level. You should eat a variety of foods, including:  Protein.  Lean cuts of meat.  Proteins low in saturated fats, such as fish, egg whites, and beans. Avoid processed meats.  Fruits and vegetables.  Fruits and vegetables that may  help control blood glucose levels, such as apples, mangoes, and yams.  Dairy products.  Choose fat-free or low-fat dairy products, such as milk, yogurt, and cheese.  Grains, bread, pasta, and rice.  Choose whole grain products, such as multigrain bread, whole oats, and brown rice. These foods may help control blood pressure.  Fats.  Foods containing healthful fats, such as nuts, avocado, olive oil, canola oil, and fish. DOES EVERYONE WITH DIABETES MELLITUS HAVE THE SAME MEAL PLAN? Because every person with diabetes mellitus is different, there is not one meal plan that works for everyone. It is very important that you meet with a dietitian who will help you create a meal plan that is just right for you.   This information is not intended to replace advice given to you by your health care provider. Make sure you discuss any questions you have with your health care provider.   Document Released: 05/29/2005 Document Revised: 09/22/2014 Document Reviewed: 07/29/2013 Elsevier Interactive Patient Education [employer].

## 2015-11-06 ENCOUNTER — Encounter (HOSPITAL_COMMUNITY): Payer: Self-pay | Admitting: Emergency Medicine

## 2015-11-06 ENCOUNTER — Emergency Department (HOSPITAL_COMMUNITY)
Admission: EM | Admit: 2015-11-06 | Discharge: 2015-11-07 | Disposition: A | Discharge status: Home / Self Care | Payer: Commercial Managed Care - HMO | Attending: Emergency Medicine | Admitting: Emergency Medicine

## 2015-11-06 ENCOUNTER — Telehealth: Payer: Self-pay | Admitting: Family Medicine

## 2015-11-06 DIAGNOSIS — E785 Hyperlipidemia, unspecified: Secondary | ICD-10-CM | POA: Insufficient documentation

## 2015-11-06 DIAGNOSIS — Z7982 Long term (current) use of aspirin: Secondary | ICD-10-CM | POA: Insufficient documentation

## 2015-11-06 DIAGNOSIS — Z87891 Personal history of nicotine dependence: Secondary | ICD-10-CM | POA: Diagnosis not present

## 2015-11-06 DIAGNOSIS — Z79899 Other long term (current) drug therapy: Secondary | ICD-10-CM | POA: Diagnosis not present

## 2015-11-06 DIAGNOSIS — R1030 Lower abdominal pain, unspecified: Secondary | ICD-10-CM | POA: Insufficient documentation

## 2015-11-06 DIAGNOSIS — Z9861 Coronary angioplasty status: Secondary | ICD-10-CM | POA: Diagnosis not present

## 2015-11-06 DIAGNOSIS — R1033 Periumbilical pain: Secondary | ICD-10-CM | POA: Diagnosis not present

## 2015-11-06 DIAGNOSIS — R61 Generalized hyperhidrosis: Secondary | ICD-10-CM | POA: Diagnosis not present

## 2015-11-06 DIAGNOSIS — G4733 Obstructive sleep apnea (adult) (pediatric): Secondary | ICD-10-CM | POA: Insufficient documentation

## 2015-11-06 DIAGNOSIS — R300 Dysuria: Secondary | ICD-10-CM | POA: Diagnosis not present

## 2015-11-06 DIAGNOSIS — Z9981 Dependence on supplemental oxygen: Secondary | ICD-10-CM | POA: Diagnosis not present

## 2015-11-06 DIAGNOSIS — K219 Gastro-esophageal reflux disease without esophagitis: Secondary | ICD-10-CM | POA: Insufficient documentation

## 2015-11-06 DIAGNOSIS — E119 Type 2 diabetes mellitus without complications: Secondary | ICD-10-CM | POA: Diagnosis not present

## 2015-11-06 DIAGNOSIS — R112 Nausea with vomiting, unspecified: Secondary | ICD-10-CM | POA: Insufficient documentation

## 2015-11-06 DIAGNOSIS — R197 Diarrhea, unspecified: Secondary | ICD-10-CM | POA: Insufficient documentation

## 2015-11-06 DIAGNOSIS — I2510 Atherosclerotic heart disease of native coronary artery without angina pectoris: Secondary | ICD-10-CM | POA: Insufficient documentation

## 2015-11-06 DIAGNOSIS — R110 Nausea: Secondary | ICD-10-CM

## 2015-11-06 LAB — COMPREHENSIVE METABOLIC PANEL
ALBUMIN: 4 g/dL (ref 3.5–5.0)
ALT: 40 U/L (ref 17–63)
AST: 32 U/L (ref 15–41)
Alkaline Phosphatase: 80 U/L (ref 38–126)
Anion gap: 16 — ABNORMAL HIGH (ref 5–15)
BUN: 18 mg/dL (ref 6–20)
CHLORIDE: 104 mmol/L (ref 101–111)
CO2: 20 mmol/L — AB (ref 22–32)
CREATININE: 1.06 mg/dL (ref 0.61–1.24)
Calcium: 9.5 mg/dL (ref 8.9–10.3)
GFR calc non Af Amer: >60 mL/min (ref >60)
GLUCOSE: 199 mg/dL — AB (ref 65–99)
Potassium: 4 mmol/L (ref 3.5–5.1)
SODIUM: 140 mmol/L (ref 135–145)
Total Bilirubin: 1 mg/dL (ref 0.3–1.2)
Total Protein: 8.1 g/dL (ref 6.5–8.1)

## 2015-11-06 LAB — URINALYSIS, ROUTINE W REFLEX MICROSCOPIC
GLUCOSE, UA: NEGATIVE mg/dL
HGB URINE DIPSTICK: NEGATIVE
Ketones, ur: NEGATIVE mg/dL
Leukocytes, UA: NEGATIVE
Nitrite: NEGATIVE
Protein, ur: NEGATIVE mg/dL
SPECIFIC GRAVITY, URINE: 1.034 — AB (ref 1.005–1.030)
pH: 5.5 (ref 5.0–8.0)

## 2015-11-06 LAB — CBC
HCT: 48.1 % (ref 39.0–52.0)
HEMOGLOBIN: 16.4 g/dL (ref 13.0–17.0)
MCH: 30.4 pg (ref 26.0–34.0)
MCHC: 34.1 g/dL (ref 30.0–36.0)
MCV: 89.1 fL (ref 78.0–100.0)
PLATELETS: 196 10*3/uL (ref 150–400)
RBC: 5.4 MIL/uL (ref 4.22–5.81)
RDW: 13.2 % (ref 11.5–15.5)
WBC: 10.6 10*3/uL — ABNORMAL HIGH (ref 4.0–10.5)

## 2015-11-06 LAB — LIPASE, BLOOD: LIPASE: 20 U/L (ref 11–51)

## 2015-11-06 MED ORDER — ONDANSETRON 4 MG PO TBDP
ORAL_TABLET | ORAL | Status: AC
  Filled 2015-11-06: qty 1

## 2015-11-06 MED ORDER — SODIUM CHLORIDE 0.9 % IV SOLN
1000.0000 mL | Freq: Once | INTRAVENOUS | Status: AC
  Administered 2015-11-07: 1000 mL via INTRAVENOUS

## 2015-11-06 MED ORDER — ONDANSETRON HCL 4 MG/2ML IJ SOLN
4.0000 mg | Freq: Once | INTRAMUSCULAR | Status: AC
  Administered 2015-11-07: 4 mg via INTRAVENOUS
  Filled 2015-11-06: qty 2

## 2015-11-06 MED ORDER — SODIUM CHLORIDE 0.9 % IV SOLN
1000.0000 mL | INTRAVENOUS | Status: DC
  Administered 2015-11-07: 1000 mL via INTRAVENOUS

## 2015-11-06 MED ORDER — MORPHINE SULFATE (PF) 4 MG/ML IV SOLN
6.0000 mg | Freq: Once | INTRAVENOUS | Status: AC
  Administered 2015-11-07: 6 mg via INTRAVENOUS
  Filled 2015-11-06: qty 2

## 2015-11-06 MED ORDER — ONDANSETRON 4 MG PO TBDP
4.0000 mg | ORAL_TABLET | Freq: Once | ORAL | Status: AC | PRN
  Administered 2015-11-06: 4 mg via ORAL

## 2015-11-06 NOTE — ED Notes (Signed)
Pt sts N/V/D starting this am 

## 2015-11-06 NOTE — Telephone Encounter (Signed)
Patient Name: BREY PEERSON  DOB: 1956-08-04    Initial Comment Caller states husband is vomiting, last urinate this morning; may have diarrhea;    Nurse Assessment  Nurse: Leilani Merl, RN, Heather Date/Time (Eastern Time): 11/06/2015 2:05:53 PM  Confirm and document reason for call. If symptomatic, describe symptoms. You must click the next button to save text entered. ---Caller states that he started with vomiting about 6 am this morning and he is still vomiting and the last 2 times it has been green.  Has the patient traveled out of the country within the last 30 days? ---Not Applicable  Does the patient have any new or worsening symptoms? ---Yes  Will a triage be completed? ---Yes  Related visit to physician within the last 2 weeks? ---No  Does the PT have any chronic conditions? (i.e. diabetes, asthma, etc.) ---Yes  List chronic conditions. ---DM  Is this a behavioral health or substance abuse call? ---No     Guidelines    Guideline Title Affirmed Question Affirmed Notes  Vomiting [1] SEVERE vomiting (e.g., 6 or more times/day) AND [2] present > 8 hours    Final Disposition User   Go to ED Now (or PCP triage) [place], RN, [place] states that he may wait a little while and see if it gets better, before going in to the ED.  Called the office and the nurse is on the phone, receptionist took message and will have nurse look in EPIC at patients info.   Referrals  GO TO FACILITY REFUSED   Disagree/Comply: Disagree  Disagree/Comply Reason: Disagree with instructions

## 2015-11-06 NOTE — Telephone Encounter (Signed)
I spoke with pt;since 6:30 AM today pt has hourly vomiting and diarrhea episode with sweating. Pt has not vomited in the last hr and pt feels slightly better. If pt vomits again he will go to UC for eval. No CP, dizziness, or fever. Urinated small amt about 10 mins ago. Pt cannot remember the last time voided normally.  Pt does not want to go to ED but will go to UC if does not continue to improve.

## 2015-11-06 NOTE — Telephone Encounter (Signed)
plz call tomorrow for f/u - recommend evaluation if persistent vomiting or any fevers. Push small sips fluids

## 2015-11-06 NOTE — ED Provider Notes (Signed)
CSN: [id number]     Arrival date & time 11/06/15  1647 History  By signing my name below, I, Nicole Kindred, attest that this documentation has been prepared under the direction and in the presence of Jola Schmidt, MD.   Electronically Signed: Nicole Kindred, ED Scribe. 11/06/2015. 1:22 AM     Chief Complaint  Patient presents with  . Emesis  . Diarrhea     The history is provided by the patient. No language interpreter was used.   HPI Comments: [name] is a 60 y.o. male with PMHx of DM and umbilical hernia who presents to the Emergency Department complaining of gradual onset, constant diarrhea, onset earlier today at 6 AM. Pt also complains of associated emesis, diaphoresis, dysuria, and lower, central abdominal pain. Pt states that the sweating begins after his episodes of emesis. No other worsening or alleviating factors noted. Pt denies fever and any other pertinent symptoms.   Past Medical History  Diagnosis Date  . Esophageal reflux   . HLD (hyperlipidemia)   . Coronary atherosclerosis of unspecified type of vessel, native or graft 2002    s/p 3 vessel PCI  . OSA (obstructive sleep apnea)      CPAP ([place])  . Umbilical hernia   . Diabetes type 2, controlled ([place])     diet control-no meds.  Marland Kitchen Ex-smoker     quit 2002   Past Surgical History  Procedure Laterality Date  . Coronary stent placement  04/2001    x2 (Dr Orene Desanctis)  . Finger fracture surgery Right 2004    middle finger  . Anal fissure repair  2008  . Sigmoidoscopy  1996  . Colonoscopy  06/2012    15 polyps - adenomatous, mild diverticulosis ([place])  . Sinus surgery with instatrak  2001   Family History  Problem Relation Age of Onset  . Lung cancer Father 52    smoker  . Hypertension Mother   . Diabetes Neg Hx   . Coronary artery disease Neg Hx   . Stroke Neg Hx   . Colon cancer Neg Hx   . Esophageal cancer Neg Hx   . Rectal cancer Neg Hx   . Stomach cancer Neg Hx    Social History   Substance Use Topics  . Smoking status: Former Smoker -- 1.00 packs/day for 15 years    Types: Cigarettes    Quit date: 09/15/2000  . Smokeless tobacco: Never Used  . Alcohol Use: No    Review of Systems  A complete 10 system review of systems was obtained and all systems are negative except as noted in the HPI and PMH.    Allergies  Review of patient's allergies indicates no known allergies.  Home Medications   Prior to Admission medications   Medication Sig Start Date End Date Taking? Authorizing Provider  aspirin EC 81 MG tablet Take 1 tablet (81 mg total) by mouth daily. 07/09/15  Yes Imogene Burn, PA-C  esomeprazole (NEXIUM) 40 MG capsule TAKE ONE CAPSULE BY MOUTH EVERY MORNING WITH BREAKFAST.Marland KitchenMarland KitchenINS WILL PAY 11/6 07/09/15  Yes Michele M Lenze, PA-C  LIPITOR 20 MG tablet TAKE 1 TABLET (20 MG TOTAL) BY MOUTH DAILY. 07/09/15  Yes Imogene Burn, PA-C  metoprolol succinate (TOPROL-XL) 25 MG 24 hr tablet Take 0.5 tablets (12.5 mg total) by mouth daily. 07/09/15  Yes Imogene Burn, PA-C  nitroGLYCERIN (NITROSTAT) 0.4 MG SL tablet Place 1 tablet (0.4 mg total) under the tongue every 5 (five) minutes as  needed for chest pain (chest pain MAX 3 doses). 07/09/15  Yes Imogene Burn, PA-C  Blood Glucose Monitoring Suppl (ONE TOUCH ULTRA SYSTEM KIT) w/Device KIT 1 kit by Does not apply route once. 10/17/15   Ria Bush, MD  glucose blood test strip Check twice daily and as needed. E11.65 10/17/15   Ria Bush, MD  Lancets [facility] ULTRASOFT) lancets Check twice daily and as needed. E11.65 10/17/15   Ria Bush, MD   BP 139/85 mmHg  Pulse 100  Temp(Src) 98.1 F (36.7 C) (Oral)  Resp 18  SpO2 98% Physical Exam  Constitutional: He is oriented to person, place, and time. He appears well-developed and well-nourished.  HENT:  Head: Normocephalic and atraumatic.  Eyes: EOM are normal.  Neck: Normal range of motion.  Cardiovascular: Normal rate, regular rhythm, normal  heart sounds and intact distal pulses.   Pulmonary/Chest: Effort normal and breath sounds normal. No respiratory distress.  Abdominal: Soft. He exhibits no distension. There is tenderness.  Mild paraumbilical TTP.    Musculoskeletal: Normal range of motion.  Neurological: He is alert and oriented to person, place, and time.  Skin: Skin is warm and dry.  Psychiatric: He has a normal mood and affect. Judgment normal.  Nursing note and vitals reviewed.   ED Course  Procedures (including critical care time) DIAGNOSTIC STUDIES: Oxygen Saturation is 98% on RA, normal by my interpretation.    COORDINATION OF CARE: 11:09 PM-Discussed treatment plan which includes CT abdomen pelvis  with contrast and .9% sodium chloride infusion with pt at bedside and pt agreed to plan.    Labs Review Labs Reviewed  COMPREHENSIVE METABOLIC PANEL - Abnormal; Notable for the following:    CO2 20 (*)    Glucose, Bld 199 (*)    Anion gap 16 (*)    All other components within normal limits  CBC - Abnormal; Notable for the following:    WBC 10.6 (*)    All other components within normal limits  URINALYSIS, ROUTINE W REFLEX MICROSCOPIC (NOT AT [facility]) - Abnormal; Notable for the following:    Color, Urine AMBER (*)    Specific Gravity, Urine 1.034 (*)    Bilirubin Urine SMALL (*)    All other components within normal limits  LIPASE, BLOOD    Imaging Review Ct Abdomen Pelvis W Contrast  11/07/2015  CLINICAL DATA:  Periumbilical pain with nausea, vomiting and diarrhea. EXAM: CT ABDOMEN AND PELVIS WITH CONTRAST TECHNIQUE: Multidetector CT imaging of the abdomen and pelvis was performed using the standard protocol following bolus administration of intravenous contrast. CONTRAST:  155m OMNIPAQUE IOHEXOL 300 MG/ML  SOLN COMPARISON:  None. FINDINGS: Lower chest: The included lung bases are clear. Heart normal in size, coronary artery calcifications are seen. Liver: Decreased density consistent with steatosis. No  focal lesion. Hepatobiliary: Layering calcified gallstones, no gallbladder inflammation. No biliary dilatation. Pancreas: No ductal dilatation or inflammation. Spleen: Prominent size measuring 13.1 cm craniocaudal. Adrenal glands: No nodule. Kidneys: Symmetric renal enhancement.  No hydronephrosis. Stomach/Bowel: Stomach physiologically distended. There are no dilated or thickened small bowel loops. Liquid and formed stool throughout the colon without colonic wall thickening. Moderate colonic diverticulosis in the distal descending and sigmoid colon without acute diverticulitis. The appendix is normal. Vascular/Lymphatic: No retroperitoneal adenopathy. Abdominal aorta is normal in caliber. Atherosclerosis without aneurysm. Reproductive: Prostate gland normal in size. Bladder: Physiologically distended, no wall thickening Other: No free air, free fluid, or intra-abdominal fluid collection. Fat within both inguinal canals. Musculoskeletal: There are no  acute or suspicious osseous abnormalities. Bone island in the right iliac bone. IMPRESSION: 1. No acute abnormality in the abdomen/pelvis. 2. Incidental findings include cholelithiasis and colonic diverticulosis. Atherosclerosis including the coronary arteries. Electronically Signed   By: Jeb Levering M.D.   On: 11/07/2015 01:14   I have personally reviewed and evaluated these images and lab results as part of my medical decision-making.   EKG Interpretation None      MDM   Final diagnoses:  Nausea  Diarrhea, unspecified type    1:59 AM Pt feeling better. Dc home. Ct abd/pelvis without acute abnormality. pcp follow up.   I personally performed the services described in this documentation, which was scribed in my presence. The recorded information has been reviewed and is accurate.         Jola Schmidt, MD 11/07/15 0200

## 2015-11-07 ENCOUNTER — Emergency Department (HOSPITAL_COMMUNITY): Payer: Commercial Managed Care - HMO

## 2015-11-07 ENCOUNTER — Encounter (HOSPITAL_COMMUNITY): Payer: Self-pay | Admitting: Radiology

## 2015-11-07 MED ORDER — IOHEXOL 300 MG/ML  SOLN
100.0000 mL | Freq: Once | INTRAMUSCULAR | Status: AC | PRN
  Administered 2015-11-07: 100 mL via INTRAVENOUS

## 2015-11-07 MED ORDER — ONDANSETRON 8 MG PO TBDP: 8.0000 mg | ORAL_TABLET | Freq: Three times a day (TID) | ORAL | Status: DC | PRN

## 2015-11-07 MED ORDER — OXYCODONE-ACETAMINOPHEN 5-325 MG PO TABS
1.0000 | ORAL_TABLET | Freq: Once | ORAL | Status: AC
Start: 2015-11-07 — End: 2015-11-07
  Administered 2015-11-07: 1 via ORAL
  Filled 2015-11-07: qty 1

## 2015-11-07 MED ORDER — METOCLOPRAMIDE HCL 5 MG/ML IJ SOLN
10.0000 mg | Freq: Once | INTRAMUSCULAR | Status: AC
Start: 2015-11-07 — End: 2015-11-07
  Administered 2015-11-07: 10 mg via INTRAVENOUS
  Filled 2015-11-07: qty 2

## 2015-11-07 NOTE — Telephone Encounter (Signed)
Pt stated that he did go to the ED yesterday and stated that he was feeling better.

## 2015-11-07 NOTE — Discharge Instructions (Signed)

## 2015-11-09 ENCOUNTER — Other Ambulatory Visit (INDEPENDENT_AMBULATORY_CARE_PROVIDER_SITE_OTHER): Payer: Commercial Managed Care - HMO

## 2015-11-09 ENCOUNTER — Other Ambulatory Visit: Payer: Self-pay | Admitting: Family Medicine

## 2015-11-09 DIAGNOSIS — E1165 Type 2 diabetes mellitus with hyperglycemia: Secondary | ICD-10-CM | POA: Diagnosis not present

## 2015-11-09 DIAGNOSIS — Z1159 Encounter for screening for other viral diseases: Secondary | ICD-10-CM

## 2015-11-09 DIAGNOSIS — IMO0001 Reserved for inherently not codable concepts without codable children: Secondary | ICD-10-CM

## 2015-11-09 LAB — MICROALBUMIN / CREATININE URINE RATIO
Creatinine,U: 382.2 mg/dL
MICROALB UR: 0.7 mg/dL (ref 0.0–1.9)
MICROALB/CREAT RATIO: 0.2 mg/g (ref 0.0–30.0)

## 2015-11-09 LAB — HEMOGLOBIN A1C: HEMOGLOBIN A1C: 7.3 % — AB (ref 4.6–6.5)

## 2015-11-10 LAB — HEPATITIS C ANTIBODY: HCV Ab: NEGATIVE

## 2015-11-20 ENCOUNTER — Encounter: Payer: Self-pay | Admitting: Family Medicine

## 2016-01-10 ENCOUNTER — Other Ambulatory Visit (INDEPENDENT_AMBULATORY_CARE_PROVIDER_SITE_OTHER): Payer: Commercial Managed Care - HMO

## 2016-01-10 ENCOUNTER — Other Ambulatory Visit: Payer: Self-pay | Admitting: Family Medicine

## 2016-01-10 DIAGNOSIS — E785 Hyperlipidemia, unspecified: Secondary | ICD-10-CM

## 2016-01-10 DIAGNOSIS — IMO0001 Reserved for inherently not codable concepts without codable children: Secondary | ICD-10-CM

## 2016-01-10 DIAGNOSIS — E1165 Type 2 diabetes mellitus with hyperglycemia: Secondary | ICD-10-CM

## 2016-01-10 LAB — LIPID PANEL
CHOL/HDL RATIO: 4
Cholesterol: 116 mg/dL (ref 0–200)
HDL: 31.8 mg/dL — ABNORMAL LOW (ref >39.00)
LDL Cholesterol: 62 mg/dL (ref 0–99)
NONHDL: 84.29
TRIGLYCERIDES: 113 mg/dL (ref 0.0–149.0)
VLDL: 22.6 mg/dL (ref 0.0–40.0)

## 2016-01-14 LAB — FRUCTOSAMINE: FRUCTOSAMINE: 270 umol/L (ref 190–270)

## 2016-01-17 ENCOUNTER — Ambulatory Visit (INDEPENDENT_AMBULATORY_CARE_PROVIDER_SITE_OTHER): Payer: Commercial Managed Care - HMO | Admitting: Family Medicine

## 2016-01-17 ENCOUNTER — Encounter: Payer: Self-pay | Admitting: Family Medicine

## 2016-01-17 VITALS — BP 118/78 | HR 50 | Temp 97.4°F | Ht 69.0 in | Wt 237.5 lb

## 2016-01-17 DIAGNOSIS — Z23 Encounter for immunization: Secondary | ICD-10-CM

## 2016-01-17 DIAGNOSIS — L570 Actinic keratosis: Secondary | ICD-10-CM | POA: Diagnosis not present

## 2016-01-17 DIAGNOSIS — N529 Male erectile dysfunction, unspecified: Secondary | ICD-10-CM | POA: Insufficient documentation

## 2016-01-17 DIAGNOSIS — I2510 Atherosclerotic heart disease of native coronary artery without angina pectoris: Secondary | ICD-10-CM

## 2016-01-17 DIAGNOSIS — E118 Type 2 diabetes mellitus with unspecified complications: Secondary | ICD-10-CM

## 2016-01-17 DIAGNOSIS — Z0000 Encounter for general adult medical examination without abnormal findings: Secondary | ICD-10-CM | POA: Diagnosis not present

## 2016-01-17 DIAGNOSIS — K635 Polyp of colon: Secondary | ICD-10-CM

## 2016-01-17 DIAGNOSIS — E785 Hyperlipidemia, unspecified: Secondary | ICD-10-CM

## 2016-01-17 DIAGNOSIS — G4733 Obstructive sleep apnea (adult) (pediatric): Secondary | ICD-10-CM

## 2016-01-17 MED ORDER — SILDENAFIL CITRATE 100 MG PO TABS: 50.0000 mg | ORAL_TABLET | Freq: Every day | ORAL | Status: DC | PRN

## 2016-01-17 NOTE — Assessment & Plan Note (Signed)
Preventative protocols reviewed and updated unless pt declined. Discussed healthy diet and lifestyle.  

## 2016-01-17 NOTE — Addendum Note (Signed)
Addended by: Ria Bush on: 01/17/2016 10:22 AM   Modules accepted: Miquel Dunn

## 2016-01-17 NOTE — Progress Notes (Addendum)
BP 118/78 mmHg  Pulse 50  Temp(Src) 97.4 F (36.3 C) (Oral)  Ht '5\' 9"'  (1.753 m)  Wt 237 lb 8 oz (107.729 kg)  BMI 35.06 kg/m2  SpO2 96%   CC: CPE  Subjective:    Patient ID: [name], male    DOB: 05-25-1956, 60 y.o.   MRN: [id number]  HPI: [name] is a 60 y.o. male presenting on 01/17/2016 for Annual Exam   Last visit we referred patient to DSME - has not yet gone. Advised to consider pneumovax. He just started metformin 530m at bedtime for last 10 days. Fasting sugars 95-150.   CT at ER 10/2015 - showing fatty liver, gallstones and diverticulosis. As well as plaque buildup in arteries. Known CAD s/p stent x2. Prior saw Dr BOlevia Perchesand MAundra Dubin   Preventative: COLONOSCOPY Date: 06/2012 15 polyps - adenomatous, mild diverticulosis ([place]) Prostate cancer - Urologist Dr. KSerita ButcherSeen years ago for bladder emptying problems. Told PSA was normal in past.Would like PSA checked next blood draw.Discussed screening - would like to continue. Flu shot - declines Tetanus 2012  Pneumovax - today Seat belt use discussed Sunscreen use discussed - AK on forearm. Requests LN2 therapy today  Caffeine: 1 pepsi/day Lives with wife, 1 dog, 2 cats. Grown daughter. Occupation: mDealerEdu: [employer] Activity: work 12 hour days, gardens and fishes Diet: good water daily, fruits/vegetables daily  Relevant past medical, surgical, family and social history reviewed and updated as indicated. Interim medical history since our last visit reviewed. Allergies and medications reviewed and updated. Current Outpatient Prescriptions on File Prior to Visit  Medication Sig  . aspirin EC 81 MG tablet Take 1 tablet (81 mg total) by mouth daily.  . Blood Glucose Monitoring Suppl (ONE TOUCH ULTRA SYSTEM KIT) w/Device KIT 1 kit by Does not apply route once.  .Marland Kitchenesomeprazole (NEXIUM) 40 MG capsule TAKE ONE CAPSULE BY MOUTH EVERY MORNING WITH BREAKFAST..Marland KitchenMarland KitchenNS WILL PAY 11/6  . glucose blood test strip  Check twice daily and as needed. E11.65  . Lancets (ONETOUCH ULTRASOFT) lancets Check twice daily and as needed. E11.65  . LIPITOR 20 MG tablet TAKE 1 TABLET (20 MG TOTAL) BY MOUTH DAILY.  . metoprolol succinate (TOPROL-XL) 25 MG 24 hr tablet Take 0.5 tablets (12.5 mg total) by mouth daily.  . nitroGLYCERIN (NITROSTAT) 0.4 MG SL tablet Place 1 tablet (0.4 mg total) under the tongue every 5 (five) minutes as needed for chest pain (chest pain MAX 3 doses).   No current facility-administered medications on file prior to visit.    Review of Systems  Constitutional: Negative for fever, chills, activity change, appetite change, fatigue and unexpected weight change.  HENT: Negative for hearing loss.   Eyes: Negative for visual disturbance.  Respiratory: Negative for cough, chest tightness, shortness of breath and wheezing.   Cardiovascular: Negative for chest pain, palpitations and leg swelling.  Gastrointestinal: Positive for nausea and abdominal pain. Negative for vomiting, diarrhea, constipation, blood in stool and abdominal distention.  Genitourinary: Negative for hematuria and difficulty urinating.  Musculoskeletal: Negative for myalgias, arthralgias and neck pain.  Skin: Negative for rash.  Neurological: Negative for dizziness, seizures, syncope and headaches.  Hematological: Negative for adenopathy. Does not bruise/bleed easily.  Psychiatric/Behavioral: Negative for dysphoric mood. The patient is not nervous/anxious.    Per HPI unless specifically indicated in ROS section     Objective:    BP 118/78 mmHg  Pulse 50  Temp(Src) 97.4 F (36.3 C) (Oral)  Ht '5\' 9"'  (  1.753 m)  Wt 237 lb 8 oz (107.729 kg)  BMI 35.06 kg/m2  SpO2 96%  Wt Readings from Last 3 Encounters:  01/17/16 237 lb 8 oz (107.729 kg)  10/17/15 240 lb 8 oz (109.09 kg)  08/01/15 249 lb (112.946 kg)    Physical Exam  Constitutional: He is oriented to person, place, and time. He appears well-developed and  well-nourished. No distress.  HENT:  Head: Normocephalic and atraumatic.  Right Ear: Hearing, tympanic membrane, external ear and ear canal normal.  Left Ear: Hearing, tympanic membrane, external ear and ear canal normal.  Nose: Nose normal.  Mouth/Throat: Uvula is midline, oropharynx is clear and moist and mucous membranes are normal. No oropharyngeal exudate, posterior oropharyngeal edema or posterior oropharyngeal erythema.  Eyes: Conjunctivae and EOM are normal. Pupils are equal, round, and reactive to light. No scleral icterus.  Neck: Normal range of motion. Neck supple. No thyromegaly present.  Cardiovascular: Normal rate, regular rhythm, normal heart sounds and intact distal pulses.   No murmur heard. Pulses:      Radial pulses are 2+ on the right side, and 2+ on the left side.  Pulmonary/Chest: Effort normal and breath sounds normal. No respiratory distress. He has no wheezes. He has no rales.  Abdominal: Soft. Bowel sounds are normal. He exhibits no distension and no mass. There is no tenderness. There is no rebound and no guarding.  Genitourinary: Rectum normal. Rectal exam shows no external hemorrhoid, no internal hemorrhoid, no fissure, no mass, no tenderness and anal tone normal. Prostate is enlarged (25gm). Prostate is not tender.  Musculoskeletal: Normal range of motion. He exhibits no edema.  Lymphadenopathy:    He has no cervical adenopathy.  Neurological: He is alert and oriented to person, place, and time.  CN grossly intact, station and gait intact  Skin: Skin is warm and dry. No rash noted.  Dry scaly lesion R forearm x2  Psychiatric: He has a normal mood and affect. His behavior is normal. Judgment and thought content normal.  Nursing note and vitals reviewed.  Results for orders placed or performed in visit on 01/10/16  Lipid panel  Result Value Ref Range   Cholesterol 116 0 - 200 mg/dL   Triglycerides 113.0 0.0 - 149.0 mg/dL   HDL 31.80 (L) >39.00 mg/dL   VLDL  22.6 0.0 - 40.0 mg/dL   LDL Cholesterol 62 0 - 99 mg/dL   Total CHOL/HDL Ratio 4    NonHDL 84.29   Fructosamine  Result Value Ref Range   Fructosamine 270 190 - 270 umol/L   Lab Results  Component Value Date   HGBA1C 7.3* 11/09/2015       Assessment & Plan:   Problem List Items Addressed This Visit    Hyperlipidemia    Chronic, stable. Continue current lipitor dosing      Relevant Medications   sildenafil (VIAGRA) 100 MG tablet   Obstructive sleep apnea    Has not been using CPAP.       Coronary atherosclerosis    Denies sxs. Continue aspirin, statin, beta blocker      Relevant Medications   sildenafil (VIAGRA) 100 MG tablet   Diabetes mellitus type 2, controlled, with complications ([place])    Improved readings after metformin started. Congratulated patient.  Pt has not schedule DSME. Pneumovax today.       Relevant Medications   metFORMIN (GLUCOPHAGE) 500 MG tablet   Healthcare maintenance - Primary    Preventative protocols reviewed and updated unless pt  declined. Discussed healthy diet and lifestyle.       Colon polyps    Overdue for rpt colonoscopy. Pt will call Dr Hilarie Fredrickson to schedule      AK (actinic keratosis)    S/p LN therapy today x2 to right forearm. Liquid nitrogen was applied for 10-12 seconds to the skin lesion and the expected blistering or scabbing reaction explained. Do not pick at the area.       Erectile dysfunction    Trial viagra. Discussed side effects and adverse effects to monitor for. Discussed NEED to avoid co-administration of SL nitro with viagra. Discussed if any chest pain to call 911 and not use SL nitro, advise EMS of viagra use so they don't administer nitro.           Follow up plan: Return in about 3 months (around 04/18/2016), or as needed, for follow up visit.  Ria Bush, MD

## 2016-01-17 NOTE — Assessment & Plan Note (Signed)
Has not been using CPAP.

## 2016-01-17 NOTE — Progress Notes (Signed)
Pre visit review using our clinic review tool, if applicable. No additional management support is needed unless otherwise documented below in the visit note. 

## 2016-01-17 NOTE — Assessment & Plan Note (Signed)
Trial viagra. Discussed side effects and adverse effects to monitor for. Discussed NEED to avoid co-administration of SL nitro with viagra. Discussed if any chest pain to call 911 and not use SL nitro, advise EMS of viagra use so they don't administer nitro.

## 2016-01-17 NOTE — Assessment & Plan Note (Addendum)
S/p LN therapy today x2 to right forearm. Liquid nitrogen was applied for 10-12 seconds to the skin lesion and the expected blistering or scabbing reaction explained. Do not pick at the area.

## 2016-01-17 NOTE — Addendum Note (Signed)
Addended by: Pilar Grammes on: 01/17/2016 10:40 AM   Modules accepted: Orders

## 2016-01-17 NOTE — Assessment & Plan Note (Signed)
Improved readings after metformin started. Congratulated patient.  Pt has not schedule DSME. Pneumovax today.

## 2016-01-17 NOTE — Assessment & Plan Note (Signed)
Overdue for rpt colonoscopy. Pt will call Dr Hilarie Fredrickson to schedule

## 2016-01-17 NOTE — Patient Instructions (Addendum)
Pneumovax today We have frozen spots on right forearm. Keep area clean and dry.  Call Dr Hilarie Fredrickson to reschedule colonoscopy Return in 3 months for diabetes follow up - good job with sugars to date.  Health Maintenance, Male A healthy lifestyle and preventative care can promote health and wellness.  Maintain regular health, dental, and eye exams.  Eat a healthy diet. Foods like vegetables, fruits, whole grains, low-fat dairy products, and lean protein foods contain the nutrients you need and are low in calories. Decrease your intake of foods high in solid fats, added sugars, and salt. Get information about a proper diet from your health care provider, if necessary.  Regular physical exercise is one of the most important things you can do for your health. Most adults should get at least 150 minutes of moderate-intensity exercise (any activity that increases your heart rate and causes you to sweat) each week. In addition, most adults need muscle-strengthening exercises on 2 or more days a week.   Maintain a healthy weight. The body mass index (BMI) is a screening tool to identify possible weight problems. It provides an estimate of body fat based on height and weight. Your health care provider can find your BMI and can help you achieve or maintain a healthy weight. For males 20 years and older:  A BMI below 18.5 is considered underweight.  A BMI of 18.5 to 24.9 is normal.  A BMI of 25 to 29.9 is considered overweight.  A BMI of 30 and above is considered obese.  Maintain normal blood lipids and cholesterol by exercising and minimizing your intake of saturated fat. Eat a balanced diet with plenty of fruits and vegetables. Blood tests for lipids and cholesterol should begin at age 56 and be repeated every 5 years. If your lipid or cholesterol levels are high, you are over age 96, or you are at high risk for heart disease, you may need your cholesterol levels checked more frequently.Ongoing high  lipid and cholesterol levels should be treated with medicines if diet and exercise are not working.  If you smoke, find out from your health care provider how to quit. If you do not use tobacco, do not start.  Lung cancer screening is recommended for adults aged 56-80 years who are at high risk for developing lung cancer because of a history of smoking. A yearly low-dose CT scan of the lungs is recommended for people who have at least a 30-pack-year history of smoking and are current smokers or have quit within the past 15 years. A pack year of smoking is smoking an average of 1 pack of cigarettes a day for 1 year (for example, a 30-pack-year history of smoking could mean smoking 1 pack a day for 30 years or 2 packs a day for 15 years). Yearly screening should continue until the smoker has stopped smoking for at least 15 years. Yearly screening should be stopped for people who develop a health problem that would prevent them from having lung cancer treatment.  If you choose to drink alcohol, do not have more than 2 drinks per day. One drink is considered to be 12 oz (360 mL) of beer, 5 oz (150 mL) of wine, or 1.5 oz (45 mL) of liquor.  Avoid the use of street drugs. Do not share needles with anyone. Ask for help if you need support or instructions about stopping the use of drugs.  High blood pressure causes heart disease and increases the risk of stroke. High blood  pressure is more likely to develop in:  People who have blood pressure in the end of the normal range (100-139/85-89 mm Hg).  People who are overweight or obese.  People who are African American.  If you are 81-75 years of age, have your blood pressure checked every 3-5 years. If you are 51 years of age or older, have your blood pressure checked every year. You should have your blood pressure measured twice--once when you are at a hospital or clinic, and once when you are not at a hospital or clinic. Record the average of the two  measurements. To check your blood pressure when you are not at a hospital or clinic, you can use:  An automated blood pressure machine at a pharmacy.  A home blood pressure monitor.  If you are 79-62 years old, ask your health care provider if you should take aspirin to prevent heart disease.  Diabetes screening involves taking a blood sample to check your fasting blood sugar level. This should be done once every 3 years after age 61 if you are at a normal weight and without risk factors for diabetes. Testing should be considered at a younger age or be carried out more frequently if you are overweight and have at least 1 risk factor for diabetes.  Colorectal cancer can be detected and often prevented. Most routine colorectal cancer screening begins at the age of 50 and continues through age 12. However, your health care provider may recommend screening at an earlier age if you have risk factors for colon cancer. On a yearly basis, your health care provider may provide home test kits to check for hidden blood in the stool. A small camera at the end of a tube may be used to directly examine the colon (sigmoidoscopy or colonoscopy) to detect the earliest forms of colorectal cancer. Talk to your health care provider about this at age 83 when routine screening begins. A direct exam of the colon should be repeated every 5-10 years through age 46, unless early forms of precancerous polyps or small growths are found.  People who are at an increased risk for hepatitis B should be screened for this virus. You are considered at high risk for hepatitis B if:  You were born in a country where hepatitis B occurs often. Talk with your health care provider about which countries are considered high risk.  Your parents were born in a high-risk country and you have not received a shot to protect against hepatitis B (hepatitis B vaccine).  You have HIV or AIDS.  You use needles to inject street drugs.  You live  with, or have sex with, someone who has hepatitis B.  You are a man who has sex with other men (MSM).  You get hemodialysis treatment.  You take certain medicines for conditions like cancer, organ transplantation, and autoimmune conditions.  Hepatitis C blood testing is recommended for all people born from [birth year] through 1964 and any individual with known risk factors for hepatitis C.  Healthy men should no longer receive prostate-specific antigen (PSA) blood tests as part of routine cancer screening. Talk to your health care provider about prostate cancer screening.  Testicular cancer screening is not recommended for adolescents or adult males who have no symptoms. Screening includes self-exam, a health care provider exam, and other screening tests. Consult with your health care provider about any symptoms you have or any concerns you have about testicular cancer.  Practice safe sex. Use condoms and avoid  high-risk sexual practices to reduce the spread of sexually transmitted infections (STIs).  You should be screened for STIs, including gonorrhea and chlamydia if:  You are sexually active and are younger than 24 years.  You are older than 24 years, and your health care provider tells you that you are at risk for this type of infection.  Your sexual activity has changed since you were last screened, and you are at an increased risk for chlamydia or gonorrhea. Ask your health care provider if you are at risk.  If you are at risk of being infected with HIV, it is recommended that you take a prescription medicine daily to prevent HIV infection. This is called pre-exposure prophylaxis (PrEP). You are considered at risk if:  You are a man who has sex with other men (MSM).  You are a heterosexual man who is sexually active with multiple partners.  You take drugs by injection.  You are sexually active with a partner who has HIV.  Talk with your health care provider about whether you are at  high risk of being infected with HIV. If you choose to begin PrEP, you should first be tested for HIV. You should then be tested every 3 months for as long as you are taking PrEP.  Use sunscreen. Apply sunscreen liberally and repeatedly throughout the day. You should seek shade when your shadow is shorter than you. Protect yourself by wearing long sleeves, pants, a wide-brimmed hat, and sunglasses year round whenever you are outdoors.  Tell your health care provider of new moles or changes in moles, especially if there is a change in shape or color. Also, tell your health care provider if a mole is larger than the size of a pencil eraser.  A one-time screening for abdominal aortic aneurysm (AAA) and surgical repair of large AAAs by ultrasound is recommended for men aged 76-75 years who are current or former smokers.  Stay current with your vaccines (immunizations).   This information is not intended to replace advice given to you by your health care provider. Make sure you discuss any questions you have with your health care provider.   Document Released: 02/28/2008 Document Revised: 09/22/2014 Document Reviewed: 01/27/2011 Elsevier Interactive Patient Education [employer].

## 2016-01-17 NOTE — Assessment & Plan Note (Signed)
Chronic, stable. Continue current lipitor dosing

## 2016-01-17 NOTE — Assessment & Plan Note (Signed)
Denies sxs. Continue aspirin, statin, beta blocker

## 2016-02-25 ENCOUNTER — Ambulatory Visit: Payer: Commercial Managed Care - HMO | Admitting: Family Medicine

## 2016-02-25 ENCOUNTER — Encounter: Payer: Self-pay | Admitting: Family Medicine

## 2016-02-25 ENCOUNTER — Ambulatory Visit (INDEPENDENT_AMBULATORY_CARE_PROVIDER_SITE_OTHER): Payer: Commercial Managed Care - HMO | Admitting: Family Medicine

## 2016-02-25 VITALS — BP 132/90 | HR 63 | Temp 97.7°F | Ht 69.0 in | Wt 237.1 lb

## 2016-02-25 DIAGNOSIS — W57XXXA Bitten or stung by nonvenomous insect and other nonvenomous arthropods, initial encounter: Secondary | ICD-10-CM

## 2016-02-25 DIAGNOSIS — T148 Other injury of unspecified body region: Secondary | ICD-10-CM | POA: Diagnosis not present

## 2016-02-25 MED ORDER — DOXYCYCLINE HYCLATE 100 MG PO TABS: 100.0000 mg | ORAL_TABLET | Freq: Two times a day (BID) | ORAL | Status: DC

## 2016-02-25 NOTE — Progress Notes (Signed)
Pre visit review using our clinic review tool, if applicable. No additional management support is needed unless otherwise documented below in the visit note. 

## 2016-02-25 NOTE — Assessment & Plan Note (Signed)
New problem. Given symptoms, treating with course of doxycycline.

## 2016-02-25 NOTE — Progress Notes (Signed)
Subjective:  Patient ID: [name], male    DOB: [birth year]  Age: 60 y.o. MRN: [id number]  CC: Tick bite  HPI:  60 year old male presents with complaints of tick bite.  Patient states that he has not been feeling well since this past Thursday. He's been using low-grade fever, chills, muscle aches/pain, and weakness. His wife he found a tick on him on Saturday. The tick was engorged and was successfully removed. [facility] was seen at [facility] and was given prophylactic doxycycline. He continues to feel poorly. He's had low-grade fever (Tmax 100's). He's also had chills, myalgias, and weakness. No known exacerbating factors. He did take the doxycycline. He's had no relief thus far. No other complaints at this time.  Social Hx   Social History   Social History  . Marital Status: Married    Spouse Name: N/A  . Number of Children: N/A  . Years of Education: N/A   Occupational History  . maintenance mechanic [employer]   Social History Main Topics  . Smoking status: Former Smoker -- 1.00 packs/day for 15 years    Types: Cigarettes    Quit date: 09/15/2000  . Smokeless tobacco: Never Used  . Alcohol Use: No  . Drug Use: No  . Sexual Activity: Not Asked   Other Topics Concern  . None   Social History Narrative   Caffeine: 1 pepsi/day   Lives with wife, 1 dog, 2 cats.  Grown daughter.   Occupation: Dealer   Edu: HS   Activity: work 12 hour days, gardens and fishes   Diet: good water daily, fruits/vegetables daily   Review of Systems  Constitutional: Positive for chills.       Low grade fever.   Musculoskeletal: Positive for myalgias.  Neurological: Positive for weakness.   Objective:  BP 132/90 mmHg  Pulse 63  Temp(Src) 97.7 F (36.5 C) (Oral)  Ht 5\' 9"  (1.753 m)  Wt 237 lb 2 oz (107.559 kg)  BMI 35.00 kg/m2  SpO2 95%  BP/Weight 02/25/2016 01/17/2016 0000000  Systolic BP Q000111Q [id number] [id number]  Diastolic BP 90 78 71  Wt. (Lbs) 237.13 237.5 -  BMI 35 35.06 -     Physical Exam  Constitutional: He is oriented to person, place, and time. He appears well-developed. No distress.  Cardiovascular: Normal rate and regular rhythm.   Pulmonary/Chest: Effort normal. He has no wheezes. He has no rales.  Neurological: He is alert and oriented to person, place, and time.  Skin:  Back - small papular lesion from tick bite. No surrounding erythema.   Psychiatric: He has a normal mood and affect.  Vitals reviewed.  Lab Results  Component Value Date   WBC 10.6* 11/06/2015   HGB 16.4 11/06/2015   HCT 48.1 11/06/2015   PLT 196 11/06/2015   GLUCOSE 199* 11/06/2015   CHOL 116 01/10/2016   TRIG 113.0 01/10/2016   HDL 31.80* 01/10/2016   LDLDIRECT 70.8 06/30/2012   LDLCALC 62 01/10/2016   ALT 40 11/06/2015   AST 32 11/06/2015   NA 140 11/06/2015   K 4.0 11/06/2015   CL 104 11/06/2015   CREATININE 1.06 11/06/2015   BUN 18 11/06/2015   CO2 20* 11/06/2015   TSH 1.827 07/13/2015   HGBA1C 7.3* 11/09/2015   MICROALBUR 0.7 11/09/2015    Assessment & Plan:   Problem List Items Addressed This Visit    Tick bite - Primary    New problem. Given symptoms, treating with course of doxycycline.  Meds ordered this encounter  Medications  . doxycycline (VIBRA-TABS) 100 MG tablet    Sig: Take 1 tablet (100 mg total) by mouth 2 (two) times daily.    Dispense:  28 tablet    Refill:  0    Follow-up: PRN  [place]

## 2016-02-25 NOTE — Patient Instructions (Signed)
Take the medication twice daily with food.  Follow up he filled improve or worsen.  Take care  Dr. Lacinda Axon

## 2016-06-14 ENCOUNTER — Other Ambulatory Visit: Payer: Self-pay | Admitting: Cardiology

## 2016-09-06 ENCOUNTER — Other Ambulatory Visit: Payer: Self-pay | Admitting: Family Medicine

## 2016-09-06 ENCOUNTER — Other Ambulatory Visit: Payer: Self-pay | Admitting: Physician Assistant

## 2016-09-09 ENCOUNTER — Encounter: Payer: Self-pay | Admitting: *Deleted

## 2016-09-13 ENCOUNTER — Other Ambulatory Visit: Payer: Self-pay | Admitting: Cardiology

## 2016-09-15 LAB — HM DIABETES EYE EXAM

## 2016-09-22 DIAGNOSIS — J00 Acute nasopharyngitis [common cold]: Secondary | ICD-10-CM | POA: Diagnosis not present

## 2016-09-22 DIAGNOSIS — R110 Nausea: Secondary | ICD-10-CM | POA: Diagnosis not present

## 2016-09-27 ENCOUNTER — Other Ambulatory Visit: Payer: Self-pay | Admitting: Cardiology

## 2016-09-27 ENCOUNTER — Other Ambulatory Visit: Payer: Self-pay | Admitting: Physician Assistant

## 2016-09-29 NOTE — Telephone Encounter (Signed)
Refill

## 2016-10-20 ENCOUNTER — Encounter: Payer: Self-pay | Admitting: Internal Medicine

## 2016-10-20 ENCOUNTER — Ambulatory Visit (INDEPENDENT_AMBULATORY_CARE_PROVIDER_SITE_OTHER): Payer: Commercial Managed Care - HMO | Admitting: Internal Medicine

## 2016-10-20 VITALS — BP 130/82 | HR 50 | Ht 69.0 in | Wt 241.2 lb

## 2016-10-20 DIAGNOSIS — I10 Essential (primary) hypertension: Secondary | ICD-10-CM

## 2016-10-20 DIAGNOSIS — I2510 Atherosclerotic heart disease of native coronary artery without angina pectoris: Secondary | ICD-10-CM

## 2016-10-20 DIAGNOSIS — E785 Hyperlipidemia, unspecified: Secondary | ICD-10-CM | POA: Diagnosis not present

## 2016-10-20 DIAGNOSIS — R252 Cramp and spasm: Secondary | ICD-10-CM | POA: Diagnosis not present

## 2016-10-20 DIAGNOSIS — M791 Myalgia: Secondary | ICD-10-CM | POA: Diagnosis not present

## 2016-10-20 HISTORY — DX: Essential (primary) hypertension: I10

## 2016-10-20 LAB — LIPID PANEL
Chol/HDL Ratio: 2.8 ratio units (ref 0.0–5.0)
Cholesterol, Total: 101 mg/dL (ref 100–199)
HDL: 36 mg/dL — AB (ref >39)
LDL Calculated: 48 mg/dL (ref 0–99)
TRIGLYCERIDES: 86 mg/dL (ref 0–149)
VLDL Cholesterol Cal: 17 mg/dL (ref 5–40)

## 2016-10-20 LAB — MAGNESIUM: Magnesium: 1.9 mg/dL (ref 1.6–2.3)

## 2016-10-20 LAB — CK: CK TOTAL: 141 U/L (ref 24–204)

## 2016-10-20 LAB — COMPREHENSIVE METABOLIC PANEL
ALT: 30 IU/L (ref 0–44)
AST: 26 IU/L (ref 0–40)
Albumin/Globulin Ratio: 1.5 (ref 1.2–2.2)
Albumin: 4.4 g/dL (ref 3.5–5.5)
Alkaline Phosphatase: 96 IU/L (ref 39–117)
BILIRUBIN TOTAL: 0.7 mg/dL (ref 0.0–1.2)
BUN / CREAT RATIO: 13 (ref 9–20)
BUN: 13 mg/dL (ref 6–24)
CHLORIDE: 97 mmol/L (ref 96–106)
CO2: 22 mmol/L (ref 18–29)
Calcium: 9.3 mg/dL (ref 8.7–10.2)
Creatinine, Ser: 0.98 mg/dL (ref 0.76–1.27)
GFR calc Af Amer: 97 mL/min/{1.73_m2} (ref >59)
GFR calc non Af Amer: 84 mL/min/{1.73_m2} (ref >59)
GLUCOSE: 182 mg/dL — AB (ref 65–99)
Globulin, Total: 2.9 g/dL (ref 1.5–4.5)
Potassium: 4.5 mmol/L (ref 3.5–5.2)
Sodium: 137 mmol/L (ref 134–144)
Total Protein: 7.3 g/dL (ref 6.0–8.5)

## 2016-10-20 NOTE — Patient Instructions (Addendum)
Medication Instructions:  Your physician recommends that you continue on your current medications as directed. Please refer to the Current Medication list given to you today.    Labwork: CMET/Lipid profile/ magnesium level/CK today  Testing/Procedures: None  Follow-Up: Your physician wants you to follow-up in: 1 year with Dr End. (February 2019).  You will receive a reminder letter in the mail two months in advance. If you don't receive a letter, please call our office to schedule the follow-up appointment.       If you need a refill on your cardiac medications before your next appointment, please call your pharmacy.

## 2016-10-20 NOTE — Progress Notes (Signed)
Follow-up Outpatient Visit Date: 10/20/2016  Primary Care Provider: Ria Bush, MD [place] [id number]  Chief Complaint: Follow-up coronary artery disease  HPI:  [name] is a 61 y.o. year-old male with history of coronary artery disease s/p BMS to the LAD and RCA, as well as balloon angioplasty to LCx, hyperlipidemia, GERD and OSA on CPAP, who presents for follow-up of CAD.  He was last seen in our office by Ermalinda Barrios on 07/09/15.  He has done well since that time with the exception of occasional muscle cramps involving all 4 extremities.  The cramps are not related to activity or time of day.  He is concerned that prolonged Nexium use may have led to electrolyte abnormalities, contributing to his cramps.  He reports significant GERD several years ago, which has resolved with Nexium.  The patient has not experienced any chest pain, shortness of breath, palpitations, lightheadedness, edema, or claudication.  He notes that his heart rate is always slow, typically 50-60 bpm.  Metoprolol was held many years ago by Dr. Olevia Perches, which resulted in occasional "skipped beats."  His BP when checked at the pharmacy is typically 120-130/70-80.  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  He is planning to retire in a few months after working for the same company for 31 years.  --------------------------------------------------------------------------------------------------  Cardiovascular History & Procedures: Cardiovascular Problems:  CAD s/p PCI/POBA  Risk Factors:  Known CAD, hypertension, hyperlipidemia, male gender, and age > 37  Cath/PCI:  PCI (05/05/01): PCI to mid RCA with NIR 3.0 x 13 mm BMS (post-dilated with 3.25 mm balloon). POBA to mid LCx with 2.25 mm cutting balloon.  PCI (05/03/01): PCI to proximal LAD CTO with Pixel 2.5 x 23 mm BMS (post-dilated with 3.0 mm balloon); POBA to mid LAD stenosis with 2.25  mm balloon  LHC (04/30/01): LMCA normal. LAD with CTO of proximal vessel.  LCx with large OM1 followed by 90% stenosis in small AV groove LCx.  Dominant RCA with 90% midvessel stenosis.  Preserved LVEF with mid/apical anterior akinesis.  CV Surgery:  None  EP Procedures and Devices:  None   Non-Invasive Evaluation(s):  Exercise myocardial perfusion stress test (12/23/05): Fair exercise capacity without evidence of ischemia or scar. LVEF 53%.  Recent CV Pertinent Labs: Lab Results  Component Value Date   CHOL 116 01/10/2016   HDL 31.80 (L) 01/10/2016   LDLCALC 62 01/10/2016   LDLDIRECT 70.8 06/30/2012   TRIG 113.0 01/10/2016   CHOLHDL 4 01/10/2016   K 4.0 11/06/2015   MG 1.9 11/18/2007   BUN 18 11/06/2015   CREATININE 1.06 11/06/2015   CREATININE 0.91 07/13/2015    Past medical and surgical history were reviewed and updated in EPIC.  Outpatient Encounter Prescriptions as of 10/20/2016  Medication Sig  . aspirin EC 81 MG tablet Take 1 tablet (81 mg total) by mouth daily.  . Blood Glucose Monitoring Suppl (ONE TOUCH ULTRA SYSTEM KIT) w/Device KIT 1 kit by Does not apply route once.  Marland Kitchen esomeprazole (NEXIUM) 40 MG capsule Take 1 capsule (40 mg total) by mouth daily with breakfast. *Patient needs to keep upcoming appointment for further refills*  . glucose blood test strip Check twice daily and as needed. E11.65  . Lancets (ONETOUCH ULTRASOFT) lancets Check twice daily and as needed. E11.65  . LIPITOR 20 MG tablet TAKE 1 TABLET (20 MG TOTAL) BY MOUTH DAILY.  . metFORMIN (GLUCOPHAGE) 500 MG  tablet TAKE 1 TABLET (500 MG TOTAL) BY MOUTH AT BEDTIME.  . metoprolol succinate (TOPROL-XL) 25 MG 24 hr tablet TAKE 1/2 TABLET BY MOUTH DAILY  . nitroGLYCERIN (NITROSTAT) 0.4 MG SL tablet Place 1 tablet (0.4 mg total) under the tongue every 5 (five) minutes as needed for chest pain (chest pain MAX 3 doses).  . sildenafil (VIAGRA) 100 MG tablet Take 0.5 tablets (50 mg total) by mouth daily as  needed for erectile dysfunction.  . [DISCONTINUED] doxycycline (VIBRA-TABS) 100 MG tablet Take 1 tablet (100 mg total) by mouth 2 (two) times daily.  . [DISCONTINUED] LIPITOR 20 MG tablet Take 1 tablet (20 mg total) by mouth daily.  . [DISCONTINUED] metFORMIN (GLUCOPHAGE) 500 MG tablet Take 1 tablet by mouth daily.   No facility-administered encounter medications on file as of 10/20/2016.     Allergies: Patient has no known allergies.  Social History   Social History  . Marital status: Married    Spouse name: [name]  . Number of children: [name]  . Years of education: [name]   Occupational History  . maintenance mechanic [employer]   Social History Main Topics  . Smoking status: Former Smoker    Packs/day: 1.00    Years: 15.00    Types: Cigarettes    Quit date: 09/15/2000  . Smokeless tobacco: Never Used  . Alcohol use No  . Drug use: No  . Sexual activity: Not on file   Other Topics Concern  . Not on file   Social History Narrative   Caffeine: 1 pepsi/day   Lives with wife, 1 dog, 2 cats.  Grown daughter.   Occupation: Dealer   Edu: HS   Activity: work 12 hour days, gardens and fishes   Diet: good water daily, fruits/vegetables daily    Family History  Problem Relation Age of Onset  . Lung cancer Father 76    smoker  . Hypertension Mother   . Diabetes Neg Hx   . Coronary artery disease Neg Hx   . Stroke Neg Hx   . Colon cancer Neg Hx   . Esophageal cancer Neg Hx   . Rectal cancer Neg Hx   . Stomach cancer Neg Hx     Review of Systems: A 12-system review of systems was performed and was negative except as noted in the HPI.  --------------------------------------------------------------------------------------------------  Physical Exam: BP 130/82 (BP Location: Left Arm, Patient Position: Sitting, Cuff Size: Large)   Pulse (!) 50   Ht 5' 9" (1.753 m)   Wt 241 lb 3.2 oz (109.4 kg)   SpO2 94%   BMI 35.62 kg/m   General:  Obese man, seated comfortably  in the exam room.  He is accompanied by his wife. HEENT: No conjunctival pallor or scleral icterus.  Moist mucous membranes.  OP clear. Neck: Supple without lymphadenopathy, thyromegaly, JVD, or HJR.  No carotid bruit. Lungs: Normal work of breathing.  Clear to auscultation bilaterally without wheezes or crackles. Heart: Regular rate and rhythm without murmurs, rubs, or gallops.  Non-displaced PMI. Abd: Bowel sounds present.  Soft, NT/ND without hepatosplenomegaly Ext: No lower extremity edema.  Radial, PT, and DP pulses are 2+ bilaterally. Skin: warm and dry without rash  EKG:  Sinus bradycardia; no other significant abnormalities or changes from prior tracing on 07/09/15 (I have personally reviewed both tracings).  Lab Results  Component Value Date   WBC 10.6 (H) 11/06/2015   HGB 16.4 11/06/2015   HCT 48.1 11/06/2015   MCV 89.1 11/06/2015  PLT 196 11/06/2015    Lab Results  Component Value Date   NA 140 11/06/2015   K 4.0 11/06/2015   CL 104 11/06/2015   CO2 20 (L) 11/06/2015   BUN 18 11/06/2015   CREATININE 1.06 11/06/2015   GLUCOSE 199 (H) 11/06/2015   ALT 40 11/06/2015    Lab Results  Component Value Date   CHOL 116 01/10/2016   HDL 31.80 (L) 01/10/2016   LDLCALC 62 01/10/2016   LDLDIRECT 70.8 06/30/2012   TRIG 113.0 01/10/2016   CHOLHDL 4 01/10/2016    --------------------------------------------------------------------------------------------------  ASSESSMENT AND PLAN: Coronary artery disease without angina Mr. Tomaso continues to do well without chest pain or shortness of breath.  We will continue his current regimen of aspirin and low-dose metoprolol.  His resting heart rate is again noted be low, though he has tolerated this well for many years and experienced palpitations when beta-blockade was held.  Hyperlipidemia Most recently LDL in 12/2015 was at goal (<70) on moderate-intensity statin therapy.  We will reckech this today and consider escalating  therapy if his LDL has increased.  Muscle cramps These are episodic and involve the upper and lower extremities at various times.  We will check CMP, magnesium, and creatine kinase today.  I encouraged Mr. Boakye to stay well-hydrated, as dehydration can exacerbate muscle cramps.  Hypertension Blood pressure borderline elevated today.  We will not make any medication changes today.  Addition of ACEI or ARB should be considered, given Mr. Pattison's history of diabetes.  I will defer this to his PCP.  Follow-up: Return to clinic in 1 year.  Nelva Bush, MD 10/20/2016 9:10 AM

## 2016-10-26 ENCOUNTER — Other Ambulatory Visit: Payer: Self-pay | Admitting: Internal Medicine

## 2016-10-27 ENCOUNTER — Other Ambulatory Visit: Payer: Self-pay | Admitting: *Deleted

## 2016-10-27 MED ORDER — LIPITOR 20 MG PO TABS: ORAL_TABLET | ORAL | 3 refills | Status: DC

## 2016-10-27 MED ORDER — NITROGLYCERIN 0.4 MG SL SUBL: 0.4000 mg | SUBLINGUAL_TABLET | SUBLINGUAL | 11 refills | Status: DC | PRN

## 2016-10-27 MED ORDER — METOPROLOL SUCCINATE ER 25 MG PO TB24: 12.5000 mg | ORAL_TABLET | Freq: Every day | ORAL | 3 refills | Status: DC

## 2016-10-27 MED ORDER — ESOMEPRAZOLE MAGNESIUM 40 MG PO CPDR: 40.0000 mg | DELAYED_RELEASE_CAPSULE | Freq: Every day | ORAL | 3 refills | Status: DC

## 2016-10-27 NOTE — Telephone Encounter (Signed)
Please advise if ok to refill. Thanks 

## 2016-10-28 NOTE — Telephone Encounter (Signed)
It appears these prescriptions were filled yesterday. No further action needed at this time.

## 2016-10-29 ENCOUNTER — Other Ambulatory Visit: Payer: Self-pay | Admitting: Family Medicine

## 2017-03-15 DIAGNOSIS — M25562 Pain in left knee: Secondary | ICD-10-CM | POA: Diagnosis not present

## 2017-08-03 DIAGNOSIS — M25562 Pain in left knee: Secondary | ICD-10-CM | POA: Diagnosis not present

## 2017-08-07 DIAGNOSIS — M25562 Pain in left knee: Secondary | ICD-10-CM | POA: Diagnosis not present

## 2017-08-10 ENCOUNTER — Telehealth: Payer: Self-pay | Admitting: Internal Medicine

## 2017-08-10 DIAGNOSIS — M25562 Pain in left knee: Secondary | ICD-10-CM | POA: Diagnosis not present

## 2017-08-10 NOTE — Telephone Encounter (Signed)
Patient call back, scheduler made cardia clearance appt for me and has an appt with Cecilie Kicks on 08/13/2017 at 11am.

## 2017-08-10 NOTE — Telephone Encounter (Signed)
    Chart reviewed as part of pre-operative protocol coverage. Because of [name]'s past medical history and time since last visit (last evaluated in 10/2016), he/she will require a follow-up visit in order to better assess preoperative cardiovascular risk. Their primary cardiologist is Dr. Gerald Stabs End.  Pre-op covering staff: - Please schedule appointment and call patient to inform them. - Please contact requesting surgeon's office via preferred method (i.e, phone, fax) to inform them of need for appointment prior to surgery.  [name], PA-C  08/10/2017, 3:46 PM

## 2017-08-10 NOTE — Telephone Encounter (Signed)
Left message for patient to contact office to setup appt for cardiac clearance.

## 2017-08-10 NOTE — Telephone Encounter (Signed)
° °  [name] Pre-operative Risk Assessment    Request for surgical clearance:  What type of surgery is being performed? Knee Scope   1. When is this surgery scheduled? Pending   2. Are there any medications that need to be held prior to surgery and how long?Aspirin 81 mg   3. Practice name and name of physician performing surgery?[name] , Dr. French Peck   4. What is your office phone and fax number? [phone] fax [phone]  Anesthesia type (None, local, MAC, general) ? General   [name] 08/10/2017, 3:36 PM  _________________________________________________________________   (provider comments below)

## 2017-08-11 ENCOUNTER — Encounter: Payer: Self-pay | Admitting: Internal Medicine

## 2017-08-11 ENCOUNTER — Telehealth: Payer: Self-pay

## 2017-08-11 ENCOUNTER — Ambulatory Visit: Payer: 59 | Admitting: Internal Medicine

## 2017-08-11 VITALS — BP 126/60 | HR 56 | Ht 70.0 in | Wt 230.4 lb

## 2017-08-11 DIAGNOSIS — G4733 Obstructive sleep apnea (adult) (pediatric): Secondary | ICD-10-CM

## 2017-08-11 DIAGNOSIS — R0609 Other forms of dyspnea: Secondary | ICD-10-CM | POA: Diagnosis not present

## 2017-08-11 DIAGNOSIS — R0600 Dyspnea, unspecified: Secondary | ICD-10-CM

## 2017-08-11 NOTE — Patient Instructions (Addendum)
Order- office spirometry dx  OSA,   Order- schedule unattended home sleep test   Dx OSA  Please call me about 2 weeks after your sleep test for results and recommendations. If appropriate, we may be able to start treatment before I see you next.  I will let your orthopedist know that they should watch for sleep apnea at the time of your surgery.  Flu vax standard

## 2017-08-11 NOTE — Progress Notes (Signed)
08/11/17-61 year old male former smoker for sleep evaluation. Medical problem list includes CAD/ 3v PCI, DM 2, GERD, OSA, HBP, hyperlipidemia NPSG 11/30/07 - AHI 70/ hr weight 260 lbs I had last seen in 2013 when body weight was 242 pounds.  Was working rotating shifts as a Therapist, occupational.  He was using CPAP auto/[employer].  History of repaired nasal fracture. -----Sleep Consult: pt due for knee surgery and needs clearance for OSA; Sleep Study well over 10 years ago. Not able to tolerate CPAP machine in past-problems with mask fittings,etc. He has retired and no longer deals with rotating shifts.  Has lost about 30 pounds since last visit.  Less dust exposure and less sinus trouble.  Wife tells him he snores less and he does not notice much daytime sleepiness anymore.  Admits occasional sneeze but no coughing.  Not much aware of dyspnea on exertion.  Office Spirometry 08/11/17-mild restriction of exhaled volume-FVC 3.40/71%, FEV1 2.79/77%, ratio 0.82, FEF 25-75% 3.52/118%  Prior to Admission medications   Medication Sig Start Date End Date Taking? Authorizing Provider  aspirin EC 81 MG tablet Take 1 tablet (81 mg total) by mouth daily. 07/09/15  Yes Imogene Burn, PA-C  Blood Glucose Monitoring Suppl (ONE TOUCH ULTRA SYSTEM KIT) w/Device KIT 1 kit by Does not apply route once. 10/17/15  Yes Ria Bush, MD  esomeprazole (NEXIUM) 40 MG capsule Take 1 capsule (40 mg total) by mouth daily at 12 noon. Pt prefers brand name 10/27/16  Yes End, Harrell Gave, MD  LIPITOR 20 MG tablet TAKE 1 TABLET (20 MG TOTAL) BY MOUTH DAILY. 10/27/16  Yes End, Harrell Gave, MD  metFORMIN (GLUCOPHAGE) 500 MG tablet TAKE 1 TABLET (500 MG TOTAL) BY MOUTH AT BEDTIME. 09/09/16  Yes Ria Bush, MD  metoprolol succinate (TOPROL-XL) 25 MG 24 hr tablet Take 0.5 tablets (12.5 mg total) by mouth daily. 10/27/16  Yes End, Harrell Gave, MD  nitroGLYCERIN (NITROSTAT) 0.4 MG SL tablet Place 1 tablet (0.4 mg total) under the  tongue every 5 (five) minutes as needed for chest pain (chest pain MAX 3 doses). 10/27/16  Yes End, Harrell Gave, MD  ONE TOUCH ULTRA TEST test strip CHECK TWICE DAILY AND AS NEEDED. E11.65 10/29/16  Yes Ria Bush, MD  [facility] LANCETS 33L MISC CHECK TWICE DAILY AND AS NEEDED. E11.65 10/29/16  Yes Ria Bush, MD  sildenafil (VIAGRA) 100 MG tablet Take 0.5 tablets (50 mg total) by mouth daily as needed for erectile dysfunction. 01/17/16  Yes Ria Bush, MD   Past Medical History:  Diagnosis Date  . Coronary atherosclerosis of unspecified type of vessel, native or graft 2002   s/p 3 vessel PCI  . Diabetes type 2, controlled ([place])    did not return calls to schedule DSME  . Esophageal reflux   . Essential hypertension 10/20/2016  . Ex-smoker    quit 2002  . HLD (hyperlipidemia)   . OSA (obstructive sleep apnea)     CPAP ([place])  . Umbilical hernia    Past Surgical History:  Procedure Laterality Date  . ANAL FISSURE REPAIR  2008  . COLONOSCOPY  06/2012   15 polyps - adenomatous, mild diverticulosis ([place])  . CORONARY STENT PLACEMENT  04/2001   x2 (Dr Orene Desanctis)  . FINGER FRACTURE SURGERY Right 2004   middle finger  . SIGMOIDOSCOPY  1996  . SINUS SURGERY WITH INSTATRAK  2001   Family History  Problem Relation Age of Onset  . Lung cancer Father 74       smoker  .  Hypertension Mother   . Diabetes Neg Hx   . Coronary artery disease Neg Hx   . Stroke Neg Hx   . Colon cancer Neg Hx   . Esophageal cancer Neg Hx   . Rectal cancer Neg Hx   . Stomach cancer Neg Hx    Social History   Socioeconomic History  . Marital status: Married    Spouse name: Not on file  . Number of children: Not on file  . Years of education: Not on file  . Highest education level: Not on file  Social Needs  . Financial resource strain: Not on file  . Food insecurity - worry: Not on file  . Food insecurity - inability: Not on file  . Transportation needs - medical: Not on file  .  Transportation needs - non-medical: Not on file  Occupational History  . Occupation: Civil engineer, contracting: [employer]  Tobacco Use  . Smoking status: Former Smoker    Packs/day: 1.00    Years: 15.00    Pack years: 15.00    Types: Cigarettes    Last attempt to quit: 09/15/2000    Years since quitting: 16.9  . Smokeless tobacco: Never Used  Substance and Sexual Activity  . Alcohol use: No  . Drug use: No  . Sexual activity: Not on file  Other Topics Concern  . Not on file  Social History Narrative   Caffeine: 1 pepsi/day   Lives with wife, 1 dog, 2 cats.  Grown daughter.   Occupation: Dealer   Edu: HS   Activity: work 12 hour days, gardens and fishes   Diet: good water daily, fruits/vegetables daily   ROS-see HPI   Negative unless "+" Constitutional:    weight loss, night sweats, fevers, chills, fatigue, lassitude. HEENT:    headaches, difficulty swallowing, tooth/dental problems, sore throat,       sneezing, itching, ear ache, + nasal congestion, post nasal drip, snoring CV:    chest pain, orthopnea, PND, swelling in lower extremities, anasarca,                                  dizziness, palpitations Resp:   shortness of breath with exertion or at rest.                productive cough,   non-productive cough, coughing up of blood.              change in color of mucus.  wheezing.   Skin:    rash or lesions. GI:  No-   heartburn, indigestion, abdominal pain, nausea, vomiting, diarrhea,                 change in bowel habits, loss of appetite GU: dysuria, change in color of urine, no urgency or frequency.   flank pain. MS:   + Joint pain, stiffness, decreased range of motion, back pain. Neuro-     nothing unusual Psych:  change in mood or affect.  depression or anxiety.   memory loss.  OBJ- Physical Exam General- Alert, Oriented, Affect-appropriate, Distress- none acute Skin- rash-none, lesions- none, excoriation- none Lymphadenopathy- none Head-  atraumatic            Eyes- Gross vision intact, PERRLA, conjunctivae and secretions clear            Ears- Hearing, canals-normal  Nose-+ turbinate edema +-Septal dev, mucus, polyps, erosion, perforation             Throat- Mallampati III , mucosa clear , drainage- none, tonsils- atrophic Neck- flexible , trachea midline, no stridor , thyroid nl, carotid no bruit Chest - symmetrical excursion , unlabored           Heart/CV- RRR , no murmur , no gallop  , no rub, nl s1 s2                           - JVD- none , edema- none, stasis changes- none, varices- none           Lung- clear to P&A, wheeze- none, cough- none , dullness-none, rub- none           Chest wall-  Abd-  Br/ Gen/ Rectal- Not done, not indicated Extrem- cyanosis- none, clubbing, none, atrophy- none, strength- nl Neuro- grossly intact to observation

## 2017-08-11 NOTE — Telephone Encounter (Signed)
   [facility] Pre-operative Risk Assessment    Request for surgical clearance:  1. What type of surgery is being performed? Left knee scope   2. When is this surgery scheduled? Pending   3. Are there any medications that need to be held prior to surgery and how long? 81 mg    4. Practice name and name of physician performing surgery?  [facility]  2. Dr. Earlie Server  5. What is your office phone and fax number?  1. Phone [phone]  2. Fax [phone] Attn: Kelly   6. Anesthesia type (None, local, MAC, general) ? None specified    Teressa Senter 08/11/2017, 11:44 AM  _________________________________________________________________   (provider comments below)

## 2017-08-11 NOTE — Assessment & Plan Note (Signed)
He minimizes this symptom now, limited some by arthritis in his knee so he is not as active as before.  No routine cough or wheeze.

## 2017-08-11 NOTE — Assessment & Plan Note (Signed)
He has achieved significant weight loss since his original sleep study.  We again discussed the basics of obstructive sleep apnea pertinent to his anticipated surgery and anesthesia.  I will advises orthopedic doctor to monitor his respiratory status closely in recovery.  Meanwhile we will update with new sleep study and, if appropriate, restart CPAP.  He plans to spend a month in [place] in February for the birth of a grandchild and we will work around that scheduling.

## 2017-08-12 NOTE — Telephone Encounter (Signed)
    Chart reviewed as part of pre-operative protocol coverage. Because of [name]'s past medical history and time since last visit, he/she will require a follow-up visit in order to better assess preoperative cardiovascular risk. Their primary cardiologist is Dr. Saunders Revel.  Pre-op covering staff: - Please schedule appointment and call patient to inform them. - Please contact requesting surgeon's office via preferred method (i.e, phone, fax) to inform them of need for appointment prior to surgery.  [place], [place]  08/12/2017, 5:00 PM

## 2017-08-12 NOTE — Progress Notes (Signed)
Cardiology Office Note   Date:  08/13/2017   ID:  [name], DOB [birth year], MRN [id number]  PCP:  Ria Bush, MD  Cardiologist:  Dr. Saunders Revel    Chief Complaint  Patient presents with  . Medical Clearance    left knee       History of Present Illness: [name] is a 61 y.o. male who presents for pre-op clearance for knee scope by Dr French Ana.    He has a history of coronary artery disease s/p BMS to the LAD and RCA, as well as balloon angioplasty to LCx, hyperlipidemia, GERD and OSA on CPAP, who presents for follow-up of CAD.  He was last seen in our office byDr. End 10/20/16.  Metoprolol was held many years ago by Dr. Olevia Perches, which resulted in occasional "skipped beats."  He is on low dose of metoprolol and this has prevented the "skipped beats"  His HR remains 49 to 50 and this is his normal.  No associated symptoms.   He has no chest pain and no SOB.  He feels well.  His wt is down from out records of 11 lbs, he has retired and is Engineer, [employer] a house.  He is staying active but not exercising.  He can walk up 2 flights of steps without chest pain or SOB.  He is feeling well.  He needs arthroscopy by Dr. French Ana.  It is not yet scheduled.     He has OSA and is going to try CPAP again, has recently seen Dr. Annamaria Boots.      Past Medical History:  Diagnosis Date  . Coronary atherosclerosis of unspecified type of vessel, native or graft 2002   s/p 3 vessel PCI  . Diabetes type 2, controlled ([place])    did not return calls to schedule DSME  . Esophageal reflux   . Essential hypertension 10/20/2016  . Ex-smoker    quit 2002  . HLD (hyperlipidemia)   . OSA (obstructive sleep apnea)     CPAP ([place])  . Umbilical hernia     Past Surgical History:  Procedure Laterality Date  . ANAL FISSURE REPAIR  2008  . COLONOSCOPY  06/2012   15 polyps - adenomatous, mild diverticulosis ([place])  . CORONARY STENT PLACEMENT  04/2001   x2 (Dr Orene Desanctis)  . FINGER FRACTURE SURGERY Right 2004   middle finger  . SIGMOIDOSCOPY  1996  . SINUS SURGERY WITH INSTATRAK  2001     Current Outpatient Medications  Medication Sig Dispense Refill  . aspirin EC 81 MG tablet Take 1 tablet (81 mg total) by mouth daily. 90 tablet 3  . Blood Glucose Monitoring Suppl (ONE TOUCH ULTRA SYSTEM KIT) w/Device KIT 1 kit by Does not apply route once. 1 each 0  . esomeprazole (NEXIUM) 40 MG capsule Take 1 capsule (40 mg total) by mouth daily at 12 noon. Pt prefers brand name 90 capsule 3  . LIPITOR 20 MG tablet TAKE 1 TABLET (20 MG TOTAL) BY MOUTH DAILY. 90 tablet 3  . metFORMIN (GLUCOPHAGE) 500 MG tablet TAKE 1 TABLET (500 MG TOTAL) BY MOUTH AT BEDTIME. 90 tablet 0  . metoprolol succinate (TOPROL-XL) 25 MG 24 hr tablet Take 0.5 tablets (12.5 mg total) by mouth daily. 45 tablet 3  . nitroGLYCERIN (NITROSTAT) 0.4 MG SL tablet Place 1 tablet (0.4 mg total) under the tongue every 5 (five) minutes as needed for chest pain (chest pain MAX 3 doses). 25 tablet 11  . ONE TOUCH ULTRA TEST  test strip CHECK TWICE DAILY AND AS NEEDED. E11.65 100 each 0  . ONETOUCH DELICA LANCETS 32Z MISC CHECK TWICE DAILY AND AS NEEDED. E11.65 100 each 0  . sildenafil (VIAGRA) 100 MG tablet Take 0.5 tablets (50 mg total) by mouth daily as needed for erectile dysfunction. 5 tablet 3   No current facility-administered medications for this visit.     Allergies:   Patient has no known allergies.    Social History:  The patient  reports that he quit smoking about 16 years ago. His smoking use included cigarettes. He has a 15.00 pack-year smoking history. he has never used smokeless tobacco. He reports that he does not drink alcohol or use drugs.   Family History:  The patient's family history includes Hypertension in his mother; Lung cancer (age of onset: 43) in his father.    ROS:  General:no colds or fevers, no weight changes Skin:no rashes or ulcers HEENT:no blurred vision, no congestion CV:see HPI PUL:see HPI GI:no diarrhea  constipation or melena, no indigestion GU:no hematuria, no dysuria MS:+ joint pain, no claudication Neuro:no syncope, no lightheadedness Endo:+ diabetes, no thyroid disease  Wt Readings from Last 3 Encounters:  08/13/17 230 lb 12.8 oz (104.7 kg)  08/11/17 230 lb 6.4 oz (104.5 kg)  10/20/16 241 lb 3.2 oz (109.4 kg)     PHYSICAL EXAM: VS:  BP 128/82   Pulse (!) 50   Resp 16   Ht 5' 10" (1.778 m)   Wt 230 lb 12.8 oz (104.7 kg)   SpO2 97%   BMI 33.12 kg/m  , BMI Body mass index is 33.12 kg/m. General:Pleasant affect, NAD Skin:Warm and dry, brisk capillary refill HEENT:normocephalic, sclera clear, mucus membranes moist Neck:supple, no JVD, no bruits  Heart:S1S2 RRR without murmur, gallup, rub or click Lungs:clear without rales, rhonchi, or wheezes YYQ:MGNO, non tender, + BS, do not palpate liver spleen or masses Ext:no lower ext edema, 2+ pedal pulses, 2+ radial pulses Neuro:alert and oriented X 3, MAE, follows commands, + facial symmetry    EKG:  EKG is ordered today. The ekg ordered today demonstrates SB at 79 and no changes from 10/2016    Recent Labs: 10/20/2016: ALT 30; BUN 13; Creatinine, Ser 0.98; Magnesium 1.9; Potassium 4.5; Sodium 137    Lipid Panel    Component Value Date/Time   CHOL 101 10/20/2016 0942   TRIG 86 10/20/2016 0942   HDL 36 (L) 10/20/2016 0942   CHOLHDL 2.8 10/20/2016 0942   CHOLHDL 4 01/10/2016 0914   VLDL 22.6 01/10/2016 0914   LDLCALC 48 10/20/2016 0942   LDLDIRECT 70.8 06/30/2012 1051       Other studies Reviewed: Additional studies/ records that were reviewed today include: in 2007 with normal stress test.   ASSESSMENT AND PLAN:  1.  Pre-op exam for knee arthroscopy - pt is low risk for procedure.  He meets 4 mets without angina.   Chart reviewed as part of pre-operative protocol coverage. Given past medical history and time since last visit, based on ACC/AHA guidelines, ANTWANN PREZIOSI would be at acceptable risk for the planned  procedure without further cardiovascular testing.   I will route this recommendation to the requesting party via Epic fax function and remove from pre-op pool.  Please call with questions.   2.  CAD with hx of stent to RCA 16 years ago and arthrectomy to LCX at same time.  EKG is stable and no recurrent angina.    3.   SBrady at  49-50 this is his normal, he is on low dose BB for skipped beats.  He is asymptomatic with brady.   4.  OSA being re-evaluated for CPAP  5.  HLD stable on statin.  6.   HTN controlled  7. DM per PCP   Current medicines are reviewed with the patient today.  The patient Has no concerns regarding medicines.  The following changes have been made:  See above Labs/ tests ordered today include:see above  Disposition:   FU:  see above  Signed, Cecilie Kicks, NP  08/13/2017 11:32 AM    [facility] [place], [place], [place], [place] Phone: [phone]; Fax: [phone]

## 2017-08-13 ENCOUNTER — Ambulatory Visit: Payer: 59 | Admitting: Cardiology

## 2017-08-13 ENCOUNTER — Encounter: Payer: Self-pay | Admitting: Cardiology

## 2017-08-13 VITALS — BP 128/82 | HR 50 | Resp 16 | Ht 70.0 in | Wt 230.8 lb

## 2017-08-13 DIAGNOSIS — E785 Hyperlipidemia, unspecified: Secondary | ICD-10-CM | POA: Diagnosis not present

## 2017-08-13 DIAGNOSIS — I2510 Atherosclerotic heart disease of native coronary artery without angina pectoris: Secondary | ICD-10-CM

## 2017-08-13 DIAGNOSIS — Z01810 Encounter for preprocedural cardiovascular examination: Secondary | ICD-10-CM

## 2017-08-13 DIAGNOSIS — R001 Bradycardia, unspecified: Secondary | ICD-10-CM | POA: Diagnosis not present

## 2017-08-13 DIAGNOSIS — G4733 Obstructive sleep apnea (adult) (pediatric): Secondary | ICD-10-CM | POA: Diagnosis not present

## 2017-08-13 DIAGNOSIS — I10 Essential (primary) hypertension: Secondary | ICD-10-CM

## 2017-08-13 DIAGNOSIS — Z01818 Encounter for other preprocedural examination: Secondary | ICD-10-CM

## 2017-08-13 NOTE — Patient Instructions (Addendum)
Medication Instructions:  Your physician recommends that you continue on your current medications as directed. Please refer to the Current Medication list given to you today.   Labwork: None ordered  Testing/Procedures: None ordered  Follow-Up: Your physician recommends that you schedule a follow-up appointment in: 11/12/17 @ 10:00 with  Dr. Saunders Revel    Any Other Special Instructions Will Be Listed Below (If Applicable).    If you need a refill on your cardiac medications before your next appointment, please call your pharmacy.

## 2017-08-14 NOTE — Telephone Encounter (Signed)
Pt was seen in the office 08/13/17 by Cecilie Kicks, NP and has been cleared. See office note .

## 2017-08-16 ENCOUNTER — Telehealth: Payer: Self-pay | Admitting: Family Medicine

## 2017-08-16 NOTE — Telephone Encounter (Signed)
Opened in error

## 2017-08-24 ENCOUNTER — Ambulatory Visit: Payer: Commercial Managed Care - HMO | Admitting: Family Medicine

## 2017-08-25 ENCOUNTER — Ambulatory Visit: Payer: Self-pay | Admitting: Family Medicine

## 2017-08-27 ENCOUNTER — Ambulatory Visit: Payer: 59 | Admitting: Family Medicine

## 2017-08-27 ENCOUNTER — Other Ambulatory Visit: Payer: Self-pay | Admitting: Family Medicine

## 2017-08-27 ENCOUNTER — Encounter: Payer: Self-pay | Admitting: Family Medicine

## 2017-08-27 VITALS — BP 136/80 | HR 52 | Temp 97.8°F | Wt 226.0 lb

## 2017-08-27 DIAGNOSIS — E785 Hyperlipidemia, unspecified: Secondary | ICD-10-CM | POA: Diagnosis not present

## 2017-08-27 DIAGNOSIS — E669 Obesity, unspecified: Secondary | ICD-10-CM | POA: Diagnosis not present

## 2017-08-27 DIAGNOSIS — E118 Type 2 diabetes mellitus with unspecified complications: Secondary | ICD-10-CM | POA: Diagnosis not present

## 2017-08-27 DIAGNOSIS — E66811 Obesity, class 1: Secondary | ICD-10-CM

## 2017-08-27 DIAGNOSIS — I10 Essential (primary) hypertension: Secondary | ICD-10-CM

## 2017-08-27 DIAGNOSIS — Z01818 Encounter for other preprocedural examination: Secondary | ICD-10-CM | POA: Diagnosis not present

## 2017-08-27 DIAGNOSIS — G4733 Obstructive sleep apnea (adult) (pediatric): Secondary | ICD-10-CM | POA: Diagnosis not present

## 2017-08-27 DIAGNOSIS — I2510 Atherosclerotic heart disease of native coronary artery without angina pectoris: Secondary | ICD-10-CM | POA: Diagnosis not present

## 2017-08-27 LAB — LIPID PANEL
CHOL/HDL RATIO: 3
CHOLESTEROL: 105 mg/dL (ref 0–200)
HDL: 36 mg/dL — ABNORMAL LOW (ref >39.00)
LDL CALC: 47 mg/dL (ref 0–99)
NonHDL: 69.39
TRIGLYCERIDES: 111 mg/dL (ref 0.0–149.0)
VLDL: 22.2 mg/dL (ref 0.0–40.0)

## 2017-08-27 LAB — CBC WITH DIFFERENTIAL/PLATELET
Basophils Absolute: 0 10*3/uL (ref 0.0–0.1)
Basophils Relative: 0.3 % (ref 0.0–3.0)
EOS PCT: 1.3 % (ref 0.0–5.0)
Eosinophils Absolute: 0.1 10*3/uL (ref 0.0–0.7)
HCT: 43.4 % (ref 39.0–52.0)
Hemoglobin: 14.8 g/dL (ref 13.0–17.0)
LYMPHS ABS: 2.2 10*3/uL (ref 0.7–4.0)
Lymphocytes Relative: 24.2 % (ref 12.0–46.0)
MCHC: 34.1 g/dL (ref 30.0–36.0)
MCV: 90.8 fl (ref 78.0–100.0)
MONOS PCT: 7.7 % (ref 3.0–12.0)
Monocytes Absolute: 0.7 10*3/uL (ref 0.1–1.0)
NEUTROS ABS: 6 10*3/uL (ref 1.4–7.7)
NEUTROS PCT: 66.5 % (ref 43.0–77.0)
PLATELETS: 209 10*3/uL (ref 150.0–400.0)
RBC: 4.78 Mil/uL (ref 4.22–5.81)
RDW: 12.9 % (ref 11.5–15.5)
WBC: 9 10*3/uL (ref 4.0–10.5)

## 2017-08-27 LAB — BASIC METABOLIC PANEL
BUN: 11 mg/dL (ref 6–23)
CO2: 32 meq/L (ref 19–32)
Calcium: 9 mg/dL (ref 8.4–10.5)
Chloride: 101 mEq/L (ref 96–112)
Creatinine, Ser: 0.91 mg/dL (ref 0.40–1.50)
GFR: 90.11 mL/min (ref >60.00)
GLUCOSE: 178 mg/dL — AB (ref 70–99)
POTASSIUM: 4.4 meq/L (ref 3.5–5.1)
SODIUM: 137 meq/L (ref 135–145)

## 2017-08-27 LAB — HEMOGLOBIN A1C: Hgb A1c MFr Bld: 8.5 % — ABNORMAL HIGH (ref 4.6–6.5)

## 2017-08-27 LAB — MICROALBUMIN / CREATININE URINE RATIO
Creatinine,U: 157.1 mg/dL
Microalb Creat Ratio: 0.6 mg/g (ref 0.0–30.0)
Microalb, Ur: 0.9 mg/dL (ref 0.0–1.9)

## 2017-08-27 NOTE — Assessment & Plan Note (Signed)
Chronic, stable. Continue current regimen. 

## 2017-08-27 NOTE — Assessment & Plan Note (Signed)
Cleared by cardiology. Continues aspirin, statin, beta blocker.

## 2017-08-27 NOTE — Assessment & Plan Note (Signed)
Congratulated on weight loss to date - pt attributes to more regular meal routine with less snacks since retirement.

## 2017-08-27 NOTE — Assessment & Plan Note (Addendum)
Has already received cardiac and pulmonary clearance. Here for diabetes eval (overdue since 01/2016). Check labs today. Discussed ideal goal A1c <7% for surgery. Otherwise anticipate adequately low risk to proceed with surgery.

## 2017-08-27 NOTE — Patient Instructions (Addendum)
Labs today.  Return in 3-4 months for physical. Good to see you today.  We will send clearance and plan for metformin after lab work.

## 2017-08-27 NOTE — Assessment & Plan Note (Addendum)
Chronic, trouble tolerating metformin due to ongoing diarreha. Endorses compliance, last filled #90 with RF:0 08/2016. Will check labs today. Discussed ER formulation which we may start pending labs. Declines DSME referral. Endorses UTD eye exam - chart updated. Encouraged close adherence to diabetic diet. Discussed glycemic control in relation to upcoming surgery.

## 2017-08-27 NOTE — Assessment & Plan Note (Signed)
Chronic on lipitor. Update FLP.

## 2017-08-27 NOTE — Progress Notes (Signed)
BP 136/80 (BP Location: Left Arm, Patient Position: Sitting, Cuff Size: Large)   Pulse (!) 52   Temp 97.8 F (36.6 C) (Oral)   Wt 226 lb (102.5 kg)   SpO2 96%   BMI 32.43 kg/m    CC: preop evaluation Subjective:    Patient ID: [name], male    DOB: 12/31/1955, 61 y.o.   MRN: [id number]  HPI: [name] is a 61 y.o. 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 Has been working on weight loss. Upcoming L knee arthroscopy by Dr French Ana for torn meniscus. Has tolerated anesthesia well in the past. Had sinus surgery 2001.   Saw cardiology for cardiac clearance, note reviewed. H/o CAD s/p BMS to LAD and RCA, balloon angioplasty to LCx.  Saw pulmonology for pulm clearance, note reviewed. H/o OSA that did improve with weight loss. Pending new home sleep study ([place]).   DM - does check sugars intermittently - last night before bedtime 178, fasting up to 200. Compliant with antihyperglycemic regimen which includes: metformin 541m QAM. Ongoing diarrhea with this. Denies low sugars or hypoglycemic symptoms. Denies paresthesias. Last diabetic eye exam 09/2016 per patient. Pneumovax: 01/2016. Prevnar: not due. Glucometer brand: one-touch. DSME: not interested. Foot exam today.  Lab Results  Component Value Date   HGBA1C 7.3 (H) 11/09/2015   Diabetic Foot Exam - Simple   Simple Foot Form Diabetic Foot exam was performed with the following findings:  Yes 08/27/2017  9:20 AM  Visual Inspection No deformities, no ulcerations, no other skin breakdown bilaterally:  Yes Sensation Testing Intact to touch and monofilament testing bilaterally:  Yes Pulse Check Posterior Tibialis and Dorsalis pulse intact bilaterally:  Yes Comments Elongated toenails    Lab Results  Component Value Date   MICROALBUR 0.7 11/09/2015     Overdue for colonoscopy - knows he  needs to reschedule.  Ongoing trouble with L sided tonsilloliths.   Relevant past medical, surgical, family and social history reviewed and updated as indicated. Interim medical history since our last visit reviewed. Allergies and medications reviewed and updated. Outpatient Medications Prior to Visit  Medication Sig Dispense Refill  . aspirin EC 81 MG tablet Take 1 tablet (81 mg total) by mouth daily. 90 tablet 3  . Blood Glucose Monitoring Suppl (ONE TOUCH ULTRA SYSTEM KIT) w/Device KIT 1 kit by Does not apply route once. 1 each 0  . esomeprazole (NEXIUM) 40 MG capsule Take 1 capsule (40 mg total) by mouth daily at 12 noon. Pt prefers brand name 90 capsule 3  . LIPITOR 20 MG tablet TAKE 1 TABLET (20 MG TOTAL) BY MOUTH DAILY. 90 tablet 3  . metFORMIN (GLUCOPHAGE) 500 MG tablet TAKE 1 TABLET (500 MG TOTAL) BY MOUTH AT BEDTIME. 90 tablet 0  . metoprolol succinate (TOPROL-XL) 25 MG 24 hr tablet Take 0.5 tablets (12.5 mg total) by mouth daily. 45 tablet 3  . nitroGLYCERIN (NITROSTAT) 0.4 MG SL tablet Place 1 tablet (0.4 mg total) under the tongue every 5 (five) minutes as needed for chest pain (chest pain MAX 3 doses). 25 tablet 11  . ONE TOUCH ULTRA TEST test strip CHECK TWICE DAILY AND AS NEEDED. E11.65 100 each 0  . ONETOUCH DELICA LANCETS 334JMISC CHECK TWICE DAILY AND AS NEEDED. E11.65 100 each 0  . sildenafil (VIAGRA) 100 MG tablet Take 0.5 tablets (50 mg total) by  mouth daily as needed for erectile dysfunction. 5 tablet 3   No facility-administered medications prior to visit.      Per HPI unless specifically indicated in ROS section below Review of Systems     Objective:    BP 136/80 (BP Location: Left Arm, Patient Position: Sitting, Cuff Size: Large)   Pulse (!) 52   Temp 97.8 F (36.6 C) (Oral)   Wt 226 lb (102.5 kg)   SpO2 96%   BMI 32.43 kg/m   Wt Readings from Last 3 Encounters:  08/27/17 226 lb (102.5 kg)  08/13/17 230 lb 12.8 oz (104.7 kg)  08/11/17 230 lb 6.4 oz  (104.5 kg)    Physical Exam  Constitutional: He appears well-developed and well-nourished. No distress.  HENT:  Head: Normocephalic and atraumatic.  Right Ear: External ear normal.  Left Ear: External ear normal.  Nose: Nose normal.  Mouth/Throat: Oropharynx is clear and moist. No oropharyngeal exudate.  Eyes: Conjunctivae and EOM are normal. Pupils are equal, round, and reactive to light. No scleral icterus.  Neck: Normal range of motion. Neck supple. No thyromegaly present.  Cardiovascular: Normal rate, regular rhythm, normal heart sounds and intact distal pulses.  No murmur heard. Pulmonary/Chest: Effort normal and breath sounds normal. No respiratory distress. He has no wheezes. He has no rales.  Musculoskeletal: He exhibits no edema.  See HPI for foot exam if done  Lymphadenopathy:    He has no cervical adenopathy.  Skin: Skin is warm and dry. No rash noted.  Psychiatric: He has a normal mood and affect.  Nursing note and vitals reviewed.  Results for orders placed or performed in visit on 08/27/17  HM DIABETES EYE EXAM  Result Value Ref Range   HM Diabetic Eye Exam No Retinopathy No Retinopathy      Assessment & Plan:   Problem List Items Addressed This Visit    Coronary atherosclerosis    Cleared by cardiology. Continues aspirin, statin, beta blocker.       Diabetes mellitus type 2, controlled, with complications ([place])    Chronic, trouble tolerating metformin due to ongoing diarreha. Endorses compliance, last filled #90 with RF:0 08/2016. Will check labs today. Discussed ER formulation which we may start pending labs. Declines DSME referral. Endorses UTD eye exam - chart updated. Encouraged close adherence to diabetic diet. Discussed glycemic control in relation to upcoming surgery.       Relevant Orders   Lipid panel   Hemoglobin A1c   Microalbumin / creatinine urine ratio   Basic metabolic panel   Essential hypertension    Chronic, stable. Continue current  regimen.       Hyperlipidemia    Chronic on lipitor. Update FLP.       Obesity, Class I, BMI 30.0-34.9 (see actual BMI)    Congratulated on weight loss to date - pt attributes to more regular meal routine with less snacks since retirement.       Obstructive sleep apnea   Preoperative clearance - Primary    Has already received cardiac and pulmonary clearance. Here for diabetes eval (overdue since 01/2016). Check labs today. Discussed ideal goal A1c <7% for surgery. Otherwise anticipate adequately low risk to proceed with surgery.       Relevant Orders   CBC with Differential/Platelet       Follow up plan: Return in about 4 months (around 12/26/2017), or if symptoms worsen or fail to improve, for annual exam, prior fasting for blood work.  Ria Bush, MD

## 2017-08-28 MED ORDER — ONETOUCH DELICA LANCETS 33G MISC: 1.0000 | Freq: Two times a day (BID) | 4 refills | Status: DC | PRN

## 2017-08-28 MED ORDER — GLUCOSE BLOOD VI STRP: ORAL_STRIP | 4 refills | Status: DC

## 2017-08-29 ENCOUNTER — Other Ambulatory Visit: Payer: Self-pay | Admitting: Family Medicine

## 2017-08-29 MED ORDER — METFORMIN HCL ER 500 MG PO TB24: 1000.0000 mg | ORAL_TABLET | Freq: Every day | ORAL | 3 refills | Status: DC

## 2017-09-01 DIAGNOSIS — G4733 Obstructive sleep apnea (adult) (pediatric): Secondary | ICD-10-CM | POA: Diagnosis not present

## 2017-09-02 DIAGNOSIS — G4733 Obstructive sleep apnea (adult) (pediatric): Secondary | ICD-10-CM | POA: Diagnosis not present

## 2017-09-09 ENCOUNTER — Other Ambulatory Visit: Payer: Self-pay | Admitting: *Deleted

## 2017-09-09 DIAGNOSIS — G4733 Obstructive sleep apnea (adult) (pediatric): Secondary | ICD-10-CM

## 2017-09-16 DIAGNOSIS — G8918 Other acute postprocedural pain: Secondary | ICD-10-CM | POA: Diagnosis not present

## 2017-09-16 DIAGNOSIS — S83242A Other tear of medial meniscus, current injury, left knee, initial encounter: Secondary | ICD-10-CM | POA: Diagnosis not present

## 2017-09-16 DIAGNOSIS — S83232A Complex tear of medial meniscus, current injury, left knee, initial encounter: Secondary | ICD-10-CM | POA: Diagnosis not present

## 2017-09-21 DIAGNOSIS — S83231D Complex tear of medial meniscus, current injury, right knee, subsequent encounter: Secondary | ICD-10-CM | POA: Diagnosis not present

## 2017-09-21 DIAGNOSIS — M25562 Pain in left knee: Secondary | ICD-10-CM | POA: Diagnosis not present

## 2017-09-24 DIAGNOSIS — S83231D Complex tear of medial meniscus, current injury, right knee, subsequent encounter: Secondary | ICD-10-CM | POA: Diagnosis not present

## 2017-09-24 DIAGNOSIS — M25562 Pain in left knee: Secondary | ICD-10-CM | POA: Diagnosis not present

## 2017-09-29 DIAGNOSIS — M25562 Pain in left knee: Secondary | ICD-10-CM | POA: Diagnosis not present

## 2017-09-29 DIAGNOSIS — S83231D Complex tear of medial meniscus, current injury, right knee, subsequent encounter: Secondary | ICD-10-CM | POA: Diagnosis not present

## 2017-10-02 DIAGNOSIS — M25562 Pain in left knee: Secondary | ICD-10-CM | POA: Diagnosis not present

## 2017-10-02 DIAGNOSIS — S83231D Complex tear of medial meniscus, current injury, right knee, subsequent encounter: Secondary | ICD-10-CM | POA: Diagnosis not present

## 2017-10-06 DIAGNOSIS — S83231D Complex tear of medial meniscus, current injury, right knee, subsequent encounter: Secondary | ICD-10-CM | POA: Diagnosis not present

## 2017-10-06 DIAGNOSIS — M25562 Pain in left knee: Secondary | ICD-10-CM | POA: Diagnosis not present

## 2017-10-08 DIAGNOSIS — M25562 Pain in left knee: Secondary | ICD-10-CM | POA: Diagnosis not present

## 2017-10-08 DIAGNOSIS — S83231D Complex tear of medial meniscus, current injury, right knee, subsequent encounter: Secondary | ICD-10-CM | POA: Diagnosis not present

## 2017-10-18 ENCOUNTER — Other Ambulatory Visit: Payer: Self-pay | Admitting: Internal Medicine

## 2017-10-19 ENCOUNTER — Encounter: Payer: Self-pay | Admitting: Internal Medicine

## 2017-10-19 ENCOUNTER — Ambulatory Visit: Payer: 59 | Admitting: Internal Medicine

## 2017-10-19 ENCOUNTER — Other Ambulatory Visit: Payer: Self-pay | Admitting: Internal Medicine

## 2017-10-19 VITALS — BP 120/66 | HR 56 | Ht 70.0 in | Wt 231.4 lb

## 2017-10-19 DIAGNOSIS — I10 Essential (primary) hypertension: Secondary | ICD-10-CM | POA: Diagnosis not present

## 2017-10-19 DIAGNOSIS — I2510 Atherosclerotic heart disease of native coronary artery without angina pectoris: Secondary | ICD-10-CM

## 2017-10-19 DIAGNOSIS — N529 Male erectile dysfunction, unspecified: Secondary | ICD-10-CM | POA: Diagnosis not present

## 2017-10-19 DIAGNOSIS — K219 Gastro-esophageal reflux disease without esophagitis: Secondary | ICD-10-CM

## 2017-10-19 DIAGNOSIS — I25118 Atherosclerotic heart disease of native coronary artery with other forms of angina pectoris: Secondary | ICD-10-CM | POA: Insufficient documentation

## 2017-10-19 DIAGNOSIS — E785 Hyperlipidemia, unspecified: Secondary | ICD-10-CM

## 2017-10-19 MED ORDER — OXYCODONE-ACETAMINOPHEN 5-325 MG PO TABS: 1.0000 | ORAL_TABLET | ORAL | 0 refills | Status: DC

## 2017-10-19 MED ORDER — ONETOUCH DELICA LANCETS 33G MISC: 1.0000 | Freq: Two times a day (BID) | 4 refills | Status: DC | PRN

## 2017-10-19 MED ORDER — LIPITOR 20 MG PO TABS: ORAL_TABLET | ORAL | 3 refills | Status: DC

## 2017-10-19 MED ORDER — SILDENAFIL CITRATE 100 MG PO TABS: 50.0000 mg | ORAL_TABLET | Freq: Every day | ORAL | 3 refills | Status: DC | PRN

## 2017-10-19 MED ORDER — ESOMEPRAZOLE MAGNESIUM 40 MG PO CPDR: 40.0000 mg | DELAYED_RELEASE_CAPSULE | Freq: Every day | ORAL | 3 refills | Status: DC

## 2017-10-19 MED ORDER — NITROGLYCERIN 0.4 MG SL SUBL: 0.4000 mg | SUBLINGUAL_TABLET | SUBLINGUAL | 11 refills | Status: DC | PRN

## 2017-10-19 MED ORDER — METOPROLOL SUCCINATE ER 25 MG PO TB24: 12.5000 mg | ORAL_TABLET | Freq: Every day | ORAL | 3 refills | Status: DC

## 2017-10-19 NOTE — Telephone Encounter (Signed)
Please review for refill. Thanks!  

## 2017-10-19 NOTE — Patient Instructions (Signed)
Medication Instructions:  Your physician recommends that you continue on your current medications as directed. Please refer to the Current Medication list given to you today.  -- If you need a refill on your cardiac medications before your next appointment, please call your pharmacy. --  Labwork: None ordered  Testing/Procedures: None ordered  Follow-Up: Your physician wants you to follow-up in: 1 year with Dr. End.    You will receive a reminder letter in the mail two months in advance. If you don't receive a letter, please call our office to schedule the follow-up appointment.  Thank you for choosing [facility]!!    Any Other Special Instructions Will Be Listed Below (If Applicable).         

## 2017-10-19 NOTE — Progress Notes (Signed)
Follow-up Outpatient Visit Date: 10/19/2017  Primary Care Provider: Ria Bush, MD [place] [address]  Chief Complaint: Follow-up coronary artery disease  HPI:  Mr. Ciszewski is a 62 y.o. year-old male with history of coronary artery disease s/p BMS to the LAD and RCA, as well as balloon angioplasty to LCx, hyperlipidemia, GERD and OSA on CPAP, who presents for follow-up of coronary artery disease. I last saw him in 10/2016, at which time he was doing well with the exception of myalgias/cramps that he attributed to PPI use. He was evaluated in our office by Cecilie Kicks, PA, in 07/2017, for preop evaluation in anticipation of knee arthroscopy. He was doing well at that time from a heart standpoint.  Today, Mr. Digioia is without complaints.  He tolerated his knee arthroscopy well and reports that his knee pain has improved considerably.  He was a little bit short of breath before the surgery but reports that this has since resolved.  He denies chest pain, shortness of breath, palpitations, lightheadedness, orthopnea, PND, and edema.  He underwent home sleep study in December, which revealed severe sleep apnea.  He is scheduled to follow-up with Dr. Annamaria Boots in March to review the results and discuss initiation of CPAP.  --------------------------------------------------------------------------------------------------  Cardiovascular History & Procedures: Cardiovascular Problems:  CAD s/p PCI/POBA  Risk Factors:  Known CAD, hypertension, hyperlipidemia, male gender, and age > 35  Cath/PCI:  PCI (05/05/01): PCI to mid RCA with NIR 3.0 x 13 mm BMS (post-dilated with 3.25 mm balloon). POBA to mid LCx with 2.25 mm cutting balloon.  PCI (05/03/01): PCI to proximal LAD CTO with Pixel 2.5 x 23 mm BMS (post-dilated with 3.0 mm balloon); POBA to mid LAD stenosis with 2.25 mm balloon  LHC (04/30/01): LMCA normal. LAD with CTO of proximal vessel.  LCx with large OM1  followed by 90% stenosis in small AV groove LCx.  Dominant RCA with 90% midvessel stenosis.  Preserved LVEF with mid/apical anterior akinesis.  CV Surgery:  None  EP Procedures and Devices:  None   Non-Invasive Evaluation(s):  Exercise myocardial perfusion stress test (12/23/05): Fair exercise capacity without evidence of ischemia or scar. LVEF 53%.  Recent CV Pertinent Labs: Lab Results  Component Value Date   CHOL 105 08/27/2017   CHOL 101 10/20/2016   HDL 36.00 (L) 08/27/2017   HDL 36 (L) 10/20/2016   LDLCALC 47 08/27/2017   LDLCALC 48 10/20/2016   LDLDIRECT 70.8 06/30/2012   TRIG 111.0 08/27/2017   CHOLHDL 3 08/27/2017   K 4.4 08/27/2017   MG 1.9 10/20/2016   BUN 11 08/27/2017   BUN 13 10/20/2016   CREATININE 0.91 08/27/2017   CREATININE 0.91 07/13/2015    Past medical and surgical history were reviewed and updated in EPIC.  Current Meds  Medication Sig  . aspirin EC 81 MG tablet Take 1 tablet (81 mg total) by mouth daily.  . Blood Glucose Monitoring Suppl (ONE TOUCH ULTRA SYSTEM KIT) w/Device KIT 1 kit by Does not apply route once.  Marland Kitchen esomeprazole (NEXIUM) 40 MG capsule Take 1 capsule (40 mg total) by mouth daily at 12 noon. Pt prefers brand name  . glucose blood (ONE TOUCH ULTRA TEST) test strip Use as instructedCHECK TWICE DAILY AND AS NEEDED. E11.65  . LIPITOR 20 MG tablet TAKE 1 TABLET (20 MG TOTAL) BY MOUTH DAILY.  . metFORMIN (GLUCOPHAGE-XR) 500 MG 24 hr tablet Take 2 tablets (1,000 mg total) by mouth daily with breakfast.  .  metoprolol succinate (TOPROL-XL) 25 MG 24 hr tablet Take 0.5 tablets (12.5 mg total) by mouth daily.  . nitroGLYCERIN (NITROSTAT) 0.4 MG SL tablet Place 1 tablet (0.4 mg total) under the tongue every 5 (five) minutes as needed for chest pain (chest pain MAX 3 doses).  Glory Rosebush DELICA LANCETS 46F MISC 1 each by Other route 2 (two) times daily as needed.  Marland Kitchen oxyCODONE-acetaminophen (PERCOCET/ROXICET) 5-325 MG tablet Take 1-2 tablets by  mouth as directed. for pain  . sildenafil (VIAGRA) 100 MG tablet Take 0.5 tablets (50 mg total) by mouth daily as needed for erectile dysfunction.    Allergies: Patient has no known allergies.  Social History   Socioeconomic History  . Marital status: Married    Spouse name: Not on file  . Number of children: Not on file  . Years of education: Not on file  . Highest education level: Not on file  Social Needs  . Financial resource strain: Not on file  . Food insecurity - worry: Not on file  . Food insecurity - inability: Not on file  . Transportation needs - medical: Not on file  . Transportation needs - non-medical: Not on file  Occupational History  . Occupation: Civil engineer, contracting: [employer]  Tobacco Use  . Smoking status: Former Smoker    Packs/day: 1.00    Years: 15.00    Pack years: 15.00    Types: Cigarettes    Last attempt to quit: 09/15/2000    Years since quitting: 17.1  . Smokeless tobacco: Never Used  Substance and Sexual Activity  . Alcohol use: No  . Drug use: No  . Sexual activity: Not on file  Other Topics Concern  . Not on file  Social History Narrative   Caffeine: 1 pepsi/day   Lives with wife, 1 dog, 2 cats.  Grown daughter.   Occupation: Dealer   Edu: HS   Activity: work 12 hour days, gardens and fishes   Diet: good water daily, fruits/vegetables daily    Family History  Problem Relation Age of Onset  . Lung cancer Father 71       smoker  . Hypertension Mother   . Diabetes Neg Hx   . Coronary artery disease Neg Hx   . Stroke Neg Hx   . Colon cancer Neg Hx   . Esophageal cancer Neg Hx   . Rectal cancer Neg Hx   . Stomach cancer Neg Hx     Review of Systems: A 12-system review of systems was performed and was negative except as noted in the HPI.  --------------------------------------------------------------------------------------------------  Physical Exam: BP 120/66   Pulse (!) 56   Ht _0  (1.778 m)    Wt 231 lb 6.4 oz (105 kg)   BMI 33.20 kg/m   General: Obese man, seated comfortably in the exam room. HEENT: No conjunctival pallor or scleral icterus. Moist mucous membranes.  OP clear. Neck: Supple without lymphadenopathy, thyromegaly, JVD, or HJR. No carotid bruit. Lungs: Normal work of breathing. Clear to auscultation bilaterally without wheezes or crackles. Heart: Regular rate and rhythm without murmurs, rubs, or gallops. Non-displaced PMI. Abd: Bowel sounds present. Soft, NT/ND without hepatosplenomegaly Ext: No lower extremity edema. Radial, PT, and DP pulses are 2+ bilaterally. Skin: Warm and dry without rash.  Lab Results  Component Value Date   WBC 9.0 08/27/2017   HGB 14.8 08/27/2017   HCT 43.4 08/27/2017   MCV 90.8 08/27/2017   PLT 209.0 08/27/2017  Lab Results  Component Value Date   NA 137 08/27/2017   K 4.4 08/27/2017   CL 101 08/27/2017   CO2 32 08/27/2017   BUN 11 08/27/2017   CREATININE 0.91 08/27/2017   GLUCOSE 178 (H) 08/27/2017   ALT 30 10/20/2016    Lab Results  Component Value Date   CHOL 105 08/27/2017   HDL 36.00 (L) 08/27/2017   LDLCALC 47 08/27/2017   LDLDIRECT 70.8 06/30/2012   TRIG 111.0 08/27/2017   CHOLHDL 3 08/27/2017    --------------------------------------------------------------------------------------------------  ASSESSMENT AND PLAN: Coronary artery disease without angina No symptoms to suggest worsening coronary insufficiency.  Of note, symptoms prior to his cardiac events in early 2000's were chest pain.  We have agreed to continue Mr. Westrup current medications for secondary prevention, including aspirin, metoprolol, and atorvastatin.  He will contact us if he has any recurrent shortness of breath or develops chest pain.  Hypertension Blood pressure is well controlled today.  No medication changes at this time.  Hyperlipidemia LDL was at goal when last checked in 08/2017.  We have agreed to continue atorvastatin 20 mg  daily.  GERD Symptoms well controlled with Nexium.  Refill provided today.  Erectile dysfunction Adequately managed with sildenafil.  Prescription provided today.  Mr. Alderman was counseled not to take nitroglycerin if he has used sildenafil within the preceding 24 hours.  Follow-up: Return to clinic in 1 year.  Nelva Bush, MD 10/19/2017 9:36 AM

## 2017-10-26 ENCOUNTER — Telehealth: Payer: Self-pay

## 2017-10-27 ENCOUNTER — Telehealth: Payer: Self-pay

## 2017-10-27 NOTE — Telephone Encounter (Signed)
I have reviewed prescription for oxycodone-APAP given to [name] on 10/19/17. Prescription was printed in error as part of refill of his chronic cardiac medications. I have spoken with [name] as well as his pharmacy ([facility]) and have cancelled the prescription. He does not currently need any pain medication. If he feels that an analgesic is needed, I advised him to contact his PCP for further evaluation. He is in agreement with this plan.  Nelva Bush, MD [facility] Pager: [phone]

## 2017-10-27 NOTE — Telephone Encounter (Signed)
**Note De-Identified Tahmir Kleckner Obfuscation** We received a Oxycodone prior authorization request from [facility].  The Rx was written on 10/19/17 and the pt was seen for an office visit that day with Dr End.   There is no mention of Oxycodone in the office notes from 10/19/17.  Will forward message to Dr End for advisement on refilling/prior authorization for Oxycodone as this is not a cardiac medication.

## 2017-11-12 ENCOUNTER — Ambulatory Visit: Payer: 59 | Admitting: Internal Medicine

## 2017-12-07 ENCOUNTER — Encounter: Payer: Self-pay | Admitting: Internal Medicine

## 2017-12-07 ENCOUNTER — Ambulatory Visit: Payer: 59 | Admitting: Internal Medicine

## 2017-12-07 VITALS — BP 126/68 | HR 57 | Ht 70.0 in | Wt 230.2 lb

## 2017-12-07 DIAGNOSIS — M531 Cervicobrachial syndrome: Secondary | ICD-10-CM | POA: Diagnosis not present

## 2017-12-07 DIAGNOSIS — G4733 Obstructive sleep apnea (adult) (pediatric): Secondary | ICD-10-CM

## 2017-12-07 DIAGNOSIS — R0600 Dyspnea, unspecified: Secondary | ICD-10-CM

## 2017-12-07 DIAGNOSIS — M50223 Other cervical disc displacement at C6-C7 level: Secondary | ICD-10-CM | POA: Diagnosis not present

## 2017-12-07 DIAGNOSIS — R0609 Other forms of dyspnea: Secondary | ICD-10-CM

## 2017-12-07 DIAGNOSIS — M9901 Segmental and somatic dysfunction of cervical region: Secondary | ICD-10-CM | POA: Diagnosis not present

## 2017-12-07 NOTE — Assessment & Plan Note (Signed)
Aerobic activity is limited by his arthritis.  Office spirometry suggests only a mild obstructive and restrictive pattern.  Other factors were not measured.

## 2017-12-07 NOTE — Progress Notes (Signed)
HPI male former smoker followed for OSA, complicated by CAD/ 3vPCI, DM2, GERD, HBP, hyperlipidemia NPSG 11/30/07 - AHI 70/ hr weight 260 lbs Office Spirometry 08/11/17-mild restriction of exhaled volume-FVC 3.40/71%, FEV1 2.79/77%, ratio 0.82, FEF 25-75% 3.52/118% HST 01/30/17-AHI 72.3/hour, desaturation to 76%, body weight 230 pounds --------------------------------------------------------------------------------------------------- 08/11/17-62 year old male former smoker for sleep evaluation. Medical problem list includes CAD/ 3v PCI, DM 2, GERD, OSA, HBP, hyperlipidemia NPSG 11/30/07 - AHI 70/ hr weight 260 lbs I had last seen in 2013 when body weight was 242 pounds.  Was working rotating shifts as a Therapist, occupational.  He was using CPAP auto/[employer].  History of repaired nasal fracture. -----Sleep Consult: pt due for knee surgery and needs clearance for OSA; Sleep Study well over 10 years ago. Not able to tolerate CPAP machine in past-problems with mask fittings,etc. He has retired and no longer deals with rotating shifts.  Has lost about 30 pounds since last visit.  Less dust exposure and less sinus trouble.  Wife tells him he snores less and he does not notice much daytime sleepiness anymore.  Admits occasional sneeze but no coughing.  Not much aware of dyspnea on exertion.  Office Spirometry 08/11/17-mild restriction of exhaled volume-FVC 3.40/71%, FEV1 2.79/77%, ratio 0.82, FEF 25-75% 3.52/118%  12/07/17-62 year old male former smoker followed for OSA, complicated by CAD/ 3vPCI, DM2, GERD, HBP, hyperlipidemia HST 01/30/17-AHI 72.3/hour, desaturation to 76%, body weight 230 pounds ----OSA; Pt here to review HST results. We discussed his sleep study and treatment options.  He is motivated to try CPAP again to make it work.  Problem before was mainly with mask fit.  ROS-see HPI + = positive Constitutional:    weight loss, night sweats, fevers, chills, fatigue, lassitude. HEENT:    headaches,  difficulty swallowing, tooth/dental problems, sore throat,       sneezing, itching, ear ache, + nasal congestion, post nasal drip, snoring CV:    chest pain, orthopnea, PND, swelling in lower extremities, anasarca,                                                           dizziness, palpitations Resp:   shortness of breath with exertion or at rest.                productive cough,   non-productive cough, coughing up of blood.              change in color of mucus.  wheezing.   Skin:    rash or lesions. GI:  No-   heartburn, indigestion, abdominal pain, nausea, vomiting, diarrhea,                 change in bowel habits, loss of appetite GU: dysuria, change in color of urine, no urgency or frequency.   flank pain. MS:   + Joint pain, stiffness, decreased range of motion, back pain. Neuro-     nothing unusual Psych:  change in mood or affect.  depression or anxiety.   memory loss.  OBJ- Physical Exam General- Alert, Oriented, Affect-appropriate, Distress- none acute Skin- rash-none, lesions- none, excoriation- none Lymphadenopathy- none Head- atraumatic, + small mandible            Eyes- Gross vision intact, PERRLA, conjunctivae and secretions clear  Ears- Hearing, canals-normal            Nose-+ turbinate edema +-Septal dev, mucus, polyps, erosion, perforation             Throat- Mallampati III , mucosa clear , drainage- none, tonsils- atrophic Neck- flexible , trachea midline, no stridor , thyroid nl, carotid no bruit Chest - symmetrical excursion , unlabored           Heart/CV- RRR , no murmur , no gallop  , no rub, nl s1 s2                           - JVD- none , edema- none, stasis changes- none, varices- none           Lung- clear to P&A, wheeze- none, cough- none , dullness-none, rub- none           Chest wall-  Abd-  Br/ Gen/ Rectal- Not done, not indicated Extrem- cyanosis- none, clubbing, none, atrophy- none, strength- nl Neuro- grossly intact to  observation

## 2017-12-07 NOTE — Patient Instructions (Signed)
Order- new DME ( in [place]), new CPAP auto 5-20, mask of choice, humidifier, supplies, AirView     Dx OSA  Please call as needed

## 2017-12-07 NOTE — Assessment & Plan Note (Signed)
He understands he needs to treat his sleep apnea.  CPAP is the best choice for him with his severe OSA.  He will need CPAP in recovery after surgeries. Plan-start CPAP, establishing him with a DME company closer to his home in [place], auto 5-20.

## 2017-12-09 DIAGNOSIS — M9901 Segmental and somatic dysfunction of cervical region: Secondary | ICD-10-CM | POA: Diagnosis not present

## 2017-12-09 DIAGNOSIS — M531 Cervicobrachial syndrome: Secondary | ICD-10-CM | POA: Diagnosis not present

## 2017-12-09 DIAGNOSIS — M50223 Other cervical disc displacement at C6-C7 level: Secondary | ICD-10-CM | POA: Diagnosis not present

## 2017-12-11 DIAGNOSIS — M531 Cervicobrachial syndrome: Secondary | ICD-10-CM | POA: Diagnosis not present

## 2017-12-11 DIAGNOSIS — M50223 Other cervical disc displacement at C6-C7 level: Secondary | ICD-10-CM | POA: Diagnosis not present

## 2017-12-11 DIAGNOSIS — M9901 Segmental and somatic dysfunction of cervical region: Secondary | ICD-10-CM | POA: Diagnosis not present

## 2017-12-14 DIAGNOSIS — M531 Cervicobrachial syndrome: Secondary | ICD-10-CM | POA: Diagnosis not present

## 2017-12-14 DIAGNOSIS — M9901 Segmental and somatic dysfunction of cervical region: Secondary | ICD-10-CM | POA: Diagnosis not present

## 2017-12-14 DIAGNOSIS — M50223 Other cervical disc displacement at C6-C7 level: Secondary | ICD-10-CM | POA: Diagnosis not present

## 2017-12-16 DIAGNOSIS — G4733 Obstructive sleep apnea (adult) (pediatric): Secondary | ICD-10-CM | POA: Diagnosis not present

## 2017-12-17 DIAGNOSIS — M9901 Segmental and somatic dysfunction of cervical region: Secondary | ICD-10-CM | POA: Diagnosis not present

## 2017-12-17 DIAGNOSIS — M50223 Other cervical disc displacement at C6-C7 level: Secondary | ICD-10-CM | POA: Diagnosis not present

## 2017-12-17 DIAGNOSIS — M531 Cervicobrachial syndrome: Secondary | ICD-10-CM | POA: Diagnosis not present

## 2017-12-21 DIAGNOSIS — M531 Cervicobrachial syndrome: Secondary | ICD-10-CM | POA: Diagnosis not present

## 2017-12-21 DIAGNOSIS — M50223 Other cervical disc displacement at C6-C7 level: Secondary | ICD-10-CM | POA: Diagnosis not present

## 2017-12-21 DIAGNOSIS — M9901 Segmental and somatic dysfunction of cervical region: Secondary | ICD-10-CM | POA: Diagnosis not present

## 2017-12-23 DIAGNOSIS — M531 Cervicobrachial syndrome: Secondary | ICD-10-CM | POA: Diagnosis not present

## 2017-12-23 DIAGNOSIS — M9901 Segmental and somatic dysfunction of cervical region: Secondary | ICD-10-CM | POA: Diagnosis not present

## 2017-12-23 DIAGNOSIS — M50223 Other cervical disc displacement at C6-C7 level: Secondary | ICD-10-CM | POA: Diagnosis not present

## 2017-12-25 DIAGNOSIS — M9901 Segmental and somatic dysfunction of cervical region: Secondary | ICD-10-CM | POA: Diagnosis not present

## 2017-12-25 DIAGNOSIS — M531 Cervicobrachial syndrome: Secondary | ICD-10-CM | POA: Diagnosis not present

## 2017-12-25 DIAGNOSIS — M50223 Other cervical disc displacement at C6-C7 level: Secondary | ICD-10-CM | POA: Diagnosis not present

## 2017-12-28 DIAGNOSIS — M531 Cervicobrachial syndrome: Secondary | ICD-10-CM | POA: Diagnosis not present

## 2017-12-28 DIAGNOSIS — M50223 Other cervical disc displacement at C6-C7 level: Secondary | ICD-10-CM | POA: Diagnosis not present

## 2017-12-28 DIAGNOSIS — M9901 Segmental and somatic dysfunction of cervical region: Secondary | ICD-10-CM | POA: Diagnosis not present

## 2017-12-30 DIAGNOSIS — M50223 Other cervical disc displacement at C6-C7 level: Secondary | ICD-10-CM | POA: Diagnosis not present

## 2017-12-30 DIAGNOSIS — M9901 Segmental and somatic dysfunction of cervical region: Secondary | ICD-10-CM | POA: Diagnosis not present

## 2017-12-30 DIAGNOSIS — M531 Cervicobrachial syndrome: Secondary | ICD-10-CM | POA: Diagnosis not present

## 2018-01-01 DIAGNOSIS — M50223 Other cervical disc displacement at C6-C7 level: Secondary | ICD-10-CM | POA: Diagnosis not present

## 2018-01-01 DIAGNOSIS — M531 Cervicobrachial syndrome: Secondary | ICD-10-CM | POA: Diagnosis not present

## 2018-01-01 DIAGNOSIS — M9901 Segmental and somatic dysfunction of cervical region: Secondary | ICD-10-CM | POA: Diagnosis not present

## 2018-01-15 DIAGNOSIS — G4733 Obstructive sleep apnea (adult) (pediatric): Secondary | ICD-10-CM | POA: Diagnosis not present

## 2018-01-19 ENCOUNTER — Encounter: Payer: Self-pay | Admitting: Internal Medicine

## 2018-01-25 ENCOUNTER — Encounter: Payer: Self-pay | Admitting: Family Medicine

## 2018-01-26 ENCOUNTER — Ambulatory Visit (INDEPENDENT_AMBULATORY_CARE_PROVIDER_SITE_OTHER)
Admission: RE | Admit: 2018-01-26 | Discharge: 2018-01-26 | Disposition: A | Discharge status: Home / Self Care | Payer: 59 | Source: Ambulatory Visit | Attending: Internal Medicine | Admitting: Internal Medicine

## 2018-01-26 ENCOUNTER — Ambulatory Visit: Payer: 59 | Admitting: Internal Medicine

## 2018-01-26 ENCOUNTER — Encounter: Payer: Self-pay | Admitting: Internal Medicine

## 2018-01-26 VITALS — BP 138/78 | HR 50 | Temp 97.6°F | Wt 234.0 lb

## 2018-01-26 DIAGNOSIS — M546 Pain in thoracic spine: Secondary | ICD-10-CM

## 2018-01-26 DIAGNOSIS — M5032 Other cervical disc degeneration, mid-cervical region, unspecified level: Secondary | ICD-10-CM | POA: Diagnosis not present

## 2018-01-26 DIAGNOSIS — M5134 Other intervertebral disc degeneration, thoracic region: Secondary | ICD-10-CM | POA: Diagnosis not present

## 2018-01-26 DIAGNOSIS — M79602 Pain in left arm: Secondary | ICD-10-CM

## 2018-01-26 DIAGNOSIS — R202 Paresthesia of skin: Secondary | ICD-10-CM

## 2018-01-26 DIAGNOSIS — M79601 Pain in right arm: Secondary | ICD-10-CM

## 2018-01-26 DIAGNOSIS — M436 Torticollis: Secondary | ICD-10-CM

## 2018-01-26 MED ORDER — METHOCARBAMOL 500 MG PO TABS: 500.0000 mg | ORAL_TABLET | Freq: Three times a day (TID) | ORAL | 0 refills | Status: DC | PRN

## 2018-01-26 MED ORDER — PREDNISONE 10 MG PO TABS: ORAL_TABLET | ORAL | 0 refills | Status: DC

## 2018-01-26 NOTE — Progress Notes (Signed)
Subjective:    Patient ID: [name], male    DOB: March 07, 1956, 62 y.o.   MRN: [id number]  HPI  Pt presents to the clinic today with c/o right sided back pain. He reports this started 4 days ago, when he was lifting something above his head. He describes the pains as sharp. He reports the pain radiates down his right arm past his elbow. He does have some numbness and tingling in the right arm, but denies weakness. He reports some neck stiffness but no neck pain. He has tried Ibuprofen and Ice with minimal relief. He recalls having a similar episode 2-3 months ago, but went to the chiropractor and got relief after an adjustment. He denies any injury to the area.  Review of Systems      Past Medical History:  Diagnosis Date  . Coronary atherosclerosis of unspecified type of vessel, native or graft 2002   s/p 3 vessel PCI  . Diabetes type 2, controlled ([place])    did not return calls to schedule DSME  . Esophageal reflux   . Essential hypertension 10/20/2016  . Ex-smoker    quit 2002  . HLD (hyperlipidemia)   . OSA (obstructive sleep apnea)     CPAP ([place])  . Umbilical hernia     Current Outpatient Medications  Medication Sig Dispense Refill  . aspirin EC 81 MG tablet Take 1 tablet (81 mg total) by mouth daily. 90 tablet 3  . Blood Glucose Monitoring Suppl (ONE TOUCH ULTRA SYSTEM KIT) w/Device KIT 1 kit by Does not apply route once. 1 each 0  . esomeprazole (NEXIUM) 40 MG capsule Take 1 capsule (40 mg total) by mouth daily at 12 noon. Pt prefers brand name 90 capsule 3  . glucose blood (ONE TOUCH ULTRA TEST) test strip Use as instructedCHECK TWICE DAILY AND AS NEEDED. E11.65 100 each 4  . LIPITOR 20 MG tablet TAKE 1 TABLET (20 MG TOTAL) BY MOUTH DAILY. 90 tablet 3  . metFORMIN (GLUCOPHAGE-XR) 500 MG 24 hr tablet Take 2 tablets (1,000 mg total) by mouth daily with breakfast. 180 tablet 3  . metoprolol succinate (TOPROL-XL) 25 MG 24 hr tablet Take 0.5 tablets (12.5 mg total) by  mouth daily. 45 tablet 3  . nitroGLYCERIN (NITROSTAT) 0.4 MG SL tablet Place 1 tablet (0.4 mg total) under the tongue every 5 (five) minutes as needed for chest pain (chest pain MAX 3 doses). 25 tablet 11  . ONETOUCH DELICA LANCETS 83M MISC 1 each by Other route 2 (two) times daily as needed. 100 each 4  . sildenafil (VIAGRA) 100 MG tablet Take 0.5 tablets (50 mg total) by mouth daily as needed for erectile dysfunction. 30 tablet 3   No current facility-administered medications for this visit.     No Known Allergies  Family History  Problem Relation Age of Onset  . Lung cancer Father 31       smoker  . Hypertension Mother   . Diabetes Neg Hx   . Coronary artery disease Neg Hx   . Stroke Neg Hx   . Colon cancer Neg Hx   . Esophageal cancer Neg Hx   . Rectal cancer Neg Hx   . Stomach cancer Neg Hx     Social History   Socioeconomic History  . Marital status: Married    Spouse name: Not on file  . Number of children: Not on file  . Years of education: Not on file  . Highest education level: Not  on file  Occupational History  . Occupation: Civil engineer, contracting: [employer]  . Financial resource strain: Not on file  . Food insecurity:    Worry: Not on file    Inability: Not on file  . Transportation needs:    Medical: Not on file    Non-medical: Not on file  Tobacco Use  . Smoking status: Former Smoker    Packs/day: 1.00    Years: 15.00    Pack years: 15.00    Types: Cigarettes    Last attempt to quit: 09/15/2000    Years since quitting: 17.3  . Smokeless tobacco: Never Used  Substance and Sexual Activity  . Alcohol use: No  . Drug use: No  . Sexual activity: Not on file  Lifestyle  . Physical activity:    Days per week: Not on file    Minutes per session: Not on file  . Stress: Not on file  Relationships  . Social connections:    Talks on phone: Not on file    Gets together: Not on file    Attends religious service: Not on  file    Active member of club or organization: Not on file    Attends meetings of clubs or organizations: Not on file    Relationship status: Not on file  . Intimate partner violence:    Fear of current or ex partner: Not on file    Emotionally abused: Not on file    Physically abused: Not on file    Forced sexual activity: Not on file  Other Topics Concern  . Not on file  Social History Narrative   Caffeine: 1 pepsi/day   Lives with wife, 1 dog, 2 cats.  Grown daughter.   Occupation: Dealer   Edu: HS   Activity: work 12 hour days, gardens and fishes   Diet: good water daily, fruits/vegetables daily     Constitutional: Denies fever, malaise, fatigue, headache or abrupt weight changes.  Musculoskeletal: Pt reports back, right arm pain, neck stiffness. Denies decrease in range of motion, difficulty with gait, muscle pain or joint swelling.  Skin: Denies redness, rashes, lesions or ulcercations.  Neurological: Pt reports numbness and tingling of RUE, Denies dizziness, difficulty with memory, difficulty with speech or problems with balance and coordination.    No other specific complaints in a complete review of systems (except as listed in HPI above).  Objective:   Physical Exam   BP 138/78   Pulse (!) 50   Temp 97.6 F (36.4 C) (Oral)   Wt 234 lb (106.1 kg)   SpO2 98%   BMI 33.58 kg/m  Wt Readings from Last 3 Encounters:  01/26/18 234 lb (106.1 kg)  12/07/17 230 lb 3.2 oz (104.4 kg)  10/19/17 231 lb 6.4 oz (105 kg)    General: Appears his stated age, well developed, well nourished in NAD. Cardiovascular: Radial pulse 2+ bilaterally. Musculoskeletal: Pain with flexion of the cervical spine. Decreased extension. Normal rotation and lateral bending. Pain with palpation over the thoracic spine. Normal internal and external rotation, abduction and adduction of the shoulders. No pain with palpation of the shoulder joints. Negative drop can test. Strength 4/5 RUE, 5/5  RUE. Neurological: Alert and oriented. Sensation intact to BUE.  BMET    Component Value Date/Time   NA 137 08/27/2017 0929   NA 137 10/20/2016 0942   K 4.4 08/27/2017 0929   CL 101 08/27/2017 0929   CO2 32 08/27/2017  0929   GLUCOSE 178 (H) 08/27/2017 0929   BUN 11 08/27/2017 0929   BUN 13 10/20/2016 0942   CREATININE 0.91 08/27/2017 0929   CREATININE 0.91 07/13/2015 1208   CALCIUM 9.0 08/27/2017 0929   GFRNONAA 84 10/20/2016 0942   GFRAA 97 10/20/2016 0942    Lipid Panel     Component Value Date/Time   CHOL 105 08/27/2017 0929   CHOL 101 10/20/2016 0942   TRIG 111.0 08/27/2017 0929   HDL 36.00 (L) 08/27/2017 0929   HDL 36 (L) 10/20/2016 0942   CHOLHDL 3 08/27/2017 0929   VLDL 22.2 08/27/2017 0929   LDLCALC 47 08/27/2017 0929   LDLCALC 48 10/20/2016 0942    CBC    Component Value Date/Time   WBC 9.0 08/27/2017 0929   RBC 4.78 08/27/2017 0929   HGB 14.8 08/27/2017 0929   HCT 43.4 08/27/2017 0929   PLT 209.0 08/27/2017 0929   MCV 90.8 08/27/2017 0929   MCH 30.4 11/06/2015 1727   MCHC 34.1 08/27/2017 0929   RDW 12.9 08/27/2017 0929   LYMPHSABS 2.2 08/27/2017 0929   MONOABS 0.7 08/27/2017 0929   EOSABS 0.1 08/27/2017 0929   BASOSABS 0.0 08/27/2017 0929    Hgb A1C Lab Results  Component Value Date   HGBA1C 8.5 (H) 08/27/2017           Assessment & Plan:   Thoracic Back Pain:  Likely muscular Xray thoracic spine today per patient request eRx for Robaxin 500 mg TID prn- sedation caution given Heat may be helpful Stretching exercises given  Paresthesia of BUE, Neck Stiffness:  Xray cervical spine today eRx for Pred Taper x 9 days- no NSAID's while taking Prednisone eRx for Robaxin 500 mg TID prn- sedation caution given Heat may be helpful Stretching exercises given  Will follow up after xray, return precautions discussed Webb Silversmith, NP

## 2018-01-26 NOTE — Patient Instructions (Signed)

## 2018-02-15 DIAGNOSIS — G4733 Obstructive sleep apnea (adult) (pediatric): Secondary | ICD-10-CM | POA: Diagnosis not present

## 2018-03-04 ENCOUNTER — Encounter: Payer: Self-pay | Admitting: Internal Medicine

## 2018-03-05 ENCOUNTER — Telehealth: Payer: Self-pay | Admitting: Internal Medicine

## 2018-03-05 ENCOUNTER — Ambulatory Visit (INDEPENDENT_AMBULATORY_CARE_PROVIDER_SITE_OTHER)
Admission: RE | Admit: 2018-03-05 | Discharge: 2018-03-05 | Disposition: A | Discharge status: Home / Self Care | Payer: 59 | Source: Ambulatory Visit | Attending: Internal Medicine | Admitting: Internal Medicine

## 2018-03-05 ENCOUNTER — Ambulatory Visit: Payer: 59 | Admitting: Internal Medicine

## 2018-03-05 ENCOUNTER — Encounter: Payer: Self-pay | Admitting: Internal Medicine

## 2018-03-05 VITALS — BP 130/74 | HR 67 | Ht 70.0 in | Wt 232.4 lb

## 2018-03-05 DIAGNOSIS — R0600 Dyspnea, unspecified: Secondary | ICD-10-CM

## 2018-03-05 DIAGNOSIS — R05 Cough: Secondary | ICD-10-CM

## 2018-03-05 DIAGNOSIS — R059 Cough, unspecified: Secondary | ICD-10-CM

## 2018-03-05 DIAGNOSIS — Z87891 Personal history of nicotine dependence: Secondary | ICD-10-CM

## 2018-03-05 DIAGNOSIS — R0609 Other forms of dyspnea: Secondary | ICD-10-CM | POA: Diagnosis not present

## 2018-03-05 DIAGNOSIS — G4733 Obstructive sleep apnea (adult) (pediatric): Secondary | ICD-10-CM | POA: Diagnosis not present

## 2018-03-05 NOTE — Progress Notes (Signed)
HPI male former smoker followed for OSA, complicated by CAD/ 3vPCI, DM2, GERD, HBP, hyperlipidemia NPSG 11/30/07 - AHI 70/ hr weight 260 lbs Office Spirometry 08/11/17-mild restriction of exhaled volume-FVC 3.40/71%, FEV1 2.79/77%, ratio 0.82, FEF 25-75% 3.52/118% HST 01/30/17-AHI 72.3/hour, desaturation to 76%, body weight 230 pounds --------------------------------------------------------------------------------------------------- 12/07/17-62 year old male former smoker followed for OSA, complicated by CAD/ 3vPCI, DM2, GERD, HBP, hyperlipidemia HST 01/30/17-AHI 72.3/hour, desaturation to 76%, body weight 230 pounds ----OSA; Pt here to review HST results. We discussed his sleep study and treatment options.  He is motivated to try CPAP again to make it work.  Problem before was mainly with mask fit.  03/05/2018- 62 year old male former smoker followed for OSA, complicated by CAD/ 3vPCI, DM2, GERD, HBP, hyperlipidemia CPAP auto 5-20/ [facility] -----OSA: DME [facility]. Pt states he is doing the best he can to wear CPAP nightly; has had life moments where he was not able to wear it. DL attached. Pt enjoys the auto settings.  Download 77% compliance AHI 0.2/hour Pressure and leak are okay. Discussed compliance goals and barriers.  Smoked for 20 years- occasionally aware of some DOE with little cough and no acute events. Agreed to C  ROS-see HPI + = positive Constitutional:    weight loss, night sweats, fevers, chills, fatigue, lassitude. HEENT:    headaches, difficulty swallowing, tooth/dental problems, sore throat,       sneezing, itching, ear ache, + nasal congestion, post nasal drip, snoring CV:    chest pain, orthopnea, PND, swelling in lower extremities, anasarca,                                                          dizziness, palpitations Resp:   shortness of breath with exertion or at rest.                productive cough,   non-productive cough, coughing up of blood.              change in color  of mucus.  wheezing.   Skin:    rash or lesions. GI:  No-   heartburn, indigestion, abdominal pain, nausea, vomiting, diarrhea,                 change in bowel habits, loss of appetite GU: dysuria, change in color of urine, no urgency or frequency.   flank pain. MS:   + Joint pain, stiffness, decreased range of motion, back pain. Neuro-     nothing unusual Psych:  change in mood or affect.  depression or anxiety.   memory loss.  OBJ- Physical Exam General- Alert, Oriented, Affect-appropriate, Distress- none acute Skin- rash-none, lesions- none, excoriation- none Lymphadenopathy- none Head- atraumatic, + small mandible            Eyes- Gross vision intact, PERRLA, conjunctivae and secretions clear            Ears- Hearing, canals-normal            Nose-+ turbinate edema +-Septal dev, mucus, polyps, erosion, perforation             Throat- Mallampati III , mucosa clear , drainage- none, tonsils- atrophic Neck- flexible , trachea midline, no stridor , thyroid nl, carotid no bruit Chest - symmetrical excursion , unlabored  Heart/CV- RRR , no murmur , no gallop  , no rub, nl s1 s2                           - JVD- none , edema- none, stasis changes- none, varices- none           Lung- clear to P&A, wheeze- none, cough- none , dullness-none, rub- none           Chest wall-  Abd-  Br/ Gen/ Rectal- Not done, not indicated Extrem- cyanosis- none, clubbing, none, atrophy- none, strength- nl Neuro- grossly intact to observation

## 2018-03-05 NOTE — Patient Instructions (Signed)
Order- DME [facility]- please continue CPAP auto 5-20, mask of choice, humidifier, supplies, Airview  Order- CXR   Cough, former smoker  Please call if we can help

## 2018-03-05 NOTE — Telephone Encounter (Signed)
Called and spoke with pt letting him know the results of his cxr.  Pt expressed understanding. Nothing further needed.

## 2018-03-08 NOTE — Assessment & Plan Note (Signed)
Former smoker. No significant concerns but will update CXR.

## 2018-03-08 NOTE — Assessment & Plan Note (Signed)
Adequate compliance but he can do better.  Plan- work on compliance

## 2018-03-09 ENCOUNTER — Ambulatory Visit: Payer: 59 | Admitting: Internal Medicine

## 2018-03-11 DIAGNOSIS — G4733 Obstructive sleep apnea (adult) (pediatric): Secondary | ICD-10-CM | POA: Diagnosis not present

## 2018-03-17 DIAGNOSIS — G4733 Obstructive sleep apnea (adult) (pediatric): Secondary | ICD-10-CM | POA: Diagnosis not present

## 2018-03-19 DIAGNOSIS — G4733 Obstructive sleep apnea (adult) (pediatric): Secondary | ICD-10-CM | POA: Diagnosis not present

## 2018-04-17 DIAGNOSIS — G4733 Obstructive sleep apnea (adult) (pediatric): Secondary | ICD-10-CM | POA: Diagnosis not present

## 2018-05-18 DIAGNOSIS — G4733 Obstructive sleep apnea (adult) (pediatric): Secondary | ICD-10-CM | POA: Diagnosis not present

## 2018-05-29 DIAGNOSIS — M5431 Sciatica, right side: Secondary | ICD-10-CM | POA: Diagnosis not present

## 2018-06-14 DIAGNOSIS — M25571 Pain in right ankle and joints of right foot: Secondary | ICD-10-CM | POA: Diagnosis not present

## 2018-06-17 DIAGNOSIS — G4733 Obstructive sleep apnea (adult) (pediatric): Secondary | ICD-10-CM | POA: Diagnosis not present

## 2018-07-18 DIAGNOSIS — G4733 Obstructive sleep apnea (adult) (pediatric): Secondary | ICD-10-CM | POA: Diagnosis not present

## 2018-08-05 ENCOUNTER — Other Ambulatory Visit: Payer: Self-pay | Admitting: Family Medicine

## 2018-08-17 DIAGNOSIS — G4733 Obstructive sleep apnea (adult) (pediatric): Secondary | ICD-10-CM | POA: Diagnosis not present

## 2018-08-30 ENCOUNTER — Ambulatory Visit: Payer: 59 | Admitting: Internal Medicine

## 2018-09-17 DIAGNOSIS — G4733 Obstructive sleep apnea (adult) (pediatric): Secondary | ICD-10-CM | POA: Diagnosis not present

## 2018-09-20 ENCOUNTER — Other Ambulatory Visit: Payer: Self-pay | Admitting: Family Medicine

## 2018-10-05 ENCOUNTER — Other Ambulatory Visit: Payer: Self-pay | Admitting: Internal Medicine

## 2018-10-05 DIAGNOSIS — G4733 Obstructive sleep apnea (adult) (pediatric): Secondary | ICD-10-CM | POA: Diagnosis not present

## 2018-10-07 ENCOUNTER — Other Ambulatory Visit: Payer: Self-pay | Admitting: Internal Medicine

## 2018-10-07 ENCOUNTER — Telehealth: Payer: Self-pay

## 2018-10-07 MED ORDER — ATORVASTATIN CALCIUM 20 MG PO TABS: 20.0000 mg | ORAL_TABLET | Freq: Every day | ORAL | 0 refills | Status: DC

## 2018-10-07 NOTE — Telephone Encounter (Addendum)
Spoke with patient.  Made him aware that Dr. Saunders Revel was okay with him trying generic atorvastatin at the same dose which is 20MG  Made him aware that if he had any problems while taking the medication he needs to call us and let us know.   Patient also stated that he would like to come to the [place] office due to it being closer for him.   I spoke with Hulan Amato, one of our schedulers and she will reach out and made that appointment for him.

## 2018-10-07 NOTE — Addendum Note (Signed)
Addended by: Janan Ridge on: 10/07/2018 01:23 PM   Modules accepted: Orders

## 2018-10-07 NOTE — Telephone Encounter (Signed)
Patient is requiring Prior Auth for Brand Lipitor.  Called and spoke with patient asking if he had tried any other alternatives.  Patient stated he has always been on brand Lipitor since he had his stent placed.  Patient stated he would be willing to switch to generic if need be.   Per Prior Auth  "Formulary Alternatives should be prescribed first unless the patient is unable to use or receive treatment with the alternative. Required Formulary Alternatives: 3 in a class with 3 or more alternatives, atorvastatin, ezetimibe-simvastatin, fluvastatin, lovastatin, pravastatin, rosuvastatin, simvastatin"

## 2018-10-07 NOTE — Telephone Encounter (Signed)
Prior Auth started through [place]: [id number] PA Case ID: [id number] Rx #: [id number]  Awaiting response

## 2018-10-07 NOTE — Telephone Encounter (Signed)
I think it fine for him to try generic atorvastatin at the same dose. If he has any problems with the medication, he should let us know.  Gerald Stabs

## 2018-10-07 NOTE — Telephone Encounter (Signed)
Nexium Approved today  Your PA request has been approved.  Additional information will be provided in the approval communication.

## 2018-10-28 ENCOUNTER — Telehealth: Payer: Self-pay | Admitting: Family Medicine

## 2018-10-28 NOTE — Telephone Encounter (Signed)
E-scribed refill.  Pls schedule annual CPE. 

## 2018-10-29 NOTE — Telephone Encounter (Signed)
Noted  

## 2018-10-29 NOTE — Telephone Encounter (Signed)
4/27 labs 5/1 cpx Pt aware

## 2018-11-09 ENCOUNTER — Encounter: Payer: Self-pay | Admitting: Internal Medicine

## 2018-11-11 ENCOUNTER — Ambulatory Visit: Payer: 59 | Admitting: Internal Medicine

## 2018-11-11 ENCOUNTER — Encounter: Payer: Self-pay | Admitting: Internal Medicine

## 2018-11-11 VITALS — BP 128/72 | HR 57 | Ht 69.0 in | Wt 238.6 lb

## 2018-11-11 DIAGNOSIS — G4733 Obstructive sleep apnea (adult) (pediatric): Secondary | ICD-10-CM | POA: Diagnosis not present

## 2018-11-11 DIAGNOSIS — E669 Obesity, unspecified: Secondary | ICD-10-CM | POA: Diagnosis not present

## 2018-11-11 NOTE — Progress Notes (Signed)
HPI male former smoker followed for OSA, complicated by CAD/ 3vPCI, DM2, GERD, HBP, hyperlipidemia NPSG 11/30/07 - AHI 70/ hr weight 260 lbs Office Spirometry 08/11/17-mild restriction of exhaled volume-FVC 3.40/71%, FEV1 2.79/77%, ratio 0.82, FEF 25-75% 3.52/118% HST 01/30/17-AHI 72.3/hour, desaturation to 76%, body weight 230 pounds ---------------------------------------------------------------------------------------------------  03/05/2018- 63 year old male former smoker followed for OSA, complicated by CAD/ 3vPCI, DM2, GERD, HBP, hyperlipidemia CPAP auto 5-20/ [facility] -----[facility]: DME [facility]. Pt states he is doing the best he can to wear CPAP nightly; has had life moments where he was not able to wear it. DL attached. Pt enjoys the auto settings.  Download 77% compliance AHI 0.2/hour Pressure and leak are okay. Discussed compliance goals and barriers.  Smoked for 20 years- occasionally aware of some DOE with little cough and no acute events  11/11/2018-  63 year old male former smoker followed for OSA, complicated by CAD/ 3vPCI, DM2, GERD, HBP, hyperlipidemia CPAP auto 5-20/ [facility] Download 83% compliance AHI 0.3/hour Body weight today 238 pounds -----Pt states he has been doing well since last visit and denies any concerns. He denies any problems with CPAP.  Admits he misses once in a while.  Occasional mild mask leak.  He checks his own BP and oximetry at home and denies other active medical concerns. CXR  03/05/18 No active cardiopulmonary disease.  ROS-see HPI + = positive Constitutional:    weight loss, night sweats, fevers, chills, fatigue, lassitude. HEENT:    headaches, difficulty swallowing, tooth/dental problems, sore throat,       sneezing, itching, ear ache, + nasal congestion, post nasal drip, snoring CV:    chest pain, orthopnea, PND, swelling in lower extremities, anasarca,                                                          dizziness, palpitations Resp:   shortness of  breath with exertion or at rest.                productive cough,   non-productive cough, coughing up of blood.              change in color of mucus.  wheezing.   Skin:    rash or lesions. GI:  No-   heartburn, indigestion, abdominal pain, nausea, vomiting, diarrhea,                 change in bowel habits, loss of appetite GU: dysuria, change in color of urine, no urgency or frequency.   flank pain. MS:   + Joint pain, stiffness, decreased range of motion, back pain. Neuro-     nothing unusual Psych:  change in mood or affect.  depression or anxiety.   memory loss.  OBJ- Physical Exam General- Alert, Oriented, Affect-appropriate, Distress- none acute Skin- rash-none, lesions- none, excoriation- none Lymphadenopathy- none Head- atraumatic, + small mandible            Eyes- Gross vision intact, PERRLA, conjunctivae and secretions clear            Ears- Hearing, canals-normal            Nose-+ turbinate edema +-Septal dev, mucus, polyps, erosion, perforation             Throat- Mallampati III , mucosa clear , drainage- none, tonsils- atrophic Neck- flexible ,  trachea midline, no stridor , thyroid nl, carotid no bruit Chest - symmetrical excursion , unlabored           Heart/CV- RRR , no murmur , no gallop  , no rub, nl s1 s2                           - JVD- none , edema- none, stasis changes- none, varices- none           Lung- clear to P&A, wheeze- none, cough- none , dullness-none, rub- none           Chest wall-  Abd-  Br/ Gen/ Rectal- Not done, not indicated Extrem- cyanosis- none, clubbing, none, atrophy- none, strength- nl Neuro- grossly intact to observation

## 2018-11-11 NOTE — Patient Instructions (Signed)
We can continue CPAP auto 5-20, mask of choice, humidifier, supplies, Airview/ card  Please call if we can heip

## 2018-11-11 NOTE — Assessment & Plan Note (Signed)
He continues to benefit from CPAP with improved sleep.  We discussed comfort issues. Plan-continue CPAP auto 5-20

## 2018-11-11 NOTE — Assessment & Plan Note (Signed)
Reviewed.  Weight loss would help him if he can change his lifestyle enough to accomplish it.  Bariatric referral may be an option.

## 2018-12-22 ENCOUNTER — Telehealth: Payer: Self-pay

## 2018-12-22 NOTE — Telephone Encounter (Signed)
Call attempted to switch 01/06/2019 face to face visit with Dr. Saunders Revel to a telephone or video visit due to current clinic policies related to [place] [facility] precautions. Left voicemail message requesting for patient to call back to discuss.

## 2018-12-30 ENCOUNTER — Other Ambulatory Visit: Payer: Self-pay | Admitting: Internal Medicine

## 2019-01-06 ENCOUNTER — Ambulatory Visit: Payer: 59 | Admitting: Internal Medicine

## 2019-01-07 DIAGNOSIS — G4733 Obstructive sleep apnea (adult) (pediatric): Secondary | ICD-10-CM | POA: Diagnosis not present

## 2019-01-10 ENCOUNTER — Other Ambulatory Visit: Payer: 59

## 2019-01-14 ENCOUNTER — Encounter: Payer: 59 | Admitting: Family Medicine

## 2019-01-20 ENCOUNTER — Other Ambulatory Visit: Payer: Self-pay | Admitting: Family Medicine

## 2019-01-21 NOTE — Telephone Encounter (Signed)
Pt was already rescheduled for 01/04/19 to 04/14/19 for labs and 04/19/19 for CPE.

## 2019-01-21 NOTE — Telephone Encounter (Signed)
E-scribed metformin refill.  Pls schedule virtual f/u with labs.

## 2019-01-26 ENCOUNTER — Other Ambulatory Visit: Payer: Self-pay | Admitting: Internal Medicine

## 2019-01-26 NOTE — Telephone Encounter (Signed)
Ok to refill- # 25 w/ no refills.   Patient overdue for follow up, but refused his February recall due to COVID-19. He has a recall in for August. He will need to follow up prior to any further refills on NTG.

## 2019-01-26 NOTE — Telephone Encounter (Signed)
Please review for refill.  

## 2019-02-05 ENCOUNTER — Other Ambulatory Visit: Payer: Self-pay | Admitting: Internal Medicine

## 2019-04-14 ENCOUNTER — Other Ambulatory Visit (INDEPENDENT_AMBULATORY_CARE_PROVIDER_SITE_OTHER): Payer: 59

## 2019-04-14 ENCOUNTER — Other Ambulatory Visit: Payer: Self-pay

## 2019-04-14 ENCOUNTER — Other Ambulatory Visit: Payer: Self-pay | Admitting: Family Medicine

## 2019-04-14 DIAGNOSIS — I10 Essential (primary) hypertension: Secondary | ICD-10-CM

## 2019-04-14 DIAGNOSIS — E118 Type 2 diabetes mellitus with unspecified complications: Secondary | ICD-10-CM

## 2019-04-14 DIAGNOSIS — Z125 Encounter for screening for malignant neoplasm of prostate: Secondary | ICD-10-CM

## 2019-04-14 DIAGNOSIS — E785 Hyperlipidemia, unspecified: Secondary | ICD-10-CM

## 2019-04-14 LAB — LIPID PANEL
Cholesterol: 115 mg/dL (ref 0–200)
HDL: 32.9 mg/dL — ABNORMAL LOW (ref >39.00)
LDL Cholesterol: 48 mg/dL (ref 0–99)
NonHDL: 82.19
Total CHOL/HDL Ratio: 3
Triglycerides: 170 mg/dL — ABNORMAL HIGH (ref 0.0–149.0)
VLDL: 34 mg/dL (ref 0.0–40.0)

## 2019-04-14 LAB — HEMOGLOBIN A1C: Hgb A1c MFr Bld: 9.4 % — ABNORMAL HIGH (ref 4.6–6.5)

## 2019-04-14 LAB — COMPREHENSIVE METABOLIC PANEL
ALT: 33 U/L (ref 0–53)
AST: 29 U/L (ref 0–37)
Albumin: 4.2 g/dL (ref 3.5–5.2)
Alkaline Phosphatase: 69 U/L (ref 39–117)
BUN: 12 mg/dL (ref 6–23)
CO2: 28 mEq/L (ref 19–32)
Calcium: 9.3 mg/dL (ref 8.4–10.5)
Chloride: 101 mEq/L (ref 96–112)
Creatinine, Ser: 0.98 mg/dL (ref 0.40–1.50)
GFR: 77.42 mL/min (ref >60.00)
Glucose, Bld: 225 mg/dL — ABNORMAL HIGH (ref 70–99)
Potassium: 4.3 mEq/L (ref 3.5–5.1)
Sodium: 138 mEq/L (ref 135–145)
Total Bilirubin: 0.6 mg/dL (ref 0.2–1.2)
Total Protein: 7 g/dL (ref 6.0–8.3)

## 2019-04-14 LAB — MICROALBUMIN / CREATININE URINE RATIO
Creatinine,U: 406.9 mg/dL
Microalb Creat Ratio: 0.5 mg/g (ref 0.0–30.0)
Microalb, Ur: 2.2 mg/dL — ABNORMAL HIGH (ref 0.0–1.9)

## 2019-04-14 LAB — PSA: PSA: 0.47 ng/mL (ref 0.10–4.00)

## 2019-04-15 ENCOUNTER — Other Ambulatory Visit: Payer: Self-pay | Admitting: Family Medicine

## 2019-04-19 ENCOUNTER — Encounter: Payer: Self-pay | Admitting: Family Medicine

## 2019-04-19 ENCOUNTER — Telehealth (INDEPENDENT_AMBULATORY_CARE_PROVIDER_SITE_OTHER): Payer: 59 | Admitting: Family Medicine

## 2019-04-19 VITALS — BP 120/67 | HR 65 | Ht 69.0 in | Wt 225.0 lb

## 2019-04-19 DIAGNOSIS — N529 Male erectile dysfunction, unspecified: Secondary | ICD-10-CM | POA: Diagnosis not present

## 2019-04-19 DIAGNOSIS — F439 Reaction to severe stress, unspecified: Secondary | ICD-10-CM

## 2019-04-19 DIAGNOSIS — D126 Benign neoplasm of colon, unspecified: Secondary | ICD-10-CM | POA: Diagnosis not present

## 2019-04-19 DIAGNOSIS — E785 Hyperlipidemia, unspecified: Secondary | ICD-10-CM

## 2019-04-19 DIAGNOSIS — Z0000 Encounter for general adult medical examination without abnormal findings: Secondary | ICD-10-CM

## 2019-04-19 DIAGNOSIS — E118 Type 2 diabetes mellitus with unspecified complications: Secondary | ICD-10-CM | POA: Diagnosis not present

## 2019-04-19 DIAGNOSIS — K219 Gastro-esophageal reflux disease without esophagitis: Secondary | ICD-10-CM

## 2019-04-19 DIAGNOSIS — G4733 Obstructive sleep apnea (adult) (pediatric): Secondary | ICD-10-CM

## 2019-04-19 DIAGNOSIS — I10 Essential (primary) hypertension: Secondary | ICD-10-CM

## 2019-04-19 MED ORDER — ESOMEPRAZOLE MAGNESIUM 40 MG PO CPDR: 40.0000 mg | DELAYED_RELEASE_CAPSULE | Freq: Every day | ORAL | 1 refills | Status: DC

## 2019-04-19 MED ORDER — NITROGLYCERIN 0.4 MG SL SUBL: 0.4000 mg | SUBLINGUAL_TABLET | SUBLINGUAL | 0 refills | Status: DC | PRN

## 2019-04-19 MED ORDER — SILDENAFIL CITRATE 100 MG PO TABS: 50.0000 mg | ORAL_TABLET | Freq: Every day | ORAL | 3 refills | Status: DC | PRN

## 2019-04-19 MED ORDER — METFORMIN HCL ER 500 MG PO TB24: 1000.0000 mg | ORAL_TABLET | Freq: Every day | ORAL | 3 refills | Status: DC

## 2019-04-19 MED ORDER — ONETOUCH DELICA LANCETS 33G MISC: 1.0000 | Freq: Two times a day (BID) | 3 refills | Status: DC | PRN

## 2019-04-19 MED ORDER — ATORVASTATIN CALCIUM 20 MG PO TABS: 20.0000 mg | ORAL_TABLET | Freq: Every day | ORAL | 3 refills | Status: DC

## 2019-04-19 MED ORDER — ONETOUCH ULTRA 2 W/DEVICE KIT: PACK | 0 refills | Status: DC

## 2019-04-19 MED ORDER — GLUCOSE BLOOD VI STRP: ORAL_STRIP | 3 refills | Status: DC

## 2019-04-19 MED ORDER — JARDIANCE 10 MG PO TABS: 10.0000 mg | ORAL_TABLET | Freq: Every day | ORAL | 6 refills | Status: DC

## 2019-04-19 MED ORDER — HYDROXYZINE HCL 25 MG PO TABS: 12.5000 mg | ORAL_TABLET | Freq: Two times a day (BID) | ORAL | 0 refills | Status: DC | PRN

## 2019-04-19 MED ORDER — METOPROLOL SUCCINATE ER 25 MG PO TB24: 12.5000 mg | ORAL_TABLET | Freq: Every day | ORAL | 3 refills | Status: DC

## 2019-04-19 NOTE — Assessment & Plan Note (Signed)
Preventative protocols reviewed and updated unless pt declined. Discussed healthy diet and lifestyle.  

## 2019-04-19 NOTE — Assessment & Plan Note (Signed)
Chronic, uncontrolled. Attributes to dietary indiscretions. Will start checking sugars more regularly. Discussed jardiance in setting of known CAD - will start 10mg  daily. Reviewed mechanism of action as well as need to monitor for UTI, yeast infections, groin cellulitis/infection. RTC 3 mo DM f/u visit.

## 2019-04-19 NOTE — Assessment & Plan Note (Signed)
Chronic, stable. Continue current regimen. 

## 2019-04-19 NOTE — Assessment & Plan Note (Signed)
Chronic on nexium - brand more effective.

## 2019-04-19 NOTE — Progress Notes (Signed)
Virtual visit completed through [facility]. Due to national recommendations of social distancing due to COVID-19, a virtual visit is felt to be most appropriate for this patient at this time. Reviewed limitations of a virtual visit.   Patient location: home Provider location: [place] at [facility], office If any vitals were documented, they were collected by patient at home unless specified below.    BP 120/67   Pulse 65   Ht 5' 9" (1.753 m)   Wt 225 lb (102.1 kg)   SpO2 94%   BMI 33.23 kg/m    CC: CPE Subjective:    Patient ID: [name], male    DOB: [birth year], 63 y.o.   MRN: [id number]  HPI: YAIR DUSZA is a 63 y.o. male presenting on 04/19/2019 for Annual Exam   Last seen 08/2017 for preop eval.  Noticing increasing stress/anxiety due to pandemic. Asks about medication for this.  Glucometer broke so hasn't been checking sugars. Requests refilled.  [facility] - followed by Dr Annamaria Boots   Preventative: COLONOSCOPY Date: 06/2012 15 polyps - adenomatous, mild diverticulosis, rpt 1 yr ([facility]) - overdue for rpt - # provided to call. Prostate cancer screening - previously say urology ([facility]). would like to continue screening at our office. Flu shot - declines Tetanus 2012  Pneumovax - 2017 Seat belt use discussed  Sunscreen use discussed, no changing moles on skin Ex-smoker quit 2002 Alcohol - occasional Dentist q6 mo Eye exam - due   Caffeine: 1 pepsi/day Lives with wife, 1 dog, 2 cats. Grown daughter. 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 Interim medical history since our last visit reviewed. Allergies and medications reviewed and updated. Outpatient Medications Prior to Visit  Medication Sig Dispense Refill  . aspirin EC 81 MG tablet Take 1 tablet  (81 mg total) by mouth daily. 90 tablet 3  . atorvastatin (LIPITOR) 20 MG tablet TAKE 1 TABLET BY MOUTH EVERY DAY 90 tablet 0  . metFORMIN (GLUCOPHAGE-XR) 500 MG 24 hr tablet TAKE 2 TABLETS BY MOUTH EVERY DAY WITH BREAKFAST 180 tablet 0  . metoprolol succinate (TOPROL-XL) 25 MG 24 hr tablet TAKE 1/2 TABLETS (12.5 MG TOTAL) BY MOUTH DAILY. 45 tablet 1  . NEXIUM 40 MG capsule TAKE 1 CAPSULE BY MOUTH DAILY AT 12 NOON. PT PREFERS BRAND NAME 90 capsule 1  . nitroGLYCERIN (NITROSTAT) 0.4 MG SL tablet PLACE 1 TABLET UNDER THE TONGUE EVERY 5 MINUTES AS NEEDED FOR CHEST PAIN (CHEST PAIN MAX 3 DOSES) 25 tablet 0  . sildenafil (VIAGRA) 100 MG tablet Take 0.5 tablets (50 mg total) by mouth daily as needed for erectile dysfunction. 30 tablet 3  . Blood Glucose Monitoring Suppl (ONE TOUCH ULTRA SYSTEM KIT) w/Device KIT 1 kit by Does not apply route once. (Patient not taking: Reported on 04/19/2019) 1 each 0  . glucose blood test strip USE AS INSTRUCTEDCHECK TWICE DAILY AND AS NEEDED. E11.65 (Patient not taking: Reported on 04/19/2019) 50 each 3  . ONETOUCH DELICA LANCETS 25E MISC 1 each by Other route 2 (two) times daily as needed. (Patient not taking: Reported on 04/19/2019) 100 each 4   No facility-administered medications prior to visit.      Per HPI unless specifically indicated in ROS section below Review of Systems  Constitutional: Negative for activity change, appetite change, chills,  fatigue, fever and unexpected weight change.  HENT: Negative for hearing loss.   Eyes: Negative for visual disturbance.  Respiratory: Negative for cough, chest tightness, shortness of breath and wheezing.   Cardiovascular: Negative for chest pain, palpitations and leg swelling.  Gastrointestinal: Positive for abdominal pain. Negative for abdominal distention, blood in stool, constipation, diarrhea, nausea and vomiting.  Genitourinary: Negative for difficulty urinating and hematuria.  Musculoskeletal: Negative for arthralgias,  myalgias and neck pain.  Skin: Negative for rash.  Neurological: Negative for dizziness, seizures, syncope and headaches.  Hematological: Negative for adenopathy. Does not bruise/bleed easily.  Psychiatric/Behavioral: Negative for dysphoric mood. The patient is nervous/anxious.    Objective:    BP 120/67   Pulse 65   Ht 5' 9" (1.753 m)   Wt 225 lb (102.1 kg)   SpO2 94%   BMI 33.23 kg/m   Wt Readings from Last 3 Encounters:  04/19/19 225 lb (102.1 kg)  11/11/18 238 lb 9.6 oz (108.2 kg)  03/05/18 232 lb 6.4 oz (105.4 kg)     Physical exam: Gen: alert, NAD, not ill appearing Pulm: speaks in complete sentences without increased work of breathing Psych: normal mood, normal thought content      Results for orders placed or performed in visit on 04/14/19  Microalbumin / creatinine urine ratio  Result Value Ref Range   Microalb, Ur 2.2 (H) 0.0 - 1.9 mg/dL   Creatinine,U 406.9 mg/dL   Microalb Creat Ratio 0.5 0.0 - 30.0 mg/g  PSA  Result Value Ref Range   PSA 0.47 0.10 - 4.00 ng/mL  Hemoglobin A1c  Result Value Ref Range   Hgb A1c MFr Bld 9.4 (H) 4.6 - 6.5 %  Comprehensive metabolic panel  Result Value Ref Range   Sodium 138 135 - 145 mEq/L   Potassium 4.3 3.5 - 5.1 mEq/L   Chloride 101 96 - 112 mEq/L   CO2 28 19 - 32 mEq/L   Glucose, Bld 225 (H) 70 - 99 mg/dL   BUN 12 6 - 23 mg/dL   Creatinine, Ser 0.98 0.40 - 1.50 mg/dL   Total Bilirubin 0.6 0.2 - 1.2 mg/dL   Alkaline Phosphatase 69 39 - 117 U/L   AST 29 0 - 37 U/L   ALT 33 0 - 53 U/L   Total Protein 7.0 6.0 - 8.3 g/dL   Albumin 4.2 3.5 - 5.2 g/dL   Calcium 9.3 8.4 - 10.5 mg/dL   GFR 77.42 >60.00 mL/min  Lipid panel  Result Value Ref Range   Cholesterol 115 0 - 200 mg/dL   Triglycerides 170.0 (H) 0.0 - 149.0 mg/dL   HDL 32.90 (L) >39.00 mg/dL   VLDL 34.0 0.0 - 40.0 mg/dL   LDL Cholesterol 48 0 - 99 mg/dL   Total CHOL/HDL Ratio 3    NonHDL 82.19    Assessment & Plan:  Pt aware cannot take viagra with SL  nitro Problem List Items Addressed This Visit    Stress    Reviewed healthy stress relieving strategies as well as relaxation techniques. Will trial hydroxyzine PRN, discussed sedation precautions.       Obstructive sleep apnea   Hyperlipidemia LDL goal <70    Chronic, stable on atorvastatin 44m daily - continue. LDL at goal <70 The ASCVD Risk score (Mikey BussingDC Jr., et al., 2013) failed to calculate for the following reasons:   The valid total cholesterol range is 130 to 320 mg/dL       Relevant Medications   atorvastatin (  LIPITOR) 20 MG tablet   metoprolol succinate (TOPROL-XL) 25 MG 24 hr tablet   nitroGLYCERIN (NITROSTAT) 0.4 MG SL tablet   sildenafil (VIAGRA) 100 MG tablet   Healthcare maintenance - Primary    Preventative protocols reviewed and updated unless pt declined. Discussed healthy diet and lifestyle.       Gastroesophageal reflux disease without esophagitis    Chronic on nexium - brand more effective.       Relevant Medications   esomeprazole (NEXIUM) 40 MG capsule   Essential hypertension    Chronic, stable. Continue current regimen.       Relevant Medications   atorvastatin (LIPITOR) 20 MG tablet   metoprolol succinate (TOPROL-XL) 25 MG 24 hr tablet   nitroGLYCERIN (NITROSTAT) 0.4 MG SL tablet   sildenafil (VIAGRA) 100 MG tablet   Erectile dysfunction    viagra refilled - pt aware cannot take with SL nitro.       Diabetes mellitus type 2, controlled, with complications (HCC)    Chronic, uncontrolled. Attributes to dietary indiscretions. Will start checking sugars more regularly. Discussed jardiance in setting of known CAD - will start 72m daily. Reviewed mechanism of action as well as need to monitor for UTI, yeast infections, groin cellulitis/infection. RTC 3 mo DM f/u visit.       Relevant Medications   atorvastatin (LIPITOR) 20 MG tablet   metFORMIN (GLUCOPHAGE-XR) 500 MG 24 hr tablet   empagliflozin (JARDIANCE) 10 MG TABS tablet   Colon polyps     Overdue for rpt colonoscopy. [facility] - he states he will call to schedule.           Meds ordered this encounter  Medications  . atorvastatin (LIPITOR) 20 MG tablet    Sig: Take 1 tablet (20 mg total) by mouth daily.    Dispense:  90 tablet    Refill:  3  . metFORMIN (GLUCOPHAGE-XR) 500 MG 24 hr tablet    Sig: Take 2 tablets (1,000 mg total) by mouth daily with breakfast.    Dispense:  180 tablet    Refill:  3  . metoprolol succinate (TOPROL-XL) 25 MG 24 hr tablet    Sig: Take 0.5 tablets (12.5 mg total) by mouth daily.    Dispense:  45 tablet    Refill:  3  . esomeprazole (NEXIUM) 40 MG capsule    Sig: Take 1 capsule (40 mg total) by mouth daily at 12 noon.    Dispense:  90 capsule    Refill:  1  . nitroGLYCERIN (NITROSTAT) 0.4 MG SL tablet    Sig: Place 1 tablet (0.4 mg total) under the tongue every 5 (five) minutes as needed for chest pain.    Dispense:  25 tablet    Refill:  0  . sildenafil (VIAGRA) 100 MG tablet    Sig: Take 0.5 tablets (50 mg total) by mouth daily as needed for erectile dysfunction.    Dispense:  30 tablet    Refill:  3  . glucose blood test strip    Sig: Use as instructed    Dispense:  100 each    Refill:  3  . OneTouch Delica Lancets 326SMISC    Sig: 1 each by Other route 2 (two) times daily as needed.    Dispense:  100 each    Refill:  3  . Blood Glucose Monitoring Suppl (ONE TOUCH ULTRA 2) w/Device KIT    Sig: Use as directed E11.65    Dispense:  1 kit    Refill:  0  . empagliflozin (JARDIANCE) 10 MG TABS tablet    Sig: Take 10 mg by mouth daily.    Dispense:  30 tablet    Refill:  6  . hydrOXYzine (ATARAX/VISTARIL) 25 MG tablet    Sig: Take 0.5-1 tablets (12.5-25 mg total) by mouth 2 (two) times daily as needed for anxiety.    Dispense:  30 tablet    Refill:  0   No orders of the defined types were placed in this encounter.   I discussed the assessment and treatment plan with the patient. The patient was provided an  opportunity to ask questions and all were answered. The patient agreed with the plan and demonstrated an understanding of the instructions. The patient was advised to call back or seek an in-person evaluation if the symptoms worsen or if the condition fails to improve as anticipated.  Follow up plan: No follow-ups on file.  Javier Gutierrez, MD   

## 2019-04-19 NOTE — Assessment & Plan Note (Signed)
viagra refilled - pt aware cannot take with SL nitro.

## 2019-04-19 NOTE — Assessment & Plan Note (Signed)
Chronic, stable on atorvastatin 20mg  daily - continue. LDL at goal <70 The ASCVD Risk score Mikey Bussing DC Jr., et al., 2013) failed to calculate for the following reasons:   The valid total cholesterol range is 130 to 320 mg/dL

## 2019-04-19 NOTE — Assessment & Plan Note (Signed)
Overdue for rpt colonoscopy. # provided to [facility] - he states he will call to schedule.

## 2019-04-19 NOTE — Assessment & Plan Note (Addendum)
Reviewed healthy stress relieving strategies as well as relaxation techniques. Will trial hydroxyzine PRN, discussed sedation precautions.

## 2019-04-28 ENCOUNTER — Other Ambulatory Visit: Payer: Self-pay | Admitting: Family Medicine

## 2019-08-15 ENCOUNTER — Ambulatory Visit: Payer: 59 | Admitting: Family Medicine

## 2019-09-23 ENCOUNTER — Ambulatory Visit: Payer: 59 | Admitting: Family Medicine

## 2019-09-23 ENCOUNTER — Other Ambulatory Visit: Payer: Self-pay

## 2019-09-23 ENCOUNTER — Encounter: Payer: Self-pay | Admitting: Family Medicine

## 2019-09-23 VITALS — BP 124/66 | HR 54 | Temp 97.6°F | Ht 69.0 in | Wt 223.4 lb

## 2019-09-23 DIAGNOSIS — I10 Essential (primary) hypertension: Secondary | ICD-10-CM | POA: Diagnosis not present

## 2019-09-23 DIAGNOSIS — E1165 Type 2 diabetes mellitus with hyperglycemia: Secondary | ICD-10-CM | POA: Diagnosis not present

## 2019-09-23 DIAGNOSIS — E1169 Type 2 diabetes mellitus with other specified complication: Secondary | ICD-10-CM | POA: Diagnosis not present

## 2019-09-23 DIAGNOSIS — E118 Type 2 diabetes mellitus with unspecified complications: Secondary | ICD-10-CM

## 2019-09-23 DIAGNOSIS — K219 Gastro-esophageal reflux disease without esophagitis: Secondary | ICD-10-CM

## 2019-09-23 DIAGNOSIS — Z23 Encounter for immunization: Secondary | ICD-10-CM | POA: Diagnosis not present

## 2019-09-23 DIAGNOSIS — E785 Hyperlipidemia, unspecified: Secondary | ICD-10-CM

## 2019-09-23 DIAGNOSIS — IMO0002 Reserved for concepts with insufficient information to code with codable children: Secondary | ICD-10-CM

## 2019-09-23 LAB — HEMOGLOBIN A1C: Hgb A1c MFr Bld: 7.3 % — ABNORMAL HIGH (ref 4.6–6.5)

## 2019-09-23 MED ORDER — GLUCOSE BLOOD VI STRP: ORAL_STRIP | 3 refills | Status: DC

## 2019-09-23 MED ORDER — ESOMEPRAZOLE MAGNESIUM 40 MG PO CPDR: 40.0000 mg | DELAYED_RELEASE_CAPSULE | Freq: Every day | ORAL | 1 refills | Status: DC

## 2019-09-23 MED ORDER — NEXIUM 40 MG PO CPDR: 40.0000 mg | DELAYED_RELEASE_CAPSULE | Freq: Every day | ORAL | 1 refills | Status: DC

## 2019-09-23 MED ORDER — ONETOUCH DELICA LANCETS 33G MISC: 1.0000 | Freq: Two times a day (BID) | 3 refills | Status: DC | PRN

## 2019-09-23 NOTE — Addendum Note (Signed)
Addended by: Ria Bush on: 09/23/2019 11:06 AM   Modules accepted: Level of Service

## 2019-09-23 NOTE — Assessment & Plan Note (Signed)
Chronic, stable on current regimen. Continue.  

## 2019-09-23 NOTE — Patient Instructions (Addendum)
Trial biotin hair/nail vitamin.  We will refer you for diabetes classes at [facility] today.  You are doing well today - if A1c staying high, we will increase jardiance to full dose.  Schedule eye exam at your convenience.  Schedule colonoscopy.   Diabetes Mellitus and Nutrition, Adult When you have diabetes (diabetes mellitus), it is very important to have healthy eating habits because your blood sugar (glucose) levels are greatly affected by what you eat and drink. Eating healthy foods in the appropriate amounts, at about the same times every day, can help you:  Control your blood glucose.  Lower your risk of heart disease.  Improve your blood pressure.  Reach or maintain a healthy weight. Every person with diabetes is different, and each person has different needs for a meal plan. Your health care provider may recommend that you work with a diet and nutrition specialist (dietitian) to make a meal plan that is best for you. Your meal plan may vary depending on factors such as:  The calories you need.  The medicines you take.  Your weight.  Your blood glucose, blood pressure, and cholesterol levels.  Your activity level.  Other health conditions you have, such as heart or kidney disease. How do carbohydrates affect me? Carbohydrates, also called carbs, affect your blood glucose level more than any other type of food. Eating carbs naturally raises the amount of glucose in your blood. Carb counting is a method for keeping track of how many carbs you eat. Counting carbs is important to keep your blood glucose at a healthy level, especially if you use insulin or take certain oral diabetes medicines. It is important to know how many carbs you can safely have in each meal. This is different for every person. Your dietitian can help you calculate how many carbs you should have at each meal and for each snack. Foods that contain carbs include:  Bread, cereal, rice, pasta, and  crackers.  Potatoes and corn.  Peas, beans, and lentils.  Milk and yogurt.  Fruit and juice.  Desserts, such as cakes, cookies, ice cream, and candy. How does alcohol affect me? Alcohol can cause a sudden decrease in blood glucose (hypoglycemia), especially if you use insulin or take certain oral diabetes medicines. Hypoglycemia can be a life-threatening condition. Symptoms of hypoglycemia (sleepiness, dizziness, and confusion) are similar to symptoms of having too much alcohol. If your health care provider says that alcohol is safe for you, follow these guidelines:  Limit alcohol intake to no more than 1 drink per day for nonpregnant women and 2 drinks per day for men. One drink equals 12 oz of beer, 5 oz of wine, or 1 oz of hard liquor.  Do not drink on an empty stomach.  Keep yourself hydrated with water, diet soda, or unsweetened iced tea.  Keep in mind that regular soda, juice, and other mixers may contain a lot of sugar and must be counted as carbs. What are tips for following this plan?  Reading food labels  Start by checking the serving size on the "Nutrition Facts" label of packaged foods and drinks. The amount of calories, carbs, fats, and other nutrients listed on the label is based on one serving of the item. Many items contain more than one serving per package.  Check the total grams (g) of carbs in one serving. You can calculate the number of servings of carbs in one serving by dividing the total carbs by 15. For example, if a food  has 30 g of total carbs, it would be equal to 2 servings of carbs.  Check the number of grams (g) of saturated and trans fats in one serving. Choose foods that have low or no amount of these fats.  Check the number of milligrams (mg) of salt (sodium) in one serving. Most people should limit total sodium intake to less than 2,300 mg per day.  Always check the nutrition information of foods labeled as "low-fat" or "nonfat". These foods may be  higher in added sugar or refined carbs and should be avoided.  Talk to your dietitian to identify your daily goals for nutrients listed on the label. Shopping  Avoid buying canned, premade, or processed foods. These foods tend to be high in fat, sodium, and added sugar.  Shop around the outside edge of the grocery store. This includes fresh fruits and vegetables, bulk grains, fresh meats, and fresh dairy. Cooking  Use low-heat cooking methods, such as baking, instead of high-heat cooking methods like deep frying.  Cook using healthy oils, such as olive, canola, or sunflower oil.  Avoid cooking with butter, cream, or high-fat meats. Meal planning  Eat meals and snacks regularly, preferably at the same times every day. Avoid going long periods of time without eating.  Eat foods high in fiber, such as fresh fruits, vegetables, beans, and whole grains. Talk to your dietitian about how many servings of carbs you can eat at each meal.  Eat 4-6 ounces (oz) of lean protein each day, such as lean meat, chicken, fish, eggs, or tofu. One oz of lean protein is equal to: ? 1 oz of meat, chicken, or fish. ? 1 egg. ?  cup of tofu.  Eat some foods each day that contain healthy fats, such as avocado, nuts, seeds, and fish. Lifestyle  Check your blood glucose regularly.  Exercise regularly as told by your health care provider. This may include: ? 150 minutes of moderate-intensity or vigorous-intensity exercise each week. This could be brisk walking, biking, or water aerobics. ? Stretching and doing strength exercises, such as yoga or weightlifting, at least 2 times a week.  Take medicines as told by your health care provider.  Do not use any products that contain nicotine or tobacco, such as cigarettes and e-cigarettes. If you need help quitting, ask your health care provider.  Work with a Social worker or diabetes educator to identify strategies to manage stress and any emotional and social  challenges. Questions to ask a health care provider  Do I need to meet with a diabetes educator?  Do I need to meet with a dietitian?  What number can I call if I have questions?  When are the best times to check my blood glucose? Where to find more information:  American Diabetes Association: diabetes.org  Academy of Nutrition and Dietetics: www.eatright.[employer] of Diabetes and Digestive and Kidney Diseases (NIH): [employer] Summary  A healthy meal plan will help you control your blood glucose and maintain a healthy lifestyle.  Working with a diet and nutrition specialist (dietitian) can help you make a meal plan that is best for you.  Keep in mind that carbohydrates (carbs) and alcohol have immediate effects on your blood glucose levels. It is important to count carbs and to use alcohol carefully. This information is not intended to replace advice given to you by your health care provider. Make sure you discuss any questions you have with your health care provider. Document Revised: 08/14/2017 Document Reviewed: 10/06/2016  Elsevier Patient Education  [employer].

## 2019-09-23 NOTE — Progress Notes (Addendum)
This visit was conducted in person.  BP 124/66 (BP Location: Left Arm, Patient Position: Sitting, Cuff Size: Large)   Pulse (!) 54   Temp 97.6 F (36.4 C) (Temporal)   Ht '5\' 9"'  (1.753 m)   Wt 223 lb 6 oz (101.3 kg)   SpO2 96%   BMI 32.99 kg/m    CC: DM f/u Subjective:    Patient ID: [name], male    DOB: 12/21/1955, 64 y.o.   MRN: [id number]  HPI: [name] is a 64 y.o. male presenting on 09/23/2019 for Diabetes (Here for f/u.) and Medication Refill (Requests refill for brand name Nexium. )   Notes fingernails more brittle recently.  Due for colonoscopy.  OSA on CPAP followed by Dr Annamaria Boots.   GERD - doing well on brand Nexium. Requests refill.  DM - does not regularly check sugars, fasting 152 this morning. Compliant with antihyperglycemic regimen which includes: metformin XR 54m bid, jardiance 167mdaily. No recent UTI, yeast infection, groin pain/cellulitis. Denies low sugars or hypoglycemic symptoms. Denies paresthesias. Last diabetic eye exam DUE. Pneumovax: 2017. Prevnar: not due. Glucometer brand: OneTouch. DSME: interested. POC machine is down today. Weight loss noted - has decreased portion sizes.  Lab Results  Component Value Date   HGBA1C 9.4 (H) 04/14/2019   Diabetic Foot Exam - Simple   Simple Foot Form Diabetic Foot exam was performed with the following findings: Yes 09/23/2019 10:52 AM  Visual Inspection No deformities, no ulcerations, no other skin breakdown bilaterally: Yes Sensation Testing Intact to touch and monofilament testing bilaterally: Yes Pulse Check Posterior Tibialis and Dorsalis pulse intact bilaterally: Yes Comments    Lab Results  Component Value Date   MICROALBUR 2.2 (H) 04/14/2019         Relevant past medical, surgical, family and social history reviewed and updated as indicated. Interim medical history since our last visit reviewed. Allergies and medications reviewed and updated. Outpatient Medications Prior to Visit    Medication Sig Dispense Refill  . aspirin EC 81 MG tablet Take 1 tablet (81 mg total) by mouth daily. 90 tablet 3  . atorvastatin (LIPITOR) 20 MG tablet Take 1 tablet (20 mg total) by mouth daily. 90 tablet 3  . Blood Glucose Monitoring Suppl (ONE TOUCH ULTRA 2) w/Device KIT Use as directed E11.65 1 kit 0  . empagliflozin (JARDIANCE) 10 MG TABS tablet Take 10 mg by mouth daily. 30 tablet 6  . hydrOXYzine (ATARAX/VISTARIL) 25 MG tablet Take 0.5-1 tablets (12.5-25 mg total) by mouth 2 (two) times daily as needed for anxiety. 30 tablet 0  . metFORMIN (GLUCOPHAGE-XR) 500 MG 24 hr tablet Take 2 tablets (1,000 mg total) by mouth daily with breakfast. 180 tablet 3  . metoprolol succinate (TOPROL-XL) 25 MG 24 hr tablet Take 0.5 tablets (12.5 mg total) by mouth daily. 45 tablet 3  . nitroGLYCERIN (NITROSTAT) 0.4 MG SL tablet Place 1 tablet (0.4 mg total) under the tongue every 5 (five) minutes as needed for chest pain. 25 tablet 0  . sildenafil (VIAGRA) 100 MG tablet Take 0.5 tablets (50 mg total) by mouth daily as needed for erectile dysfunction. 30 tablet 3  . esomeprazole (NEXIUM) 40 MG capsule Take 1 capsule (40 mg total) by mouth daily at 12 noon. 90 capsule 1  . glucose blood test strip Use as instructed 100 each 3  . OneTouch Delica Lancets 3362HISC 1 each by Other route 2 (two) times daily as needed. 100 each 3  No facility-administered medications prior to visit.     Per HPI unless specifically indicated in ROS section below Review of Systems Objective:    BP 124/66 (BP Location: Left Arm, Patient Position: Sitting, Cuff Size: Large)   Pulse (!) 54   Temp 97.6 F (36.4 C) (Temporal)   Ht '5\' 9"'  (1.753 m)   Wt 223 lb 6 oz (101.3 kg)   SpO2 96%   BMI 32.99 kg/m   Wt Readings from Last 3 Encounters:  09/23/19 223 lb 6 oz (101.3 kg)  04/19/19 225 lb (102.1 kg)  11/11/18 238 lb 9.6 oz (108.2 kg)    Physical Exam Vitals and nursing note reviewed.  Constitutional:      General: He is  not in acute distress.    Appearance: Normal appearance. He is well-developed. He is not ill-appearing.  HENT:     Head: Normocephalic and atraumatic.  Eyes:     General: No scleral icterus.    Extraocular Movements: Extraocular movements intact.     Conjunctiva/sclera: Conjunctivae normal.     Pupils: Pupils are equal, round, and reactive to light.  Cardiovascular:     Rate and Rhythm: Normal rate and regular rhythm.     Pulses: Normal pulses.     Heart sounds: Normal heart sounds. No murmur.  Pulmonary:     Effort: Pulmonary effort is normal. No respiratory distress.     Breath sounds: Normal breath sounds. No wheezing, rhonchi or rales.  Musculoskeletal:     Cervical back: Normal range of motion and neck supple.     Right lower leg: No edema.     Left lower leg: No edema.     Comments: See HPI for foot exam if done  Lymphadenopathy:     Cervical: No cervical adenopathy.  Skin:    General: Skin is warm and dry.     Findings: No rash.  Neurological:     Mental Status: He is alert.       Results for orders placed or performed in visit on 04/14/19  Microalbumin / creatinine urine ratio  Result Value Ref Range   Microalb, Ur 2.2 (H) 0.0 - 1.9 mg/dL   Creatinine,U 406.9 mg/dL   Microalb Creat Ratio 0.5 0.0 - 30.0 mg/g  PSA  Result Value Ref Range   PSA 0.47 0.10 - 4.00 ng/mL  Hemoglobin A1c  Result Value Ref Range   Hgb A1c MFr Bld 9.4 (H) 4.6 - 6.5 %  Comprehensive metabolic panel  Result Value Ref Range   Sodium 138 135 - 145 mEq/L   Potassium 4.3 3.5 - 5.1 mEq/L   Chloride 101 96 - 112 mEq/L   CO2 28 19 - 32 mEq/L   Glucose, Bld 225 (H) 70 - 99 mg/dL   BUN 12 6 - 23 mg/dL   Creatinine, Ser 0.98 0.40 - 1.50 mg/dL   Total Bilirubin 0.6 0.2 - 1.2 mg/dL   Alkaline Phosphatase 69 39 - 117 U/L   AST 29 0 - 37 U/L   ALT 33 0 - 53 U/L   Total Protein 7.0 6.0 - 8.3 g/dL   Albumin 4.2 3.5 - 5.2 g/dL   Calcium 9.3 8.4 - 10.5 mg/dL   GFR 77.42 >60.00 mL/min  Lipid  panel  Result Value Ref Range   Cholesterol 115 0 - 200 mg/dL   Triglycerides 170.0 (H) 0.0 - 149.0 mg/dL   HDL 32.90 (L) >39.00 mg/dL   VLDL 34.0 0.0 - 40.0 mg/dL   LDL  Cholesterol 48 0 - 99 mg/dL   Total CHOL/HDL Ratio 3    NonHDL 82.19    Assessment & Plan:  This visit occurred during the SARS-CoV-2 public health emergency.  Safety protocols were in place, including screening questions prior to the visit, additional usage of staff PPE, and extensive cleaning of exam room while observing appropriate contact time as indicated for disinfecting solutions.   Problem List Items Addressed This Visit    Hyperlipidemia LDL goal < 70 associated with type 2 diabetes mellitus ([place])   Gastroesophageal reflux disease without esophagitis    Doing well on brand nexium - requests refilled.       Relevant Medications   NEXIUM 40 MG capsule   Essential hypertension    Chronic, stable on current regimen. Continue.       Diabetes mellitus type 2, uncontrolled, with complications ([place]) - Primary    Chronic, doing better with addition of jardiance, tolerating well. Update A1c, if above goal, will increase jardiance to full dose. Will also refer to diabetes education. Pt agrees with plan. Trouble tolerating higher metformin dose.       Relevant Orders   Hemoglobin A1c    Other Visit Diagnoses    Need for influenza vaccination       Relevant Orders   Flu Vaccine QUAD 36+ mos IM (Completed)       Meds ordered this encounter  Medications  . DISCONTD: esomeprazole (NEXIUM) 40 MG capsule    Sig: Take 1 capsule (40 mg total) by mouth daily at 12 noon.    Dispense:  90 capsule    Refill:  1  . NEXIUM 40 MG capsule    Sig: Take 1 capsule (40 mg total) by mouth daily at 12 noon.    Dispense:  90 capsule    Refill:  1  . OneTouch Delica Lancets 91Y MISC    Sig: 1 each by Other route 2 (two) times daily as needed.    Dispense:  100 each    Refill:  3  . glucose blood test strip    Sig: Use as  instructed    Dispense:  100 each    Refill:  3    Formulate based on insurance preference/cost   Orders Placed This Encounter  Procedures  . Flu Vaccine QUAD 36+ mos IM  . Hemoglobin A1c  . Ambulatory referral to diabetic education    Referral Priority:   Routine    Referral Type:   Consultation    Referral Reason:   Specialty Services Required    Number of Visits Requested:   1    Patient instructions: Trial biotin hair/nail vitamin.  We will refer you for diabetes classes at [facility] today.  You are doing well today - if A1c staying high, we will increase jardiance to full dose.  Schedule eye exam at your convenience.  Schedule colonoscopy.   Follow up plan: Return in about 6 months (around 03/22/2020) for annual exam, prior fasting for blood work.  Ria Bush, MD

## 2019-09-23 NOTE — Assessment & Plan Note (Signed)
Doing well on brand nexium - requests refilled.

## 2019-09-23 NOTE — Assessment & Plan Note (Addendum)
Chronic, doing better with addition of jardiance, tolerating well. Update A1c, if above goal, will increase jardiance to full dose. Will also refer to diabetes education. Pt agrees with plan. Trouble tolerating higher metformin dose.

## 2019-11-02 ENCOUNTER — Other Ambulatory Visit: Payer: Self-pay

## 2019-11-02 ENCOUNTER — Encounter: Payer: Self-pay | Admitting: Internal Medicine

## 2019-11-02 ENCOUNTER — Ambulatory Visit: Payer: 59 | Admitting: Internal Medicine

## 2019-11-02 VITALS — BP 124/60 | HR 49 | Ht 70.0 in | Wt 218.8 lb

## 2019-11-02 DIAGNOSIS — R0609 Other forms of dyspnea: Secondary | ICD-10-CM

## 2019-11-02 DIAGNOSIS — R001 Bradycardia, unspecified: Secondary | ICD-10-CM | POA: Diagnosis not present

## 2019-11-02 DIAGNOSIS — E785 Hyperlipidemia, unspecified: Secondary | ICD-10-CM

## 2019-11-02 DIAGNOSIS — R0600 Dyspnea, unspecified: Secondary | ICD-10-CM

## 2019-11-02 DIAGNOSIS — R5383 Other fatigue: Secondary | ICD-10-CM | POA: Diagnosis not present

## 2019-11-02 DIAGNOSIS — I2510 Atherosclerotic heart disease of native coronary artery without angina pectoris: Secondary | ICD-10-CM | POA: Diagnosis not present

## 2019-11-02 NOTE — Patient Instructions (Signed)
Medication Instructions:  Your physician has recommended you make the following change in your medication:  1- STOP Metoprolol.  *If you need a refill on your cardiac medications before your next appointment, please call your pharmacy*  Lab Work: none If you have labs (blood work) drawn today and your tests are completely normal, you will receive your results only by: Marland Kitchen MyChart Message (if you have MyChart) OR . A paper copy in the mail If you have any lab test that is abnormal or we need to change your treatment, we will call you to review the results.  Testing/Procedures: Your physician has requested that you have an echocardiogram. Echocardiography is a painless test that uses sound waves to create images of your heart. It provides your doctor with information about the size and shape of your heart and how well your heart's chambers and valves are working. This procedure takes approximately one hour. There are no restrictions for this procedure. You may get an IV, if needed, to receive an ultrasound enhancing agent through to better visualize your heart.   Follow-Up: At [facility], you and your health needs are our priority.  As part of our continuing mission to provide you with exceptional heart care, we have created designated Provider Care Teams.  These Care Teams include your primary Cardiologist (physician) and Advanced Practice Providers (APPs -  Physician Assistants and Nurse Practitioners) who all work together to provide you with the care you need, when you need it.  Your next appointment:   6 week(s)  The format for your next appointment:   In Person  Provider:    You may see Nelva Bush, MD or one of the following Advanced Practice Providers on your designated Care Team:    Murray Hodgkins, NP  Christell Faith, PA-C  Marrianne Mood, PA-C   Echocardiogram An echocardiogram is a procedure that uses painless sound waves (ultrasound) to produce an image of the  heart. Images from an echocardiogram can provide important information about:  Signs of coronary artery disease (CAD).  Aneurysm detection. An aneurysm is a weak or damaged part of an artery wall that bulges out from the normal force of blood pumping through the body.  Heart size and shape. Changes in the size or shape of the heart can be associated with certain conditions, including heart failure, aneurysm, and CAD.  Heart muscle function.  Heart valve function.  Signs of a past heart attack.  Fluid buildup around the heart.  Thickening of the heart muscle.  A tumor or infectious growth around the heart valves. Tell a health care provider about:  Any allergies you have.  All medicines you are taking, including vitamins, herbs, eye drops, creams, and over-the-counter medicines.  Any blood disorders you have.  Any surgeries you have had.  Any medical conditions you have.  Whether you are pregnant or may be pregnant. What are the risks? Generally, this is a safe procedure. However, problems may occur, including:  Allergic reaction to dye (contrast) that may be used during the procedure. What happens before the procedure? No specific preparation is needed. You may eat and drink normally. What happens during the procedure?   An IV tube may be inserted into one of your veins.  You may receive contrast through this tube. A contrast is an injection that improves the quality of the pictures from your heart.  A gel will be applied to your chest.  A wand-like tool (transducer) will be moved over your chest. The  gel will help to transmit the sound waves from the transducer.  The sound waves will harmlessly bounce off of your heart to allow the heart images to be captured in real-time motion. The images will be recorded on a computer. The procedure may vary among health care providers and hospitals. What happens after the procedure?  You may return to your normal, everyday  life, including diet, activities, and medicines, unless your health care provider tells you not to do that. Summary  An echocardiogram is a procedure that uses painless sound waves (ultrasound) to produce an image of the heart.  Images from an echocardiogram can provide important information about the size and shape of your heart, heart muscle function, heart valve function, and fluid buildup around your heart.  You do not need to do anything to prepare before this procedure. You may eat and drink normally.  After the echocardiogram is completed, you may return to your normal, everyday life, unless your health care provider tells you not to do that. This information is not intended to replace advice given to you by your health care provider. Make sure you discuss any questions you have with your health care provider. Document Revised: 12/23/2018 Document Reviewed: 10/04/2016 Elsevier Patient Education  [place].

## 2019-11-02 NOTE — Progress Notes (Signed)
Follow-up Outpatient Visit Date: 11/02/2019  Primary Care Provider: Ria Bush, MD [place] [address]  Chief Complaint: Fatigue  HPI:  [name] is a 64 y.o. male with history of coronary artery disease s/p BMS to the LAD and RCA, as well as balloon angioplasty to LCx, hyperlipidemia, GERD and OSA on CPAP, who presents for follow-up of coronary artery disease, who presents for follow-up of coronary artery disease.  I last saw him in 10/2017, at which time he was doing well.  Today, [name] reports that he has been doing relatively well though he has experienced more fatigue and exertional dyspnea since I last saw him.  He is also concerned that wearing a mask when out of his home is making him more short of breath.  He will take a deep breath from time to time and feel like his breathing is back to normal.  He denies chest pain, palpitations, lightheadedness, and edema.  Home blood pressures are typically in the 120s'/70's with heart rates in the 50s.  --------------------------------------------------------------------------------------------------  Cardiovascular History & Procedures: Cardiovascular Problems:  CAD s/p PCI/POBA  Risk Factors:  Known CAD, hypertension, hyperlipidemia, male gender, and age > 3  Cath/PCI:  PCI (05/05/01): PCI to mid RCA with NIR 3.0 x 13 mm BMS (post-dilated with 3.25 mm balloon). POBA to mid LCx with 2.25 mm cutting balloon.  PCI (05/03/01): PCI to proximal LAD CTO with Pixel 2.5 x 23 mm BMS (post-dilated with 3.0 mm balloon); POBA to mid LAD stenosis with 2.25 mm balloon  LHC (04/30/01): LMCA normal. LAD with CTO of proximal vessel. LCx with large OM1 followed by 90% stenosis in small AV groove LCx. Dominant RCA with 90% midvessel stenosis. Preserved LVEF with mid/apical anterior akinesis.  CV Surgery:  None  EP Procedures and Devices:  None  Non-Invasive Evaluation(s):  Exercise myocardial  perfusion stress test (12/23/05): Fair exercise capacity without evidence of ischemia or scar. LVEF 53%.  Recent CV Pertinent Labs: Lab Results  Component Value Date   CHOL 115 04/14/2019   CHOL 101 10/20/2016   HDL 32.90 (L) 04/14/2019   HDL 36 (L) 10/20/2016   LDLCALC 48 04/14/2019   LDLCALC 48 10/20/2016   LDLDIRECT 70.8 06/30/2012   TRIG 170.0 (H) 04/14/2019   CHOLHDL 3 04/14/2019   K 4.3 04/14/2019   MG 1.9 10/20/2016   BUN 12 04/14/2019   BUN 13 10/20/2016   CREATININE 0.98 04/14/2019   CREATININE 0.91 07/13/2015    Past medical and surgical history were reviewed and updated in EPIC.  Current Meds  Medication Sig  . aspirin EC 81 MG tablet Take 1 tablet (81 mg total) by mouth daily.  Marland Kitchen atorvastatin (LIPITOR) 20 MG tablet Take 1 tablet (20 mg total) by mouth daily.  . Blood Glucose Monitoring Suppl (ONE TOUCH ULTRA 2) w/Device KIT Use as directed E11.65  . empagliflozin (JARDIANCE) 10 MG TABS tablet Take 10 mg by mouth daily.  Marland Kitchen glucose blood test strip Use as instructed  . metFORMIN (GLUCOPHAGE-XR) 500 MG 24 hr tablet Take 2 tablets (1,000 mg total) by mouth daily with breakfast.  . metoprolol succinate (TOPROL-XL) 25 MG 24 hr tablet Take 0.5 tablets (12.5 mg total) by mouth daily.  Marland Kitchen NEXIUM 40 MG capsule Take 1 capsule (40 mg total) by mouth daily at 12 noon.  . nitroGLYCERIN (NITROSTAT) 0.4 MG SL tablet Place 1 tablet (0.4 mg total) under the tongue every 5 (five) minutes as needed for chest  pain.  . OneTouch Delica Lancets 37T MISC 1 each by Other route 2 (two) times daily as needed.  . sildenafil (VIAGRA) 100 MG tablet Take 0.5 tablets (50 mg total) by mouth daily as needed for erectile dysfunction.    Allergies: Patient has no known allergies.  Social History   Tobacco Use  . Smoking status: Former Smoker    Packs/day: 1.00    Years: 15.00    Pack years: 15.00    Types: Cigarettes    Quit date: 09/15/2000    Years since quitting: 19.1  . Smokeless tobacco:  Never Used  Substance Use Topics  . Alcohol use: No  . Drug use: No    Family History  Problem Relation Age of Onset  . Lung cancer Father 44       smoker  . Hypertension Mother   . Diabetes Neg Hx   . Coronary artery disease Neg Hx   . Stroke Neg Hx   . Colon cancer Neg Hx   . Esophageal cancer Neg Hx   . Rectal cancer Neg Hx   . Stomach cancer Neg Hx     Review of Systems: A 12-system review of systems was performed and was negative except as noted in the HPI.  --------------------------------------------------------------------------------------------------  Physical Exam: BP 124/60 (BP Location: Left Arm, Patient Position: Sitting, Cuff Size: Normal)   Pulse (!) 49   Ht '5\' 10"'  (1.778 m)   Wt 218 lb 12 oz (99.2 kg)   SpO2 98%   BMI 31.39 kg/m   General: NAD. Neck: No JVD or HJR. Lungs: Clear to auscultation bilaterally without wheezes or crackles. Heart: Bradycardic but regular without murmurs, rubs, or gallops. Abdomen: Soft, nontender, nondistended. Extremities: No lower extremity edema.  EKG: Sinus bradycardia (heart rate 49 bpm.  Otherwise, no significant abnormality.  Lab Results  Component Value Date   WBC 9.0 08/27/2017   HGB 14.8 08/27/2017   HCT 43.4 08/27/2017   MCV 90.8 08/27/2017   PLT 209.0 08/27/2017    Lab Results  Component Value Date   NA 138 04/14/2019   K 4.3 04/14/2019   CL 101 04/14/2019   CO2 28 04/14/2019   BUN 12 04/14/2019   CREATININE 0.98 04/14/2019   GLUCOSE 225 (H) 04/14/2019   ALT 33 04/14/2019    Lab Results  Component Value Date   CHOL 115 04/14/2019   HDL 32.90 (L) 04/14/2019   LDLCALC 48 04/14/2019   LDLDIRECT 70.8 06/30/2012   TRIG 170.0 (H) 04/14/2019   CHOLHDL 3 04/14/2019    --------------------------------------------------------------------------------------------------  ASSESSMENT AND PLAN: Coronary artery disease: No angina reported.  Fatigue is nonspecific and could reflect worsening coronary  insufficiency.  We have agreed to obtain a transthoracic echocardiogram and discontinue metoprolol to see if this helps improve his symptoms before proceeding with ischemia evaluation.  We will continue aspirin and atorvastatin for secondary prevention.  Fatigue, dyspnea on exertion, and sinus bradycardia: We have agreed to discontinue metoprolol to see if this helps improve Mr. Naclerio's symptoms.  Given history of multivessel CAD, I have also recommended a transthoracic echocardiogram to ensure that structural abnormalities/cardiomyopathy do not underlie his symptoms.  Hyperlipidemia: LDL at goal on most recent check.  Continue atorvastatin 20 mg daily.  Follow-up: Return to clinic in 6 weeks  Nelva Bush, MD 11/02/2019 9:51 AM

## 2019-11-03 ENCOUNTER — Encounter: Payer: Self-pay | Admitting: Internal Medicine

## 2019-11-03 DIAGNOSIS — R5383 Other fatigue: Secondary | ICD-10-CM | POA: Insufficient documentation

## 2019-11-03 DIAGNOSIS — R001 Bradycardia, unspecified: Secondary | ICD-10-CM | POA: Insufficient documentation

## 2019-11-03 DIAGNOSIS — E785 Hyperlipidemia, unspecified: Secondary | ICD-10-CM | POA: Insufficient documentation

## 2019-11-10 ENCOUNTER — Other Ambulatory Visit: Payer: Self-pay | Admitting: Family Medicine

## 2019-11-14 ENCOUNTER — Ambulatory Visit: Payer: 59 | Admitting: Internal Medicine

## 2019-11-14 ENCOUNTER — Encounter: Payer: Self-pay | Admitting: Internal Medicine

## 2019-11-14 ENCOUNTER — Other Ambulatory Visit: Payer: Self-pay

## 2019-11-14 DIAGNOSIS — R0600 Dyspnea, unspecified: Secondary | ICD-10-CM

## 2019-11-14 DIAGNOSIS — G4733 Obstructive sleep apnea (adult) (pediatric): Secondary | ICD-10-CM | POA: Diagnosis not present

## 2019-11-14 DIAGNOSIS — R0609 Other forms of dyspnea: Secondary | ICD-10-CM

## 2019-11-14 NOTE — Assessment & Plan Note (Signed)
We can continue CPAP auto 5-20. Education done and reassured about CPAP home use and covid virus

## 2019-11-14 NOTE — Assessment & Plan Note (Signed)
Mostly noted DOE when heart rate was slow. Continues working on this with cardiology. Without cough or wheeze and with clear lungs on exam, I will wait on PFT at this time.

## 2019-11-14 NOTE — Progress Notes (Signed)
HPI male former smoker followed for OSA, complicated by CAD/ 3vPCI, DM2, GERD, HBP, hyperlipidemia NPSG 11/30/07 - AHI 70/ hr weight 260 lbs Office Spirometry 08/11/17-mild restriction of exhaled volume-FVC 3.40/71%, FEV1 2.79/77%, ratio 0.82, FEF 25-75% 3.52/118% HST 01/30/17-AHI 72.3/hour, desaturation to 76%, body weight 230 pounds ---------------------------------------------------------------------------------------------------  11/11/2018-  64 year old male former smoker followed for OSA, complicated by CAD/ 3vPCI, DM2, GERD, HBP, hyperlipidemia CPAP auto 5-20/ [employer] Download 83% compliance AHI 0.3/hour Body weight today 238 pounds -----Pt states he has been doing well since last visit and denies any concerns. He denies any problems with CPAP.  Admits he misses once in a while.  Occasional mild mask leak.  He checks his own BP and oximetry at home and denies other active medical concerns. CXR  03/05/18 No active cardiopulmonary disease.  11/14/19- 64 year old male former smoker followed for OSA, complicated by CAD/ 3vPCI, DM2, GERD, HBP, hyperlipidemia CPAP auto 5-20/ [employer] Download compliance 67%, AHI 0.5/ hr Body weight today 219 lbs Had flu vax, no Covid vax yet -----f/u OSA. Patient stated breathing is at his baseline.  He had backed off CPAP for awhile because he heard of hospital concern that it would spread the virus. Still falls asleep some nights before putting on CPAP, Weight loss possibly due to DM meds.  Taken off metoprolol due to slow heart beat. Now pending echo. No cough or wheeze. Rarely uses rescue inhaler if he feels a little choked in throat.   ROS-see HPI + = positive Constitutional:   + weight loss, night sweats, fevers, chills, fatigue, lassitude. HEENT:    headaches, difficulty swallowing, tooth/dental problems, sore throat,       sneezing, itching, ear ache, + nasal congestion, post nasal drip, snoring CV:    chest pain, orthopnea, PND, swelling in lower  extremities, anasarca,                                                          dizziness, palpitations Resp:   shortness of breath with exertion or at rest.                productive cough,   non-productive cough, coughing up of blood.              change in color of mucus.  wheezing.   Skin:    rash or lesions. GI:  No-   heartburn, indigestion, abdominal pain, nausea, vomiting, diarrhea,                 change in bowel habits, loss of appetite GU: dysuria, change in color of urine, no urgency or frequency.   flank pain. MS:   + Joint pain, stiffness, decreased range of motion, back pain. Neuro-     nothing unusual Psych:  change in mood or affect.  depression or anxiety.   memory loss.  OBJ- Physical Exam General- Alert, Oriented, Affect-appropriate, Distress- none acute Skin- rash-none, lesions- none, excoriation- none Lymphadenopathy- none Head- atraumatic, + small mandible            Eyes- Gross vision intact, PERRLA, conjunctivae and secretions clear            Ears- Hearing, canals-normal            Nose-+ turbinate edema +-Septal dev, mucus, polyps, erosion, perforation  Throat- Mallampati III , mucosa clear , drainage- none, tonsils- atrophic Neck- flexible , trachea midline, no stridor , thyroid nl, carotid no bruit Chest - symmetrical excursion , unlabored           Heart/CV- RRR , no murmur , no gallop  , no rub, nl s1 s2                           - JVD- none , edema- none, stasis changes- none, varices- none           Lung- clear to P&A, wheeze- none, cough- none , dullness-none, rub- none           Chest wall-  Abd-  Br/ Gen/ Rectal- Not done, not indicated Extrem- cyanosis- none, clubbing, none, atrophy- none, strength- nl Neuro- grossly intact to observation

## 2019-11-14 NOTE — Patient Instructions (Addendum)
We can continue CPAP auto 5-20, mask of choice,humidifier, supplies, Airview/ card  Our goal is to use CPAP all night, every night.  Please call if we can help

## 2019-12-13 ENCOUNTER — Other Ambulatory Visit: Payer: Self-pay

## 2019-12-13 ENCOUNTER — Ambulatory Visit (INDEPENDENT_AMBULATORY_CARE_PROVIDER_SITE_OTHER): Payer: 59

## 2019-12-13 DIAGNOSIS — R0600 Dyspnea, unspecified: Secondary | ICD-10-CM

## 2019-12-13 DIAGNOSIS — R0609 Other forms of dyspnea: Secondary | ICD-10-CM

## 2019-12-13 MED ORDER — PERFLUTREN LIPID MICROSPHERE
1.0000 mL | INTRAVENOUS | Status: AC | PRN
  Administered 2019-12-13: 2 mL via INTRAVENOUS

## 2019-12-14 ENCOUNTER — Encounter: Payer: Self-pay | Admitting: Internal Medicine

## 2019-12-14 ENCOUNTER — Ambulatory Visit (INDEPENDENT_AMBULATORY_CARE_PROVIDER_SITE_OTHER): Payer: 59 | Admitting: Internal Medicine

## 2019-12-14 VITALS — BP 130/78 | HR 65 | Ht 70.0 in | Wt 216.1 lb

## 2019-12-14 DIAGNOSIS — E785 Hyperlipidemia, unspecified: Secondary | ICD-10-CM | POA: Diagnosis not present

## 2019-12-14 DIAGNOSIS — I2510 Atherosclerotic heart disease of native coronary artery without angina pectoris: Secondary | ICD-10-CM

## 2019-12-14 NOTE — Progress Notes (Signed)
Follow-up Outpatient Visit Date: 12/14/2019  Primary Care Provider: Ria Bush, [place] [address]  Chief Complaint: Follow-up shortness of breath and coronary artery disease  HPI:  [name] is a 64 y.o. male with history of artery disease s/p BMS to the LAD and RCA in 2002, as well as balloon angioplasty to LCx, hyperlipidemia, GERD and OSA on CPAP, who presents for follow-up of coronary artery disease.  I last saw [name] a month ago, at which time he reported increasing exertional dyspnea and fatigue.  We agreed to discontinue metoprolol and obtain a transthoracic echocardiogram.  This showed a normal LVEF with mild mitral regurgitation and trivial aortic regurgitation.  Today, [name] reports that he is feeling better, with less fatigue and dyspnea since stopping metoprolol.  He denies chest pain, palpitations, lightheadedness, and edema.  He happily reports that he has lost about 20 to 30 pounds over the last year.  --------------------------------------------------------------------------------------------------  Cardiovascular History & Procedures: Cardiovascular Problems:  CAD s/p PCI/POBA  Risk Factors:  Known CAD, hypertension, hyperlipidemia, male gender, and age > 110  Cath/PCI:  PCI (05/05/01): PCI to mid RCA with NIR 3.0 x 13 mm BMS (post-dilated with 3.25 mm balloon). POBA to mid LCx with 2.25 mm cutting balloon.  PCI (05/03/01): PCI to proximal LAD CTO with Pixel 2.5 x 23 mm BMS (post-dilated with 3.0 mm balloon); POBA to mid LAD stenosis with 2.25 mm balloon  LHC (04/30/01): LMCA normal. LAD with CTO of proximal vessel. LCx with large OM1 followed by 90% stenosis in small AV groove LCx. Dominant RCA with 90% midvessel stenosis. Preserved LVEF with mid/apical anterior akinesis.  CV Surgery:  None  EP Procedures and Devices:  None  Non-Invasive Evaluation(s):  TTE (12/13/2019): Borderline LV enlargement and  hypertrophy with LVEF 55-60%.  Normal diastolic function and wall motion.  Normal RV size and function.  Mild right atrial enlargement.  Mild mitral regurgitation.  Exercise myocardial perfusion stress test (12/23/05): Fair exercise capacity without evidence of ischemia or scar. LVEF 53%.  Recent CV Pertinent Labs: Lab Results  Component Value Date   CHOL 115 04/14/2019   CHOL 101 10/20/2016   HDL 32.90 (L) 04/14/2019   HDL 36 (L) 10/20/2016   LDLCALC 48 04/14/2019   LDLCALC 48 10/20/2016   LDLDIRECT 70.8 06/30/2012   TRIG 170.0 (H) 04/14/2019   CHOLHDL 3 04/14/2019   K 4.3 04/14/2019   MG 1.9 10/20/2016   BUN 12 04/14/2019   BUN 13 10/20/2016   CREATININE 0.98 04/14/2019   CREATININE 0.91 07/13/2015    Past medical and surgical history were reviewed and updated in EPIC.  Current Meds  Medication Sig  . aspirin EC 81 MG tablet Take 1 tablet (81 mg total) by mouth daily.  Marland Kitchen atorvastatin (LIPITOR) 20 MG tablet Take 1 tablet (20 mg total) by mouth daily.  . Blood Glucose Monitoring Suppl (ONE TOUCH ULTRA 2) w/Device KIT Use as directed E11.65  . glucose blood test strip Use as instructed  . hydrOXYzine (ATARAX/VISTARIL) 25 MG tablet Take 0.5-1 tablets (12.5-25 mg total) by mouth 2 (two) times daily as needed for anxiety.  Marland Kitchen JARDIANCE 10 MG TABS tablet TAKE 1 TABLET BY MOUTH EVERY DAY  . metFORMIN (GLUCOPHAGE-XR) 500 MG 24 hr tablet Take 2 tablets (1,000 mg total) by mouth daily with breakfast.  . NEXIUM 40 MG capsule Take 1 capsule (40 mg total) by mouth daily at 12 noon.  . nitroGLYCERIN (NITROSTAT) 0.4  MG SL tablet Place 1 tablet (0.4 mg total) under the tongue every 5 (five) minutes as needed for chest pain.  Glory Rosebush Delica Lancets 22V MISC 1 each by Other route 2 (two) times daily as needed.  . sildenafil (VIAGRA) 100 MG tablet Take 0.5 tablets (50 mg total) by mouth daily as needed for erectile dysfunction.    Allergies: Patient has no known allergies.  Social History    Tobacco Use  . Smoking status: Former Smoker    Packs/day: 1.00    Years: 15.00    Pack years: 15.00    Types: Cigarettes    Quit date: 09/15/2000    Years since quitting: 19.2  . Smokeless tobacco: Never Used  Substance Use Topics  . Alcohol use: No  . Drug use: No    Family History  Problem Relation Age of Onset  . Lung cancer Father 44       smoker  . Hypertension Mother   . Diabetes Neg Hx   . Coronary artery disease Neg Hx   . Stroke Neg Hx   . Colon cancer Neg Hx   . Esophageal cancer Neg Hx   . Rectal cancer Neg Hx   . Stomach cancer Neg Hx     Review of Systems: A 12-system review of systems was performed and was negative except as noted in the HPI.  --------------------------------------------------------------------------------------------------  Physical Exam: BP 130/78 (BP Location: Left Arm, Patient Position: Sitting, Cuff Size: Normal)   Pulse 65   Ht '5\' 10"'  (1.778 m)   Wt 216 lb 2 oz (98 kg)   SpO2 97%   BMI 31.01 kg/m   General: NAD. Neck: No JVD or HJR. Lungs: Clear to auscultation bilaterally without wheezes or crackles. Heart: Regular rate and rhythm with 1/6 systolic murmur.  Extremities: No lower extremity edema.  EKG: Sinus bradycardia with short PR interval and poor R wave progression in V1 and V2.  Lab Results  Component Value Date   WBC 9.0 08/27/2017   HGB 14.8 08/27/2017   HCT 43.4 08/27/2017   MCV 90.8 08/27/2017   PLT 209.0 08/27/2017    Lab Results  Component Value Date   NA 138 04/14/2019   K 4.3 04/14/2019   CL 101 04/14/2019   CO2 28 04/14/2019   BUN 12 04/14/2019   CREATININE 0.98 04/14/2019   GLUCOSE 225 (H) 04/14/2019   ALT 33 04/14/2019    Lab Results  Component Value Date   CHOL 115 04/14/2019   HDL 32.90 (L) 04/14/2019   LDLCALC 48 04/14/2019   LDLDIRECT 70.8 06/30/2012   TRIG 170.0 (H) 04/14/2019   CHOLHDL 3 04/14/2019     --------------------------------------------------------------------------------------------------  ASSESSMENT AND PLAN: Coronary artery disease: Overall, [name] feels better since discontinuation of metoprolol.  Echocardiogram showed normal LVEF without wall motion abnormalities or significant valvular pathology.  Given improving symptoms, we have agreed to defer ischemia evaluation for now.  Continue current medications for secondary prevention.  Hyperlipidemia: LDL at goal.  Continue atorvastatin 20 mg daily.  Follow-up: Return to clinic in 6 months.  Nelva Bush, MD 12/14/2019 4:06 PM

## 2019-12-14 NOTE — Patient Instructions (Signed)
Medication Instructions:  Your physician recommends that you continue on your current medications as directed. Please refer to the Current Medication list given to you today.  *If you need a refill on your cardiac medications before your next appointment, please call your pharmacy*   Lab Work: none If you have labs (blood work) drawn today and your tests are completely normal, you will receive your results only by: Marland Kitchen MyChart Message (if you have MyChart) OR . A paper copy in the mail If you have any lab test that is abnormal or we need to change your treatment, we will call you to review the results.   Testing/Procedures: none   Follow-Up: At [facility], you and your health needs are our priority.  As part of our continuing mission to provide you with exceptional heart care, we have created designated Provider Care Teams.  These Care Teams include your primary Cardiologist (physician) and Advanced Practice Providers (APPs -  Physician Assistants and Nurse Practitioners) who all work together to provide you with the care you need, when you need it.  We recommend signing up for the patient portal called "MyChart".  Sign up information is provided on this After Visit Summary.  MyChart is used to connect with patients for Virtual Visits (Telemedicine).  Patients are able to view lab/test results, encounter notes, upcoming appointments, etc.  Non-urgent messages can be sent to your provider as well.   To learn more about what you can do with MyChart, go to [email or web address].    Your next appointment:   6 month(s)  The format for your next appointment:   In Person  Provider:    You may see Nelva Bush, MD or one of the following Advanced Practice Providers on your designated Care Team:    Murray Hodgkins, NP  Christell Faith, PA-C  Marrianne Mood, PA-C

## 2019-12-16 ENCOUNTER — Encounter: Payer: Self-pay | Admitting: Internal Medicine

## 2020-04-09 ENCOUNTER — Other Ambulatory Visit: Payer: Self-pay | Admitting: Family Medicine

## 2020-04-09 NOTE — Telephone Encounter (Signed)
Atorvastatin was last refilled 04/19/19 for #90 with 3 refills. I do not see Metoprolol on current medication list  Patient has upcoming CPE on 04/23/20.

## 2020-04-19 ENCOUNTER — Other Ambulatory Visit: Payer: Self-pay | Admitting: Family Medicine

## 2020-04-19 DIAGNOSIS — IMO0002 Reserved for concepts with insufficient information to code with codable children: Secondary | ICD-10-CM

## 2020-04-19 DIAGNOSIS — Z125 Encounter for screening for malignant neoplasm of prostate: Secondary | ICD-10-CM

## 2020-04-19 DIAGNOSIS — E1165 Type 2 diabetes mellitus with hyperglycemia: Secondary | ICD-10-CM

## 2020-04-19 DIAGNOSIS — E1169 Type 2 diabetes mellitus with other specified complication: Secondary | ICD-10-CM

## 2020-04-20 ENCOUNTER — Other Ambulatory Visit (INDEPENDENT_AMBULATORY_CARE_PROVIDER_SITE_OTHER): Payer: 59

## 2020-04-20 ENCOUNTER — Other Ambulatory Visit: Payer: Self-pay

## 2020-04-20 DIAGNOSIS — E1169 Type 2 diabetes mellitus with other specified complication: Secondary | ICD-10-CM | POA: Diagnosis not present

## 2020-04-20 DIAGNOSIS — IMO0002 Reserved for concepts with insufficient information to code with codable children: Secondary | ICD-10-CM

## 2020-04-20 DIAGNOSIS — Z125 Encounter for screening for malignant neoplasm of prostate: Secondary | ICD-10-CM | POA: Diagnosis not present

## 2020-04-20 DIAGNOSIS — E785 Hyperlipidemia, unspecified: Secondary | ICD-10-CM

## 2020-04-20 DIAGNOSIS — E118 Type 2 diabetes mellitus with unspecified complications: Secondary | ICD-10-CM

## 2020-04-20 DIAGNOSIS — E1165 Type 2 diabetes mellitus with hyperglycemia: Secondary | ICD-10-CM

## 2020-04-20 LAB — COMPREHENSIVE METABOLIC PANEL
ALT: 36 U/L (ref 0–53)
AST: 27 U/L (ref 0–37)
Albumin: 4.3 g/dL (ref 3.5–5.2)
Alkaline Phosphatase: 85 U/L (ref 39–117)
BUN: 14 mg/dL (ref 6–23)
CO2: 28 mEq/L (ref 19–32)
Calcium: 9.6 mg/dL (ref 8.4–10.5)
Chloride: 102 mEq/L (ref 96–112)
Creatinine, Ser: 0.88 mg/dL (ref 0.40–1.50)
GFR: 87.37 mL/min (ref >60.00)
Glucose, Bld: 155 mg/dL — ABNORMAL HIGH (ref 70–99)
Potassium: 4.3 mEq/L (ref 3.5–5.1)
Sodium: 136 mEq/L (ref 135–145)
Total Bilirubin: 0.8 mg/dL (ref 0.2–1.2)
Total Protein: 7.3 g/dL (ref 6.0–8.3)

## 2020-04-20 LAB — LIPID PANEL
Cholesterol: 114 mg/dL (ref 0–200)
HDL: 33.6 mg/dL — ABNORMAL LOW (ref >39.00)
LDL Cholesterol: 50 mg/dL (ref 0–99)
NonHDL: 79.99
Total CHOL/HDL Ratio: 3
Triglycerides: 151 mg/dL — ABNORMAL HIGH (ref 0.0–149.0)
VLDL: 30.2 mg/dL (ref 0.0–40.0)

## 2020-04-20 LAB — MICROALBUMIN / CREATININE URINE RATIO
Creatinine,U: 113 mg/dL
Microalb Creat Ratio: 0.7 mg/g (ref 0.0–30.0)
Microalb, Ur: 0.7 mg/dL (ref 0.0–1.9)

## 2020-04-20 LAB — PSA: PSA: 0.6 ng/mL (ref 0.10–4.00)

## 2020-04-20 LAB — HEMOGLOBIN A1C: Hgb A1c MFr Bld: 7.5 % — ABNORMAL HIGH (ref 4.6–6.5)

## 2020-04-23 ENCOUNTER — Other Ambulatory Visit: Payer: Self-pay

## 2020-04-23 ENCOUNTER — Ambulatory Visit (INDEPENDENT_AMBULATORY_CARE_PROVIDER_SITE_OTHER): Payer: 59 | Admitting: Family Medicine

## 2020-04-23 ENCOUNTER — Encounter: Payer: Self-pay | Admitting: Family Medicine

## 2020-04-23 VITALS — BP 140/76 | HR 69 | Temp 97.7°F | Ht 68.5 in | Wt 204.0 lb

## 2020-04-23 DIAGNOSIS — I10 Essential (primary) hypertension: Secondary | ICD-10-CM

## 2020-04-23 DIAGNOSIS — E1169 Type 2 diabetes mellitus with other specified complication: Secondary | ICD-10-CM

## 2020-04-23 DIAGNOSIS — N529 Male erectile dysfunction, unspecified: Secondary | ICD-10-CM

## 2020-04-23 DIAGNOSIS — R634 Abnormal weight loss: Secondary | ICD-10-CM | POA: Diagnosis not present

## 2020-04-23 DIAGNOSIS — Z0000 Encounter for general adult medical examination without abnormal findings: Secondary | ICD-10-CM | POA: Diagnosis not present

## 2020-04-23 DIAGNOSIS — K219 Gastro-esophageal reflux disease without esophagitis: Secondary | ICD-10-CM

## 2020-04-23 DIAGNOSIS — D126 Benign neoplasm of colon, unspecified: Secondary | ICD-10-CM

## 2020-04-23 DIAGNOSIS — E785 Hyperlipidemia, unspecified: Secondary | ICD-10-CM

## 2020-04-23 DIAGNOSIS — E669 Obesity, unspecified: Secondary | ICD-10-CM

## 2020-04-23 DIAGNOSIS — E66811 Obesity, class 1: Secondary | ICD-10-CM

## 2020-04-23 DIAGNOSIS — E118 Type 2 diabetes mellitus with unspecified complications: Secondary | ICD-10-CM

## 2020-04-23 DIAGNOSIS — E1165 Type 2 diabetes mellitus with hyperglycemia: Secondary | ICD-10-CM

## 2020-04-23 DIAGNOSIS — IMO0002 Reserved for concepts with insufficient information to code with codable children: Secondary | ICD-10-CM

## 2020-04-23 MED ORDER — METFORMIN HCL ER 500 MG PO TB24: 500.0000 mg | ORAL_TABLET | Freq: Two times a day (BID) | ORAL | 3 refills | Status: DC

## 2020-04-23 MED ORDER — EMPAGLIFLOZIN 25 MG PO TABS: 25.0000 mg | ORAL_TABLET | Freq: Every day | ORAL | 3 refills | Status: DC

## 2020-04-23 MED ORDER — NEXIUM 40 MG PO CPDR: 40.0000 mg | DELAYED_RELEASE_CAPSULE | Freq: Every day | ORAL | 3 refills | Status: DC

## 2020-04-23 MED ORDER — VIAGRA 100 MG PO TABS: 50.0000 mg | ORAL_TABLET | Freq: Every day | ORAL | 3 refills | Status: DC | PRN

## 2020-04-23 NOTE — Assessment & Plan Note (Addendum)
Noted since retirement - more active. 12 lbs since 11/2019. Overdue for colonoscopy

## 2020-04-23 NOTE — Assessment & Plan Note (Addendum)
Very overdue for rpt colonoscopy - advised call and schedule this.

## 2020-04-23 NOTE — Assessment & Plan Note (Signed)
Stable period on brand nexium - continue this.

## 2020-04-23 NOTE — Assessment & Plan Note (Addendum)
Chronic, above goal. Will increase jardiance to 25mg  daily, continue metformin XR 1000mg  daily. Reviewed side effects to watch for on higher jardiance dose. RTC 6 mo DM f/u visit.

## 2020-04-23 NOTE — Assessment & Plan Note (Addendum)
Preventative protocols reviewed and updated unless pt declined. Discussed healthy diet and lifestyle.  Encouraged he get COVID vaccine.

## 2020-04-23 NOTE — Patient Instructions (Addendum)
You are overdue for colonoscopy! Call Dr Allison Quarry or Pyrtle to schedule follow up.  Check with insurance about shingles vaccine (shingrix) - and may schedule nurse visit  I do recommend the COVID vaccine.  Return as needed or in 6 months for diabetes follow up visit.   Health Maintenance, Male Adopting a healthy lifestyle and getting preventive care are important in promoting health and wellness. Ask your health care provider about:  The right schedule for you to have regular tests and exams.  Things you can do on your own to prevent diseases and keep yourself healthy. What should I know about diet, weight, and exercise? Eat a healthy diet   Eat a diet that includes plenty of vegetables, fruits, low-fat dairy products, and lean protein.  Do not eat a lot of foods that are high in solid fats, added sugars, or sodium. Maintain a healthy weight Body mass index (BMI) is a measurement that can be used to identify possible weight problems. It estimates body fat based on height and weight. Your health care provider can help determine your BMI and help you achieve or maintain a healthy weight. Get regular exercise Get regular exercise. This is one of the most important things you can do for your health. Most adults should:  Exercise for at least 150 minutes each week. The exercise should increase your heart rate and make you sweat (moderate-intensity exercise).  Do strengthening exercises at least twice a week. This is in addition to the moderate-intensity exercise.  Spend less time sitting. Even light physical activity can be beneficial. Watch cholesterol and blood lipids Have your blood tested for lipids and cholesterol at 63 years of age, then have this test every 5 years. You may need to have your cholesterol levels checked more often if:  Your lipid or cholesterol levels are high.  You are older than 63 years of age.  You are at high risk for heart disease. What should I know  about cancer screening? Many types of cancers can be detected early and may often be prevented. Depending on your health history and family history, you may need to have cancer screening at various ages. This may include screening for:  Colorectal cancer.  Prostate cancer.  Skin cancer.  Lung cancer. What should I know about heart disease, diabetes, and high blood pressure? Blood pressure and heart disease  High blood pressure causes heart disease and increases the risk of stroke. This is more likely to develop in people who have high blood pressure readings, are of African descent, or are overweight.  Talk with your health care provider about your target blood pressure readings.  Have your blood pressure checked: ? Every 3-5 years if you are 10-53 years of age. ? Every year if you are 1 years old or older.  If you are between the ages of 91 and 40 and are a current or former smoker, ask your health care provider if you should have a one-time screening for abdominal aortic aneurysm (AAA). Diabetes Have regular diabetes screenings. This checks your fasting blood sugar level. Have the screening done:  Once every three years after age 71 if you are at a normal weight and have a low risk for diabetes.  More often and at a younger age if you are overweight or have a high risk for diabetes. What should I know about preventing infection? Hepatitis B If you have a higher risk for hepatitis B, you should be screened for this virus. Talk with  your health care provider to find out if you are at risk for hepatitis B infection. Hepatitis C Blood testing is recommended for:  Everyone born from [birth year] through 1964.  Anyone with known risk factors for hepatitis C. Sexually transmitted infections (STIs)  You should be screened each year for STIs, including gonorrhea and chlamydia, if: ? You are sexually active and are younger than 63 years of age. ? You are older than 63 years of age and your  health care provider tells you that you are at risk for this type of infection. ? Your sexual activity has changed since you were last screened, and you are at increased risk for chlamydia or gonorrhea. Ask your health care provider if you are at risk.  Ask your health care provider about whether you are at high risk for HIV. Your health care provider may recommend a prescription medicine to help prevent HIV infection. If you choose to take medicine to prevent HIV, you should first get tested for HIV. You should then be tested every 3 months for as long as you are taking the medicine. Follow these instructions at home: Lifestyle  Do not use any products that contain nicotine or tobacco, such as cigarettes, e-cigarettes, and chewing tobacco. If you need help quitting, ask your health care provider.  Do not use street drugs.  Do not share needles.  Ask your health care provider for help if you need support or information about quitting drugs. Alcohol use  Do not drink alcohol if your health care provider tells you not to drink.  If you drink alcohol: ? Limit how much you have to 0-2 drinks a day. ? Be aware of how much alcohol is in your drink. In the [place] one drink equals one 12 oz bottle of beer (355 mL), one 5 oz glass of wine (148 mL), or one 1 oz glass of hard liquor (44 mL). General instructions  Schedule regular health, dental, and eye exams.  Stay current with your vaccines.  Tell your health care provider if: ? You often feel depressed. ? You have ever been abused or do not feel safe at home. Summary  Adopting a healthy lifestyle and getting preventive care are important in promoting health and wellness.  Follow your health care provider's instructions about healthy diet, exercising, and getting tested or screened for diseases.  Follow your health care provider's instructions on monitoring your cholesterol and blood pressure. This information is not intended to replace  advice given to you by your health care provider. Make sure you discuss any questions you have with your health care provider. Document Revised: 08/25/2018 Document Reviewed: 08/25/2018 Elsevier Patient Education  2020 [employer].

## 2020-04-23 NOTE — Assessment & Plan Note (Signed)
Chronic, stable. Continue current regimen. 

## 2020-04-23 NOTE — Assessment & Plan Note (Signed)
Chronic, stable. Continue lipitor 20mg daily. 

## 2020-04-23 NOTE — Assessment & Plan Note (Signed)
viagra refilled. Pt aware to avoid use with nitro.

## 2020-04-23 NOTE — Progress Notes (Signed)
This visit was conducted in person.  BP 140/76 (BP Location: Left Arm, Patient Position: Sitting, Cuff Size: Large)   Pulse 69   Temp 97.7 F (36.5 C) (Temporal)   Ht 5' 8.5" (1.74 m)   Wt 204 lb (92.5 kg)   SpO2 96%   BMI 30.57 kg/m   BP Readings from Last 3 Encounters:  04/23/20 140/76  12/14/19 130/78  11/14/19 116/68    CC: CPE Subjective:    Patient ID: [name], male    DOB: 1956-05-20, 64 y.o.   MRN: [id number]  HPI: [name] is a 65 y.o. male presenting on 04/23/2020 for Annual Exam (Requests 90-day rx for Jardiance. )   Daughter just moved to [place] - husband deployed there.  [facility] - followed by Dr Annamaria Boots  DM - continues metformin XR and jardiance. Attributes worse control to recent road travel, eating out, more soft drinks, more hectic lifestyle recently.   Preventative: COLONOSCOPY Date: 06/2012 15 polyps - adenomatous, mild diverticulosis, rpt 1 yr ([facility]) - overdue for rpt - he will call for appointment.  Prostate cancer screening - previously say urology ([facility]). would like to continue screening at our office Lung cancer screening CT - not eligible Flu shot - yearly  Tetanus 2012  Pneumovax - 2017  COVID vaccine - discussed, encouraged  Shingrix - discussed  Seat belt use discussed  Sunscreen use discussed, no changing moles on skin  Ex-smoker quit 2002, prior ~ 50 PY hx Alcohol - occasional Dentist q6 mo - due Eye exam - due has appt scheduled  Caffeine: 1 pepsi/day Lives with wife, 1 dog, 2 cats. Grown daughter. 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 Interim medical history since our last visit reviewed. Allergies and medications reviewed and updated. Outpatient Medications Prior to Visit  Medication Sig Dispense  Refill  . aspirin EC 81 MG tablet Take 1 tablet (81 mg total) by mouth daily. 90 tablet 3  . atorvastatin (LIPITOR) 20 MG tablet TAKE 1 TABLET BY MOUTH EVERY DAY 90 tablet 3  . Blood Glucose Monitoring Suppl (ONE TOUCH ULTRA 2) w/Device KIT Use as directed E11.65 1 kit 0  . glucose blood test strip Use as instructed 100 each 3  . hydrOXYzine (ATARAX/VISTARIL) 25 MG tablet Take 0.5-1 tablets (12.5-25 mg total) by mouth 2 (two) times daily as needed for anxiety. 30 tablet 0  . nitroGLYCERIN (NITROSTAT) 0.4 MG SL tablet Place 1 tablet (0.4 mg total) under the tongue every 5 (five) minutes as needed for chest pain. 25 tablet 0  . OneTouch Delica Lancets 67J MISC 1 each by Other route 2 (two) times daily as needed. 100 each 3  . JARDIANCE 10 MG TABS tablet TAKE 1 TABLET BY MOUTH EVERY DAY 30 tablet 6  . metFORMIN (GLUCOPHAGE-XR) 500 MG 24 hr tablet Take 2 tablets (1,000 mg total) by mouth daily with breakfast. 180 tablet 3  . NEXIUM 40 MG capsule Take 1 capsule (40 mg total) by mouth daily at 12 noon. 90 capsule 1  . sildenafil (VIAGRA) 100 MG tablet Take 0.5 tablets (50 mg total) by mouth daily as needed for erectile dysfunction. 30 tablet 3  . metoprolol succinate (TOPROL-XL) 25 MG 24 hr tablet TAKE 1/2 TABLET BY MOUTH EVERY DAY 45 tablet 3   No facility-administered  medications prior to visit.     Per HPI unless specifically indicated in ROS section below Review of Systems  Constitutional: Negative for activity change, appetite change, chills, fatigue, fever and unexpected weight change.  HENT: Negative for hearing loss.   Eyes: Negative for visual disturbance.  Respiratory: Negative for cough, chest tightness, shortness of breath and wheezing.   Cardiovascular: Negative for chest pain, palpitations and leg swelling.  Gastrointestinal: Positive for diarrhea (?metformin related). Negative for abdominal distention, abdominal pain, blood in stool, constipation, nausea and vomiting.    Genitourinary: Negative for difficulty urinating and hematuria.  Musculoskeletal: Negative for arthralgias, myalgias and neck pain.  Skin: Negative for rash.  Neurological: Negative for dizziness, seizures, syncope and headaches.  Hematological: Negative for adenopathy. Does not bruise/bleed easily.  Psychiatric/Behavioral: Negative for dysphoric mood. The patient is not nervous/anxious.    Objective:  BP 140/76 (BP Location: Left Arm, Patient Position: Sitting, Cuff Size: Large)   Pulse 69   Temp 97.7 F (36.5 C) (Temporal)   Ht 5' 8.5" (1.74 m)   Wt 204 lb (92.5 kg)   SpO2 96%   BMI 30.57 kg/m   Wt Readings from Last 3 Encounters:  04/23/20 204 lb (92.5 kg)  12/14/19 216 lb 2 oz (98 kg)  11/14/19 219 lb 12.8 oz (99.7 kg)      Physical Exam Vitals and nursing note reviewed.  Constitutional:      General: He is not in acute distress.    Appearance: Normal appearance. He is well-developed. He is not ill-appearing.  HENT:     Head: Normocephalic and atraumatic.     Right Ear: Hearing, tympanic membrane, ear canal and external ear normal.     Left Ear: Hearing, tympanic membrane, ear canal and external ear normal.  Eyes:     General: No scleral icterus.    Extraocular Movements: Extraocular movements intact.     Conjunctiva/sclera: Conjunctivae normal.     Pupils: Pupils are equal, round, and reactive to light.  Cardiovascular:     Rate and Rhythm: Normal rate and regular rhythm.     Pulses: Normal pulses.          Radial pulses are 2+ on the right side and 2+ on the left side.     Heart sounds: Normal heart sounds. No murmur heard.   Pulmonary:     Effort: Pulmonary effort is normal. No respiratory distress.     Breath sounds: Normal breath sounds. No wheezing, rhonchi or rales.  Abdominal:     General: Abdomen is flat. Bowel sounds are normal. There is no distension.     Palpations: Abdomen is soft. There is no mass.     Tenderness: There is no abdominal tenderness.  There is no guarding or rebound.     Hernia: No hernia is present.  Genitourinary:    Prostate: Normal. Not enlarged (20gm), not tender and no nodules present.     Rectum: Normal. No mass, tenderness, anal fissure, external hemorrhoid or internal hemorrhoid.  Musculoskeletal:        General: Normal range of motion.     Cervical back: Normal range of motion and neck supple.     Right lower leg: No edema.     Left lower leg: No edema.  Lymphadenopathy:     Cervical: No cervical adenopathy.  Skin:    General: Skin is warm and dry.     Findings: No rash.  Neurological:     General: No focal deficit present.       Mental Status: He is alert and oriented to person, place, and time.     Comments: CN grossly intact, station and gait intact  Psychiatric:        Mood and Affect: Mood normal.        Behavior: Behavior normal.        Thought Content: Thought content normal.        Judgment: Judgment normal.       Results for orders placed or performed in visit on 04/20/20  PSA  Result Value Ref Range   PSA 0.60 0.10 - 4.00 ng/mL  Microalbumin / creatinine urine ratio  Result Value Ref Range   Microalb, Ur 0.7 0.0 - 1.9 mg/dL   Creatinine,U 113.0 mg/dL   Microalb Creat Ratio 0.7 0.0 - 30.0 mg/g  Hemoglobin A1c  Result Value Ref Range   Hgb A1c MFr Bld 7.5 (H) 4.6 - 6.5 %  Comprehensive metabolic panel  Result Value Ref Range   Sodium 136 135 - 145 mEq/L   Potassium 4.3 3.5 - 5.1 mEq/L   Chloride 102 96 - 112 mEq/L   CO2 28 19 - 32 mEq/L   Glucose, Bld 155 (H) 70 - 99 mg/dL   BUN 14 6 - 23 mg/dL   Creatinine, Ser 0.88 0.40 - 1.50 mg/dL   Total Bilirubin 0.8 0.2 - 1.2 mg/dL   Alkaline Phosphatase 85 39 - 117 U/L   AST 27 0 - 37 U/L   ALT 36 0 - 53 U/L   Total Protein 7.3 6.0 - 8.3 g/dL   Albumin 4.3 3.5 - 5.2 g/dL   GFR 87.37 >60.00 mL/min   Calcium 9.6 8.4 - 10.5 mg/dL  Lipid panel  Result Value Ref Range   Cholesterol 114 0 - 200 mg/dL   Triglycerides 151.0 (H) 0 - 149  mg/dL   HDL 33.60 (L) >39.00 mg/dL   VLDL 30.2 0.0 - 40.0 mg/dL   LDL Cholesterol 50 0 - 99 mg/dL   Total CHOL/HDL Ratio 3    NonHDL 79.99    Assessment & Plan:  This visit occurred during the SARS-CoV-2 public health emergency.  Safety protocols were in place, including screening questions prior to the visit, additional usage of staff PPE, and extensive cleaning of exam room while observing appropriate contact time as indicated for disinfecting solutions.   Problem List Items Addressed This Visit    Weight loss    Noted since retirement - more active. 12 lbs since 11/2019. Overdue for colonoscopy      Obesity, Class I, BMI 30.0-34.9 (see actual BMI)    Encouraged healthy diet and lifestyle choices to affect sustainable weight loss. Discussed weight loss to date.      Hyperlipidemia LDL goal < 70 associated with type 2 diabetes mellitus (HCC)    Chronic, stable. Continue lipitor 20mg daily.       Relevant Medications   empagliflozin (JARDIANCE) 25 MG TABS tablet   metFORMIN (GLUCOPHAGE-XR) 500 MG 24 hr tablet   Healthcare maintenance - Primary    Preventative protocols reviewed and updated unless pt declined. Discussed healthy diet and lifestyle.  Encouraged he get COVID vaccine.       Gastroesophageal reflux disease without esophagitis    Stable period on brand nexium - continue this.       Relevant Medications   NEXIUM 40 MG capsule   Essential hypertension    Chronic, stable. Continue current regimen.       Relevant Medications   VIAGRA 100   MG tablet   Erectile dysfunction    viagra refilled. Pt aware to avoid use with nitro.       Diabetes mellitus type 2, uncontrolled, with complications (HCC)    Chronic, above goal. Will increase jardiance to 55m daily, continue metformin XR 10060mdaily. Reviewed side effects to watch for on higher jardiance dose. RTC 6 mo DM f/u visit.       Relevant Medications   empagliflozin (JARDIANCE) 25 MG TABS tablet   metFORMIN  (GLUCOPHAGE-XR) 500 MG 24 hr tablet   Colon polyps    Very overdue for rpt colonoscopy - advised call and schedule this.           Meds ordered this encounter  Medications  . empagliflozin (JARDIANCE) 25 MG TABS tablet    Sig: Take 1 tablet (25 mg total) by mouth daily.    Dispense:  90 tablet    Refill:  3  . metFORMIN (GLUCOPHAGE-XR) 500 MG 24 hr tablet    Sig: Take 1 tablet (500 mg total) by mouth in the morning and at bedtime.    Dispense:  180 tablet    Refill:  3  . NEXIUM 40 MG capsule    Sig: Take 1 capsule (40 mg total) by mouth daily at 12 noon.    Dispense:  90 capsule    Refill:  3  . VIAGRA 100 MG tablet    Sig: Take 0.5 tablets (50 mg total) by mouth daily as needed for erectile dysfunction.    Dispense:  30 tablet    Refill:  3   No orders of the defined types were placed in this encounter.   Patient instructions: You are overdue for colonoscopy! Call Dr MaAllison Quarryr Pyrtle to schedule follow up.  Check with insurance about shingles vaccine (shingrix) - and may schedule nurse visit  I do recommend the COVID vaccine.  Return as needed or in 6 months for diabetes follow up visit.   Follow up plan: Return in about 6 months (around 10/24/2020) for follow up visit.  JaRia BushMD

## 2020-04-23 NOTE — Assessment & Plan Note (Addendum)
Encouraged healthy diet and lifestyle choices to affect sustainable weight loss. Discussed weight loss to date.

## 2020-05-09 IMAGING — DX DG CERVICAL SPINE COMPLETE 4+V
7 series · 7 of 7 positions shown · non-contrast
Comparison: None

CLINICAL DATA: Paresthesias of BILATERAL upper extremities, acute
RIGHT-side thoracic back pain

EXAM:
CERVICAL SPINE - COMPLETE 4+ VIEW

[c-spine lat]
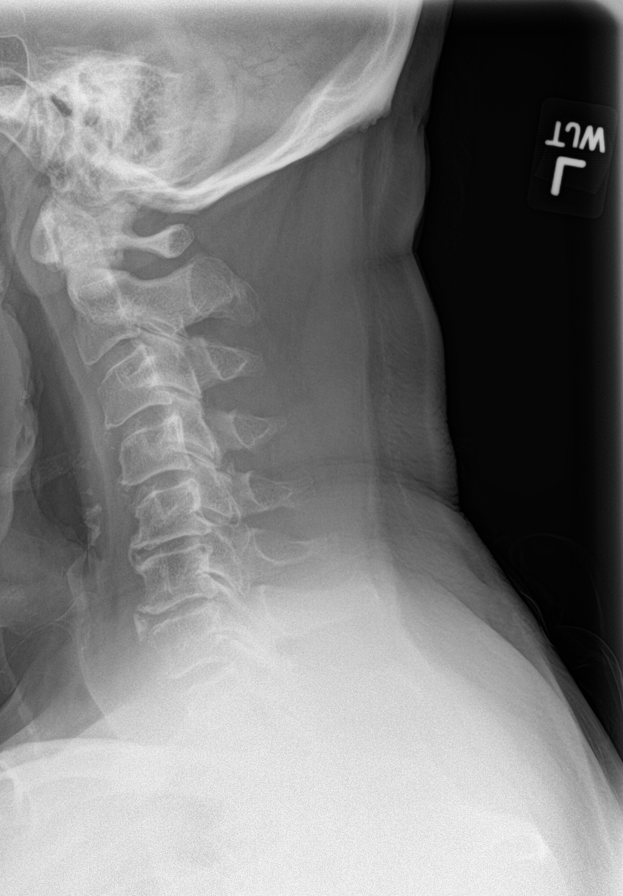

[c-spine obl (1 of 2)]
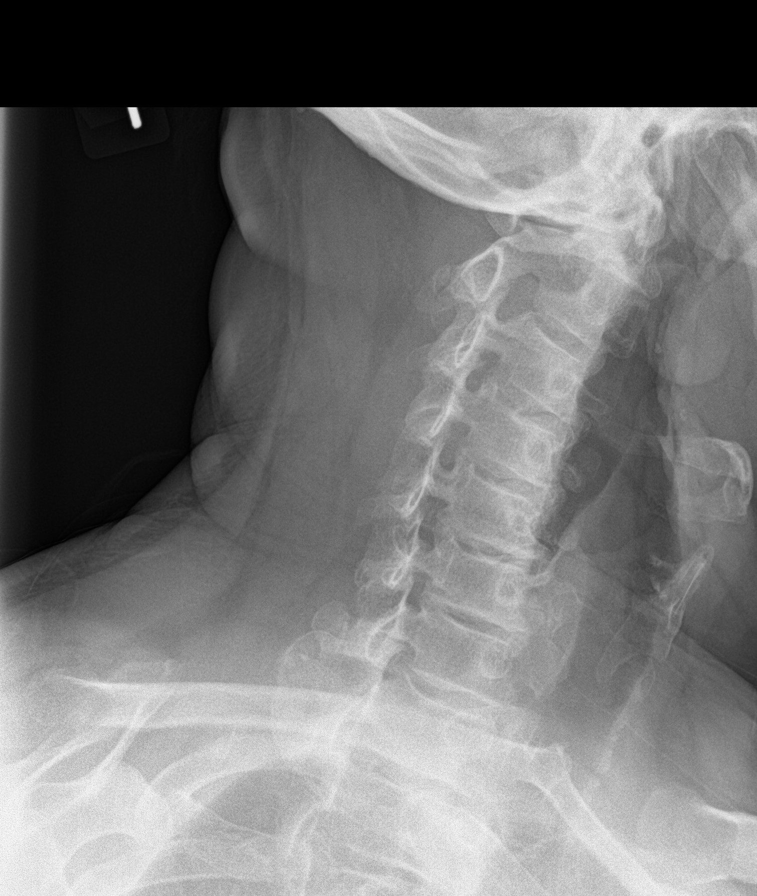

[c-spine obl (2 of 2)]
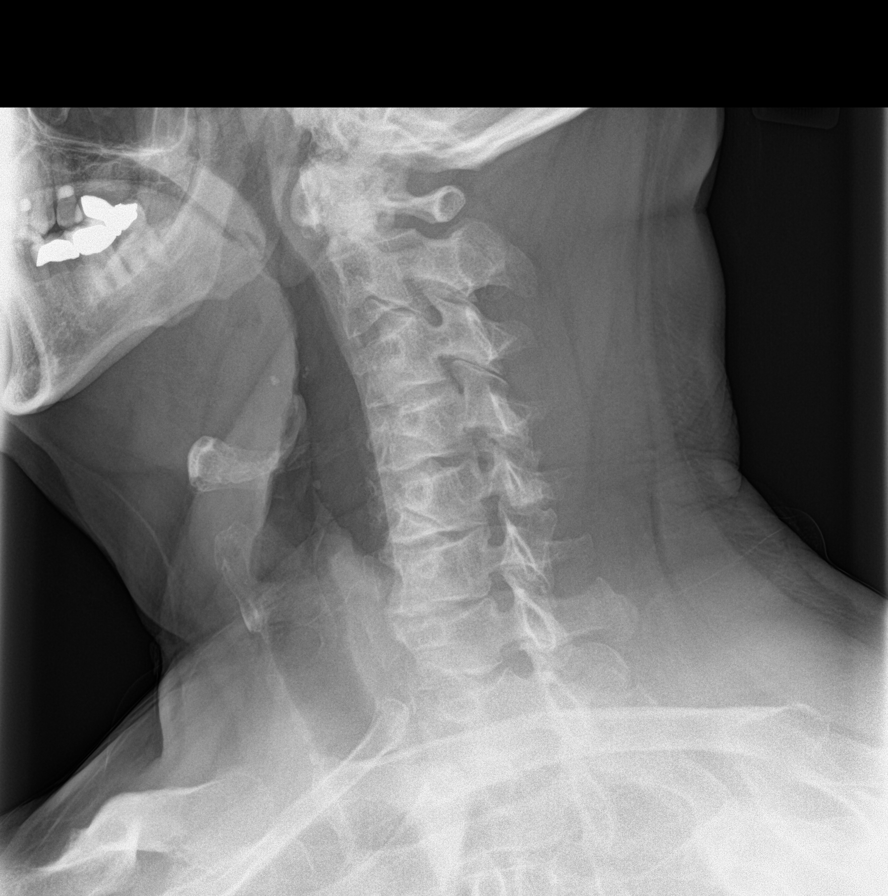

[c-spine ap]
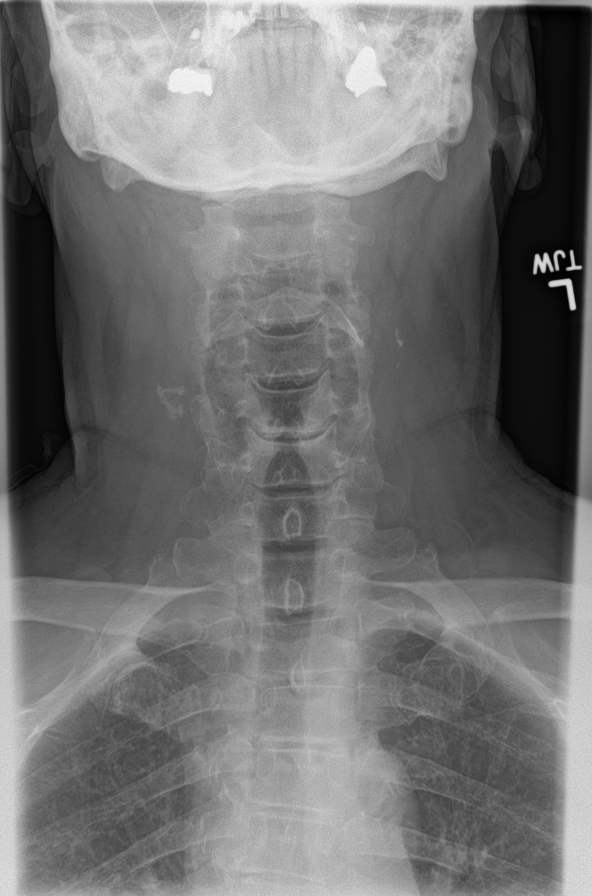

[c-spine open mouth]
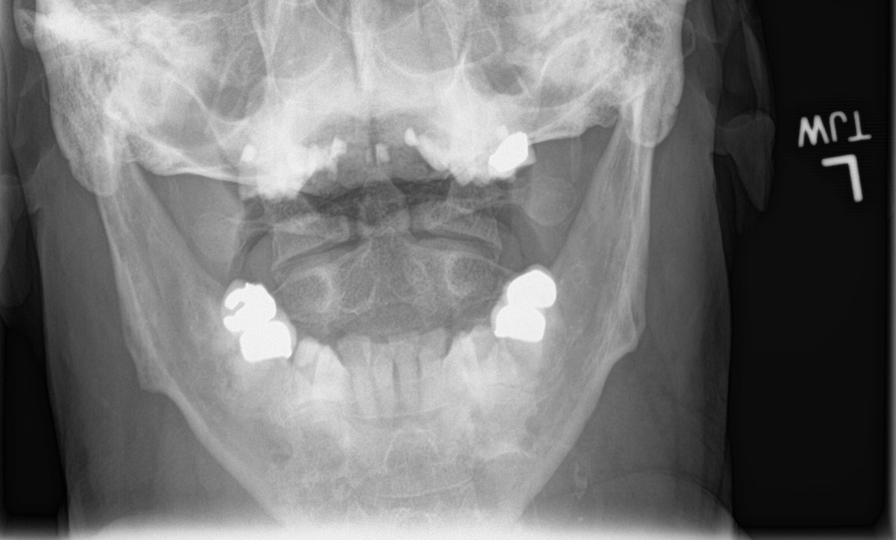

[c-spine swimmers (1 of 2)]
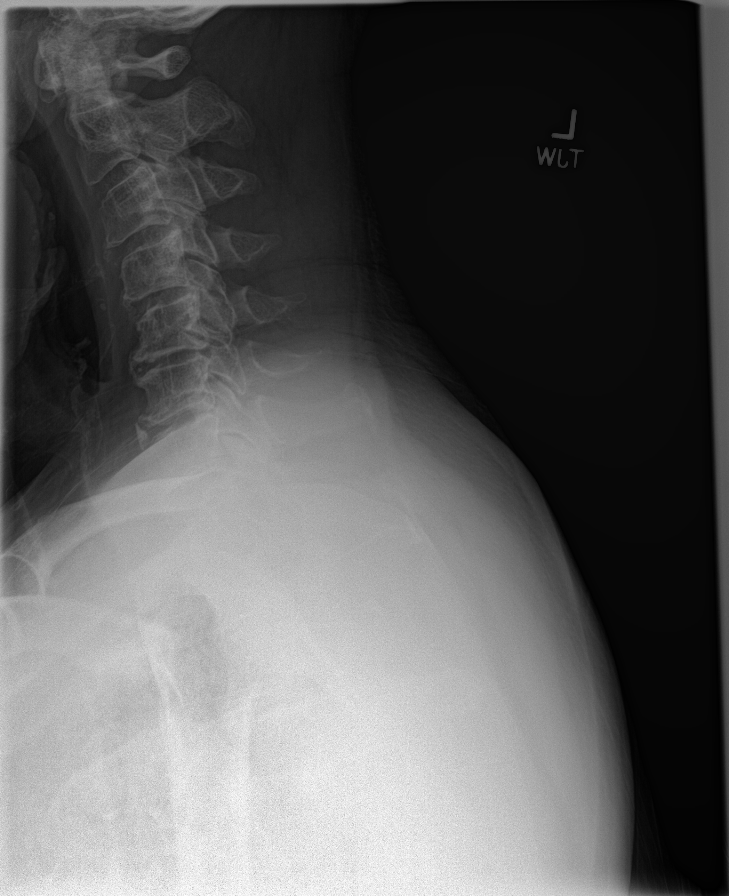

[c-spine swimmers (2 of 2)]
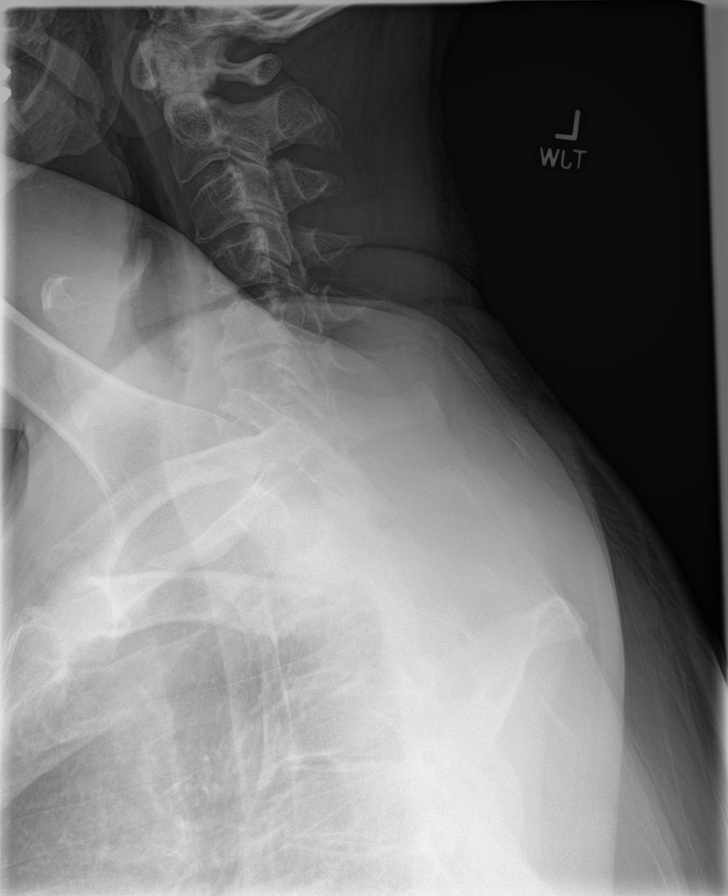

[7 of 7 positions shown; findings below may reference images not displayed]

FINDINGS: Prevertebral soft tissues normal thickness.

Osseous mineralization appears low normal.

Reversal of cervical lordosis question muscle spasm.

Disc space narrowing with endplate spur formation C5-C6 and C6-C7.

Vertebral body heights maintained without fracture or subluxation.

No bone destruction.

Small to vertebral spurs mildly impinge upon cervical neural
foramina bilaterally.

Lung apices clear.

C1-C2 alignment normal.

Atherosclerotic calcifications of the carotid systems bilaterally.
IMPRESSION: Degenerative disc disease changes of the cervical spine as above.

Question muscle spasm.

No acute osseous abnormalities.

## 2020-06-18 ENCOUNTER — Other Ambulatory Visit: Payer: Self-pay

## 2020-06-18 ENCOUNTER — Ambulatory Visit (INDEPENDENT_AMBULATORY_CARE_PROVIDER_SITE_OTHER): Payer: 59 | Admitting: Internal Medicine

## 2020-06-18 ENCOUNTER — Encounter: Payer: Self-pay | Admitting: Internal Medicine

## 2020-06-18 VITALS — BP 124/72 | HR 66 | Ht 69.0 in | Wt 211.2 lb

## 2020-06-18 DIAGNOSIS — E785 Hyperlipidemia, unspecified: Secondary | ICD-10-CM | POA: Diagnosis not present

## 2020-06-18 DIAGNOSIS — I2510 Atherosclerotic heart disease of native coronary artery without angina pectoris: Secondary | ICD-10-CM

## 2020-06-18 DIAGNOSIS — E1159 Type 2 diabetes mellitus with other circulatory complications: Secondary | ICD-10-CM

## 2020-06-18 DIAGNOSIS — R209 Unspecified disturbances of skin sensation: Secondary | ICD-10-CM | POA: Diagnosis not present

## 2020-06-18 MED ORDER — NITROGLYCERIN 0.4 MG SL SUBL: 0.4000 mg | SUBLINGUAL_TABLET | SUBLINGUAL | 1 refills | Status: DC | PRN

## 2020-06-18 NOTE — Patient Instructions (Signed)
Medication Instructions:  Your physician recommends that you continue on your current medications as directed. Please refer to the Current Medication list given to you today.  *If you need a refill on your cardiac medications before your next appointment, please call your pharmacy*  Follow-Up: At [facility], you and your health needs are our priority.  As part of our continuing mission to provide you with exceptional heart care, we have created designated Provider Care Teams.  These Care Teams include your primary Cardiologist (physician) and Advanced Practice Providers (APPs -  Physician Assistants and Nurse Practitioners) who all work together to provide you with the care you need, when you need it.  We recommend signing up for the patient portal called "MyChart".  Sign up information is provided on this After Visit Summary.  MyChart is used to connect with patients for Virtual Visits (Telemedicine).  Patients are able to view lab/test results, encounter notes, upcoming appointments, etc.  Non-urgent messages can be sent to your provider as well.   To learn more about what you can do with MyChart, go to [email or web address].    Your next appointment:   12 month(s)  The format for your next appointment:   In Person  Provider:   You may see Christopher End, MD or one of the following Advanced Practice Providers on your designated Care Team:    Christopher Berge, NP  Ryan Dunn, PA-C  Jacquelyn Visser, PA-C  Cadence Furth, PA-C  

## 2020-06-18 NOTE — Progress Notes (Signed)
Follow-up Outpatient Visit Date: 06/18/2020  Primary Care Provider: Ria Bush, MD [place] [address]  Chief Complaint: [name] feet  HPI:  [name] is a 64 y.o. male with history of coronary artery disease status post bare-metal stents to the LAD and RCA in 2002 as well as angioplasty to the LCx, hyperlipidemia, GERD, and obstructive sleep apnea on CPAP, who presents for follow-up of coronary artery disease.  I last saw [name] in March, at which time he noted less fatigue and shortness of breath since discontinuation of metoprolol in February.  We did not make any medication changes or pursue additional testing at that time.  Today, [name] reports that he has been feeling well other than intermittent cold feet, especially at night.  This has been present for about a year.  He also notes occasional cramps in his legs, right greater than left.  He does not have any pain in his thighs or calves with activity.  He also denies back pain.  He has not noticed any ulcers or wounds on his feet/legs.  Tonight continues to do well from a heart standpoint without chest pain, shortness of breath, palpitations, lightheadedness, and edema.  Energy is back to baseline after previous discontinuation of metoprolol.  --------------------------------------------------------------------------------------------------  Cardiovascular History & Procedures: Cardiovascular Problems:  CAD s/p PCI/POBA  Risk Factors:  Known CAD, hypertension, hyperlipidemia, male gender, and age > 47  Cath/PCI:  PCI (05/05/01): PCI to mid RCA with NIR 3.0 x 13 mm BMS (post-dilated with 3.25 mm balloon). POBA to mid LCx with 2.25 mm cutting balloon.  PCI (05/03/01): PCI to proximal LAD CTO with Pixel 2.5 x 23 mm BMS (post-dilated with 3.0 mm balloon); POBA to mid LAD stenosis with 2.25 mm balloon  LHC (04/30/01): LMCA normal. LAD with CTO of proximal vessel. LCx with large OM1 followed by  90% stenosis in small AV groove LCx. Dominant RCA with 90% midvessel stenosis. Preserved LVEF with mid/apical anterior akinesis.  CV Surgery:  None  EP Procedures and Devices:  None  Non-Invasive Evaluation(s):  TTE (12/13/2019): Borderline LV enlargement and hypertrophy with LVEF 55-60%.  Normal diastolic function and wall motion.  Normal RV size and function.  Mild right atrial enlargement.  Mild mitral regurgitation.  Exercise myocardial perfusion stress test (12/23/05): Fair exercise capacity without evidence of ischemia or scar. LVEF 53%.  Recent CV Pertinent Labs: Lab Results  Component Value Date   CHOL 114 04/20/2020   CHOL 101 10/20/2016   HDL 33.60 (L) 04/20/2020   HDL 36 (L) 10/20/2016   LDLCALC 50 04/20/2020   LDLCALC 48 10/20/2016   LDLDIRECT 70.8 06/30/2012   TRIG 151.0 (H) 04/20/2020   CHOLHDL 3 04/20/2020   K 4.3 04/20/2020   MG 1.9 10/20/2016   BUN 14 04/20/2020   BUN 13 10/20/2016   CREATININE 0.88 04/20/2020   CREATININE 0.91 07/13/2015    Past medical and surgical history were reviewed and updated in EPIC.  Current Meds  Medication Sig  . aspirin EC 81 MG tablet Take 1 tablet (81 mg total) by mouth daily.  Marland Kitchen atorvastatin (LIPITOR) 20 MG tablet TAKE 1 TABLET BY MOUTH EVERY DAY  . Blood Glucose Monitoring Suppl (ONE TOUCH ULTRA 2) w/Device KIT Use as directed E11.65  . empagliflozin (JARDIANCE) 25 MG TABS tablet Take 1 tablet (25 mg total) by mouth daily.  Marland Kitchen glucose blood test strip Use as instructed  . hydrOXYzine (ATARAX/VISTARIL) 25 MG tablet Take 0.5-1 tablets (  12.5-25 mg total) by mouth 2 (two) times daily as needed for anxiety.  . metFORMIN (GLUCOPHAGE-XR) 500 MG 24 hr tablet Take 1 tablet (500 mg total) by mouth in the morning and at bedtime.  Marland Kitchen NEXIUM 40 MG capsule Take 1 capsule (40 mg total) by mouth daily at 12 noon.  . nitroGLYCERIN (NITROSTAT) 0.4 MG SL tablet Place 1 tablet (0.4 mg total) under the tongue every 5 (five) minutes as  needed for chest pain.  Glory Rosebush Delica Lancets 12I MISC 1 each by Other route 2 (two) times daily as needed.  Marland Kitchen VIAGRA 100 MG tablet Take 0.5 tablets (50 mg total) by mouth daily as needed for erectile dysfunction.    Allergies: Patient has no known allergies.  Social History   Tobacco Use  . Smoking status: Former Smoker    Packs/day: 1.00    Years: 15.00    Pack years: 15.00    Types: Cigarettes    Quit date: 09/15/2000    Years since quitting: 19.7  . Smokeless tobacco: Never Used  Vaping Use  . Vaping Use: Never used  Substance Use Topics  . Alcohol use: No  . Drug use: No    Family History  Problem Relation Age of Onset  . Lung cancer Father 41       smoker  . Hypertension Mother   . Diabetes Neg Hx   . Coronary artery disease Neg Hx   . Stroke Neg Hx   . Colon cancer Neg Hx   . Esophageal cancer Neg Hx   . Rectal cancer Neg Hx   . Stomach cancer Neg Hx     Review of Systems: A 12-system review of systems was performed and was negative except as noted in the HPI.  --------------------------------------------------------------------------------------------------  Physical Exam: BP 124/72 (BP Location: Left Arm, Patient Position: Sitting, Cuff Size: Normal)   Pulse 66   Ht '5\' 9"'  (1.753 m)   Wt 211 lb 4 oz (95.8 kg)   SpO2 98%   BMI 31.20 kg/m   General: NAD. HEENT: No conjunctival pallor or scleral icterus. Facemask in place. Neck: No JVD or HJR. Lungs: Normal work of breathing. Clear to auscultation bilaterally without wheezes or crackles. Heart: Regular rate and rhythm without murmurs, rubs, or gallops. Abd: Bowel sounds present. Soft, NT/ND. Ext: No lower extremity edema. Radial, PT, and DP pulses are 2+ bilaterally.  Normal capillary refill in both great toes.   Skin: Warm and dry without rash.  EKG: Normal sinus rhythm without abnormality.  Lab Results  Component Value Date   WBC 9.0 08/27/2017   HGB 14.8 08/27/2017   HCT 43.4 08/27/2017    MCV 90.8 08/27/2017   PLT 209.0 08/27/2017    Lab Results  Component Value Date   NA 136 04/20/2020   K 4.3 04/20/2020   CL 102 04/20/2020   CO2 28 04/20/2020   BUN 14 04/20/2020   CREATININE 0.88 04/20/2020   GLUCOSE 155 (H) 04/20/2020   ALT 36 04/20/2020    Lab Results  Component Value Date   CHOL 114 04/20/2020   HDL 33.60 (L) 04/20/2020   LDLCALC 50 04/20/2020   LDLDIRECT 70.8 06/30/2012   TRIG 151.0 (H) 04/20/2020   CHOLHDL 3 04/20/2020    --------------------------------------------------------------------------------------------------  ASSESSMENT AND PLAN: Coronary artery disease: [name] continues to do well without symptoms to suggest worsening coronary insufficiency.  We will continue his current medications for secondary prevention.  Fatigue has resolved with discontinuation of metoprolol.  Refill for sublingual nitroglycerin has been provided.  Cold feet: Foot exam today is unrevealing without evidence of wounds or skin breakdown.  Posterior tibial and dorsalis pedis pulses are brisk bilaterally.  Additionally, there is no evidence of pretibial hair loss that would suggest underlying PAD.  [name] also denies claudication.  We discussed utility of checking ABIs, during my suspicion for significant PAD is low.  We have therefore agreed to defer testing.  Hyperlipidemia: LDL is well controlled.  Continue atorvastatin 20 mg daily.  Type 2 diabetes mellitus: Most recent hemoglobin A1c was just above goal at 7.5.  Continue Metformin and empagliflozin, with ongoing management per Dr. Danise Mina.  Follow-up: Return to clinic in 1 year.  Nelva Bush, MD 06/18/2020 11:29 AM

## 2020-10-17 ENCOUNTER — Other Ambulatory Visit: Payer: Self-pay | Admitting: Family Medicine

## 2020-10-29 ENCOUNTER — Ambulatory Visit: Payer: 59 | Admitting: Family Medicine

## 2020-11-13 ENCOUNTER — Other Ambulatory Visit: Payer: Self-pay

## 2020-11-13 ENCOUNTER — Encounter: Payer: Self-pay | Admitting: Internal Medicine

## 2020-11-13 ENCOUNTER — Ambulatory Visit: Payer: 59 | Admitting: Internal Medicine

## 2020-11-13 DIAGNOSIS — G4733 Obstructive sleep apnea (adult) (pediatric): Secondary | ICD-10-CM

## 2020-11-13 DIAGNOSIS — I2510 Atherosclerotic heart disease of native coronary artery without angina pectoris: Secondary | ICD-10-CM | POA: Diagnosis not present

## 2020-11-13 NOTE — Progress Notes (Signed)
HPI male former smoker followed for OSA, complicated by CAD/ 3vPCI, DM2, GERD, HBP, hyperlipidemia NPSG 11/30/07 - AHI 70/ hr weight 260 lbs Office Spirometry 08/11/17-mild restriction of exhaled volume-FVC 3.40/71%, FEV1 2.79/77%, ratio 0.82, FEF 25-75% 3.52/118% HST 01/30/17-AHI 72.3/hour, desaturation to 76%, body weight 230 pounds ---------------------------------------------------------------------------------------------------  11/14/19- 65 year old male former smoker followed for OSA, complicated by CAD/ 3vPCI, DM2, GERD, HBP, hyperlipidemia CPAP auto 5-20/ [employer] Download compliance 67%, AHI 0.5/ hr Body weight today 219 lbs Had flu vax, no Covid vax yet -----f/u OSA. Patient stated breathing is at his baseline.  He had backed off CPAP for awhile because he heard of hospital concern that it would spread the virus. Still falls asleep some nights before putting on CPAP, Weight loss possibly due to DM meds.  Taken off metoprolol due to slow heart beat. Now pending echo. No cough or wheeze. Rarely uses rescue inhaler if he feels a little choked in throat.   11/13/20-  65 year old male former smoker followed for OSA, complicated by CAD/ 3vPCI, DM2, GERD, HBP, hyperlipidemia CPAP auto 5-20/ [employer] Download- compliance 43% AHI 0.1/ hr Body weight today-217 lbs Covid vax- None Flu vax-had Machine now about 65 years old. He had questions about pressure and humidifier. Download reviewed.  Had questions about [facility] and might be a candidate, but doesn't want surgery. Admits he feels better when he wears CPAP. Still says he falls asleep before putting CPAP on.   ROS-see HPI + = positive Constitutional:   + weight loss, night sweats, fevers, chills, fatigue, lassitude. HEENT:    headaches, difficulty swallowing, tooth/dental problems, sore throat,       sneezing, itching, ear ache, + nasal congestion, post nasal drip, snoring CV:    chest pain, orthopnea, PND, swelling in lower extremities, anasarca,                                                           dizziness, palpitations Resp:   shortness of breath with exertion or at rest.                productive cough,   non-productive cough, coughing up of blood.              change in color of mucus.  wheezing.   Skin:    rash or lesions. GI:  No-   heartburn, indigestion, abdominal pain, nausea, vomiting, diarrhea,                 change in bowel habits, loss of appetite GU: dysuria, change in color of urine, no urgency or frequency.   flank pain. MS:   + Joint pain, stiffness, decreased range of motion, back pain. Neuro-     nothing unusual Psych:  change in mood or affect.  depression or anxiety.   memory loss.  OBJ- Physical Exam General- Alert, Oriented, Affect-appropriate, Distress- none acute Skin- rash-none, lesions- none, excoriation- none Lymphadenopathy- none Head- atraumatic, + small mandible            Eyes- Gross vision intact, PERRLA, conjunctivae and secretions clear            Ears- Hearing, canals-normal            Nose-+ turbinate edema +-Septal dev, mucus, polyps, erosion, perforation  Throat- Mallampati III , mucosa clear , drainage- none, tonsils- atrophic Neck- flexible , trachea midline, no stridor , thyroid nl, carotid no bruit Chest - symmetrical excursion , unlabored           Heart/CV- RRR , no murmur , no gallop  , no rub, nl s1 s2                           - JVD- none , edema- none, stasis changes- none, varices- none           Lung- clear to P&A, wheeze- none, cough- none , dullness-none, rub- none           Chest wall-  Abd-  Br/ Gen/ Rectal- Not done, not indicated Extrem- cyanosis- none, clubbing, none, atrophy- none, strength- nl Neuro- grossly intact to observation

## 2020-11-13 NOTE — Assessment & Plan Note (Signed)
He denies recent events or problems. Followed by Cardiology

## 2020-11-13 NOTE — Patient Instructions (Signed)
We can continue CPAP auto 5-20  Your CPAP will do best to help your sleep and protect your heart and brain if you make a real effort to use it every night.  Please call if we can help

## 2020-11-13 NOTE — Assessment & Plan Note (Signed)
He might actually be a candidate for [facility], but not interested at this time. Emphasized value to him of wearing CPAP every night. He insists it is comfortable. Reviewed medical purpose and goals.  Plan- continue CPAP auto 5-20

## 2020-11-14 ENCOUNTER — Encounter: Payer: Self-pay | Admitting: Family Medicine

## 2020-11-14 ENCOUNTER — Ambulatory Visit: Payer: 59 | Admitting: Family Medicine

## 2020-11-14 ENCOUNTER — Other Ambulatory Visit: Payer: Self-pay

## 2020-11-14 VITALS — BP 134/70 | HR 59 | Temp 97.7°F | Ht 69.0 in | Wt 214.2 lb

## 2020-11-14 DIAGNOSIS — E118 Type 2 diabetes mellitus with unspecified complications: Secondary | ICD-10-CM

## 2020-11-14 DIAGNOSIS — E1165 Type 2 diabetes mellitus with hyperglycemia: Secondary | ICD-10-CM

## 2020-11-14 DIAGNOSIS — Z23 Encounter for immunization: Secondary | ICD-10-CM | POA: Diagnosis not present

## 2020-11-14 DIAGNOSIS — IMO0002 Reserved for concepts with insufficient information to code with codable children: Secondary | ICD-10-CM

## 2020-11-14 LAB — POCT GLYCOSYLATED HEMOGLOBIN (HGB A1C): Hemoglobin A1C: 8.1 % — AB (ref 4.0–5.6)

## 2020-11-14 MED ORDER — OZEMPIC (0.25 OR 0.5 MG/DOSE) 2 MG/1.5ML ~~LOC~~ SOPN: PEN_INJECTOR | SUBCUTANEOUS | 3 refills | Status: AC

## 2020-11-14 MED ORDER — OZEMPIC (0.25 OR 0.5 MG/DOSE) 2 MG/1.5ML ~~LOC~~ SOPN: 0.5000 mg | PEN_INJECTOR | SUBCUTANEOUS | 1 refills | Status: DC

## 2020-11-14 MED ORDER — SILDENAFIL CITRATE 100 MG PO TABS: 50.0000 mg | ORAL_TABLET | Freq: Every day | ORAL | 3 refills | Status: DC | PRN

## 2020-11-14 NOTE — Progress Notes (Signed)
  Patient ID: [name], male    DOB: 05/06/1956, 64 y.o.   MRN: [id number]  This visit was conducted in person.  BP 134/70   Pulse (!) 59   Temp 97.7 F (36.5 C) (Temporal)   Ht 5' 9" (1.753 m)   Wt 214 lb 4 oz (97.2 kg)   SpO2 95%   BMI 31.64 kg/m    CC: 6 mo DM f/u visit  Subjective:   HPI: [name] is a 64 y.o. male presenting on 11/14/2020 for Diabetes (Here for 6 mo f/u.)   Lost brother to COVID, lost nephew to opiate use this year.   DM - does regularly check random sugars usually at bedtime - yesterday 167. Compliant with antihyperglycemic regimen which includes: jardiance 25mg daily, metformin XR 500mg bid - tolerates extended release better. Attributes high sugars to recent family illness stressors. Denies low sugars or hypoglycemic symptoms. Denies paresthesias. Last diabetic eye exam DUE. Pneumovax: 2017. Prevnar: not due. Glucometer brand: onetouch. DSME: never returned calls to schedule (2021). May be interested in weekly GLP1RA Lab Results  Component Value Date   HGBA1C 8.1 (A) 11/14/2020   Diabetic Foot Exam - Simple   Simple Foot Form Diabetic Foot exam was performed with the following findings: Yes 11/14/2020 11:56 AM  Visual Inspection No deformities, no ulcerations, no other skin breakdown bilaterally: Yes Sensation Testing Intact to touch and monofilament testing bilaterally: Yes Pulse Check Posterior Tibialis and Dorsalis pulse intact bilaterally: Yes Comments Elongated nails    Lab Results  Component Value Date   MICROALBUR 0.7 04/20/2020    Notes weight loss since starting jardiance.      Relevant past medical, surgical, family and social history reviewed and updated as indicated. Interim medical history since our last visit reviewed. Allergies and medications reviewed and updated. Outpatient Medications Prior to Visit  Medication Sig Dispense Refill  . aspirin EC 81 MG tablet Take 1 tablet (81 mg total) by mouth daily. 90 tablet 3   . atorvastatin (LIPITOR) 20 MG tablet TAKE 1 TABLET BY MOUTH EVERY DAY 90 tablet 3  . Blood Glucose Monitoring Suppl (ONE TOUCH ULTRA 2) w/Device KIT Use as directed E11.65 1 kit 0  . empagliflozin (JARDIANCE) 25 MG TABS tablet Take 1 tablet (25 mg total) by mouth daily. 90 tablet 3  . metFORMIN (GLUCOPHAGE-XR) 500 MG 24 hr tablet Take 1 tablet (500 mg total) by mouth in the morning and at bedtime. 180 tablet 3  . NEXIUM 40 MG capsule Take 1 capsule (40 mg total) by mouth daily at 12 noon. 90 capsule 3  . nitroGLYCERIN (NITROSTAT) 0.4 MG SL tablet Place 1 tablet (0.4 mg total) under the tongue every 5 (five) minutes as needed for chest pain. 25 tablet 1  . OneTouch Delica Lancets 33G MISC 1 each by Other route 2 (two) times daily as needed. 100 each 3  . ONETOUCH ULTRA test strip USE AS INSTRUCTED 100 strip 3  . VIAGRA 100 MG tablet Take 0.5 tablets (50 mg total) by mouth daily as needed for erectile dysfunction. 30 tablet 3   No facility-administered medications prior to visit.     Per HPI unless specifically indicated in ROS section below Review of Systems Objective:  BP 134/70   Pulse (!) 59   Temp 97.7 F (36.5 C) (Temporal)   Ht 5' 9" (1.753 m)   Wt 214 lb 4 oz (97.2 kg)   SpO2 95%   BMI 31.64 kg/m     Wt Readings from Last 3 Encounters:  11/14/20 214 lb 4 oz (97.2 kg)  11/13/20 217 lb 3.2 oz (98.5 kg)  06/18/20 211 lb 4 oz (95.8 kg)      Physical Exam Vitals and nursing note reviewed.  Constitutional:      General: He is not in acute distress.    Appearance: Normal appearance. He is well-developed and well-nourished. He is not ill-appearing.  HENT:     Mouth/Throat:     Mouth: Oropharynx is clear and moist.  Eyes:     General: No scleral icterus.    Extraocular Movements: Extraocular movements intact and EOM normal.     Conjunctiva/sclera: Conjunctivae normal.     Pupils: Pupils are equal, round, and reactive to light.  Cardiovascular:     Rate and Rhythm: Normal  rate and regular rhythm.     Pulses: Normal pulses and intact distal pulses.     Heart sounds: Normal heart sounds. No murmur heard.   Pulmonary:     Effort: Pulmonary effort is normal. No respiratory distress.     Breath sounds: Normal breath sounds. No wheezing, rhonchi or rales.  Musculoskeletal:        General: No edema.     Cervical back: Normal range of motion and neck supple.     Right lower leg: No edema.     Left lower leg: No edema.     Comments: See HPI for foot exam if done  Lymphadenopathy:     Cervical: No cervical adenopathy.  Skin:    General: Skin is warm and dry.     Findings: No rash.  Neurological:     Mental Status: He is alert.  Psychiatric:        Mood and Affect: Mood and affect and mood normal.        Behavior: Behavior normal.       Results for orders placed or performed in visit on 11/14/20  POCT glycosylated hemoglobin (Hb A1C)  Result Value Ref Range   Hemoglobin A1C 8.1 (A) 4.0 - 5.6 %   HbA1c POC (<> result, manual entry)     HbA1c, POC (prediabetic range)     HbA1c, POC (controlled diabetic range)     Assessment & Plan:  This visit occurred during the SARS-CoV-2 public health emergency.  Safety protocols were in place, including screening questions prior to the visit, additional usage of staff PPE, and extensive cleaning of exam room while observing appropriate contact time as indicated for disinfecting solutions.   Problem List Items Addressed This Visit    Diabetes mellitus type 2, uncontrolled, with complications ([place]) - Primary    Chronic, deteriorated. Discussed addition of weekly injectable GLP1 RA. Reviewed mechanism of action and side effects. Will price out ozempic, continue jardiance and metformin XR. He will check on family history of thyroid cancer (?mother) and what type. No symptoms of UTI, yeast infection, groin cellulitis.  RTC 3 mo DM f/u visit       Relevant Medications   Semaglutide,0.25 or 0.5MG/DOS, (OZEMPIC, 0.25 OR  0.5 MG/DOSE,) 2 MG/1.5ML SOPN   Other Relevant Orders   POCT glycosylated hemoglobin (Hb A1C) (Completed)    Other Visit Diagnoses    Need for shingles vaccine       Relevant Orders   Varicella-zoster vaccine IM (Completed)       Meds ordered this encounter  Medications  . DISCONTD: Semaglutide,0.25 or 0.5MG/DOS, (OZEMPIC, 0.25 OR 0.5 MG/DOSE,) 2 MG/1.5ML SOPN    Sig:  Inject 0.5 mg into the skin once a week.    Dispense:  1.5 mL    Refill:  1    To price out  . sildenafil (VIAGRA) 100 MG tablet    Sig: Take 0.5 tablets (50 mg total) by mouth daily as needed for erectile dysfunction.    Dispense:  30 tablet    Refill:  3  . Semaglutide,0.25 or 0.5MG/DOS, (OZEMPIC, 0.25 OR 0.5 MG/DOSE,) 2 MG/1.5ML SOPN    Sig: Inject 0.25 mg into the skin once a week for 14 days, THEN 0.5 mg once a week.    Dispense:  1.5 mL    Refill:  3    To price out - use this sig   Orders Placed This Encounter  Procedures  . Varicella-zoster vaccine IM  . POCT glycosylated hemoglobin (Hb A1C)    Patient Instructions  First shingrix vaccine today. Schedule nurse visit in 2-6 months for second vaccine.   Reschedule eye exam and colonoscopy.  Sugars were worse this year - likely related to recent stressors.  Check fasting sugars every once in a while.  Price out ozempic weekly shot start at 0.25mg weekly for 2 weeks then increase to 0.5mg weekly. Before starting, ensure no family history of medulary thyroid cancer. Return in 3 months for diabetes check.   Follow up plan: Return in about 3 months (around 02/14/2021) for follow up visit.  Javier Gutierrez, MD   

## 2020-11-14 NOTE — Assessment & Plan Note (Addendum)
Chronic, deteriorated. Discussed addition of weekly injectable GLP1 RA. Reviewed mechanism of action and side effects. Will price out ozempic, continue jardiance and metformin XR. He will check on family history of thyroid cancer (?mother) and what type. No symptoms of UTI, yeast infection, groin cellulitis.  RTC 3 mo DM f/u visit

## 2020-11-14 NOTE — Patient Instructions (Addendum)
First shingrix vaccine today. Schedule nurse visit in 2-6 months for second vaccine.   Reschedule eye exam and colonoscopy.  Sugars were worse this year - likely related to recent stressors.  Check fasting sugars every once in a while.  Price out ozempic weekly shot start at 0.25mg  weekly for 2 weeks then increase to 0.5mg  weekly. Before starting, ensure no family history of medulary thyroid cancer. Return in 3 months for diabetes check.

## 2021-02-15 ENCOUNTER — Encounter: Payer: Self-pay | Admitting: Family Medicine

## 2021-02-15 ENCOUNTER — Telehealth (INDEPENDENT_AMBULATORY_CARE_PROVIDER_SITE_OTHER): Payer: 59 | Admitting: Family Medicine

## 2021-02-15 ENCOUNTER — Other Ambulatory Visit (INDEPENDENT_AMBULATORY_CARE_PROVIDER_SITE_OTHER): Payer: 59

## 2021-02-15 VITALS — BP 136/85 | HR 79 | Temp 97.7°F | Ht 69.0 in | Wt 200.0 lb

## 2021-02-15 DIAGNOSIS — E669 Obesity, unspecified: Secondary | ICD-10-CM | POA: Diagnosis not present

## 2021-02-15 DIAGNOSIS — I2510 Atherosclerotic heart disease of native coronary artery without angina pectoris: Secondary | ICD-10-CM

## 2021-02-15 DIAGNOSIS — E118 Type 2 diabetes mellitus with unspecified complications: Secondary | ICD-10-CM

## 2021-02-15 DIAGNOSIS — I10 Essential (primary) hypertension: Secondary | ICD-10-CM | POA: Diagnosis not present

## 2021-02-15 DIAGNOSIS — U071 COVID-19: Secondary | ICD-10-CM | POA: Diagnosis not present

## 2021-02-15 DIAGNOSIS — E1165 Type 2 diabetes mellitus with hyperglycemia: Secondary | ICD-10-CM

## 2021-02-15 DIAGNOSIS — IMO0002 Reserved for concepts with insufficient information to code with codable children: Secondary | ICD-10-CM

## 2021-02-15 HISTORY — DX: COVID-19: U07.1

## 2021-02-15 LAB — BASIC METABOLIC PANEL
BUN: 11 mg/dL (ref 7–25)
CO2: 24 mmol/L (ref 20–32)
Calcium: 9.3 mg/dL (ref 8.6–10.3)
Chloride: 104 mmol/L (ref 98–110)
Creat: 0.92 mg/dL (ref 0.70–1.25)
Glucose, Bld: 110 mg/dL — ABNORMAL HIGH (ref 65–99)
Potassium: 3.8 mmol/L (ref 3.5–5.3)
Sodium: 138 mmol/L (ref 135–146)

## 2021-02-15 MED ORDER — NIRMATRELVIR/RITONAVIR (PAXLOVID)TABLET: 3.0000 | ORAL_TABLET | Freq: Two times a day (BID) | ORAL | 0 refills | Status: AC

## 2021-02-15 NOTE — Progress Notes (Signed)
Patient ID: [name], male    DOB: [birth year], 65 y.o.   MRN: [id number]  Virtual visit completed through [place], a video enabled telemedicine application. Due to national recommendations of social distancing due to COVID-19, a virtual visit is felt to be most appropriate for this patient at this time. Reviewed limitations, risks, security and privacy concerns of performing a virtual visit and the availability of in person appointments. I also reviewed that there may be a patient responsible charge related to this service. The patient agreed to proceed.   Patient location: home Provider location: [place] [facility], office Persons participating in this virtual visit: patient, provider, wife  If any vitals were documented, they were collected by patient at home unless specified below.    BP 136/85   Pulse 79   Temp 97.7 F (36.5 C)   Ht '5\' 9"'  (1.753 m)   Wt 200 lb (90.7 kg)   SpO2 94%   BMI 29.53 kg/m    CC: COVID positive Subjective:   HPI: [name] is a 65 y.o. male presenting on 02/15/2021 for Shortness of Breath (C/o SOB, diarrhea, loss of appetite, HA and fever- max 99.0.  Sxs started 02/13/21.  Home COVID test results yesterday, pos. )   First day of symptoms: 6/1 Tested COVID positive: 6/2  Current symptoms: diarrhea, nausea, loss of appetite, HA, low grade fever, exertional dyspnea, ST.  No: cough, abd pain, loss of taste/smell, body aches, or hoarseness  Treatments to date: advil, tylenol  Risk factors include: diabetes, obesity, hypertension, CAD  COVID vaccination status: none      Relevant past medical, surgical, family and social history reviewed and updated as indicated. Interim medical history since our last visit reviewed. Allergies and medications reviewed and updated. Outpatient Medications Prior to Visit  Medication Sig Dispense Refill  . aspirin EC 81 MG tablet Take 1 tablet (81 mg total) by mouth daily. 90 tablet 3  . atorvastatin  (LIPITOR) 20 MG tablet TAKE 1 TABLET BY MOUTH EVERY DAY 90 tablet 3  . Blood Glucose Monitoring Suppl (ONE TOUCH ULTRA 2) w/Device KIT Use as directed E11.65 1 kit 0  . empagliflozin (JARDIANCE) 25 MG TABS tablet Take 1 tablet (25 mg total) by mouth daily. 90 tablet 3  . metFORMIN (GLUCOPHAGE-XR) 500 MG 24 hr tablet Take 1 tablet (500 mg total) by mouth in the morning and at bedtime. 180 tablet 3  . NEXIUM 40 MG capsule Take 1 capsule (40 mg total) by mouth daily at 12 noon. 90 capsule 3  . nitroGLYCERIN (NITROSTAT) 0.4 MG SL tablet Place 1 tablet (0.4 mg total) under the tongue every 5 (five) minutes as needed for chest pain. 25 tablet 1  . OneTouch Delica Lancets 79G MISC 1 each by Other route 2 (two) times daily as needed. 100 each 3  . ONETOUCH ULTRA test strip USE AS INSTRUCTED 100 strip 3  . sildenafil (VIAGRA) 100 MG tablet Take 0.5 tablets (50 mg total) by mouth daily as needed for erectile dysfunction. 30 tablet 3   No facility-administered medications prior to visit.     Per HPI unless specifically indicated in ROS section below Review of Systems Objective:  BP 136/85   Pulse 79   Temp 97.7 F (36.5 C)   Ht '5\' 9"'  (1.753 m)   Wt 200 lb (90.7 kg)   SpO2 94%   BMI 29.53 kg/m   Wt Readings from Last 3 Encounters:  02/15/21 200 lb (  90.7 kg)  11/14/20 214 lb 4 oz (97.2 kg)  11/13/20 217 lb 3.2 oz (98.5 kg)       Physical exam: Gen: alert, NAD, not ill appearing Pulm: speaks in complete sentences without increased work of breathing Psych: normal mood, normal thought content      Results for orders placed or performed in visit on 11/14/20  POCT glycosylated hemoglobin (Hb A1C)  Result Value Ref Range   Hemoglobin A1C 8.1 (A) 4.0 - 5.6 %   HbA1c POC (<> result, manual entry)     HbA1c, POC (prediabetic range)     HbA1c, POC (controlled diabetic range)     Lab Results  Component Value Date   CREATININE 0.88 04/20/2020   BUN 14 04/20/2020   NA 136 04/20/2020   K 4.3  04/20/2020   CL 102 04/20/2020   CO2 28 04/20/2020   GFR = 87 (04/2020)  Assessment & Plan:   Problem List Items Addressed This Visit    Diabetes mellitus type 2, uncontrolled, with complications ([place])   Obesity, Class I, BMI 30.0-34.9 (see actual BMI)   Essential hypertension   Coronary artery disease involving native coronary artery of native heart without angina pectoris   COVID-19 virus infection - Primary    Recommend: full dose paxlovid. Drug interactions:  1. Lipitor - hold while on paxlovid 2. Nexium - hold while on paxlovid 3. Sildenafil - hold while on paxlovid As no recent renal function, I have asked her to come in this afternoon for curbside lab visit for kidney check.  Discussed anticipated course of recovery as well as red flags to seek urgent in-person care. Reviewed latest CDC isolation/quarantining guidelines.  Encouraged fluids and rest. Reviewed further supportive care measures at home including vit C, vit D, zinc, tylenol PRN.       Relevant Medications   nirmatrelvir/ritonavir EUA (PAXLOVID) TABS   Other Relevant Orders   Basic metabolic panel       Meds ordered this encounter  Medications  . nirmatrelvir/ritonavir EUA (PAXLOVID) TABS    Sig: Take 3 tablets by mouth 2 (two) times daily for 5 days. (Take nirmatrelvir 150 mg two tablets twice daily for 5 days and ritonavir 100 mg one tablet twice daily for 5 days) Patient GFR is 87    Dispense:  30 tablet    Refill:  0   Orders Placed This Encounter  Procedures  . Basic metabolic panel    Standing Status:   Future    Standing Expiration Date:   02/15/2022    I discussed the assessment and treatment plan with the patient. The patient was provided an opportunity to ask questions and all were answered. The patient agreed with the plan and demonstrated an understanding of the instructions. The patient was advised to call back or seek an in-person evaluation if the symptoms worsen or if the condition fails to  improve as anticipated.  Follow up plan: No follow-ups on file.  Ria Bush, MD

## 2021-02-15 NOTE — Assessment & Plan Note (Addendum)
Recommend: full dose paxlovid. Drug interactions:  1. Lipitor - hold while on paxlovid 2. Nexium - hold while on paxlovid 3. Sildenafil - hold while on paxlovid As no recent renal function, I have asked her to come in this afternoon for curbside lab visit for kidney check.  Discussed anticipated course of recovery as well as red flags to seek urgent in-person care. Reviewed latest CDC isolation/quarantining guidelines.  Encouraged fluids and rest. Reviewed further supportive care measures at home including vit C, vit D, zinc, tylenol PRN.

## 2021-02-15 NOTE — Addendum Note (Signed)
Addended by: Cloyd Stagers on: 02/15/2021 02:41 PM   Modules accepted: Orders

## 2021-02-18 ENCOUNTER — Ambulatory Visit: Payer: 59 | Admitting: Family Medicine

## 2021-03-20 ENCOUNTER — Ambulatory Visit: Payer: 59 | Admitting: Family Medicine

## 2021-03-20 ENCOUNTER — Encounter: Payer: Self-pay | Admitting: Family Medicine

## 2021-03-20 ENCOUNTER — Other Ambulatory Visit: Payer: Self-pay

## 2021-03-20 VITALS — BP 118/68 | HR 53 | Temp 97.7°F | Ht 69.0 in | Wt 210.2 lb

## 2021-03-20 DIAGNOSIS — E669 Obesity, unspecified: Secondary | ICD-10-CM

## 2021-03-20 DIAGNOSIS — Z23 Encounter for immunization: Secondary | ICD-10-CM | POA: Diagnosis not present

## 2021-03-20 DIAGNOSIS — E1165 Type 2 diabetes mellitus with hyperglycemia: Secondary | ICD-10-CM | POA: Diagnosis not present

## 2021-03-20 DIAGNOSIS — IMO0002 Reserved for concepts with insufficient information to code with codable children: Secondary | ICD-10-CM

## 2021-03-20 DIAGNOSIS — E118 Type 2 diabetes mellitus with unspecified complications: Secondary | ICD-10-CM

## 2021-03-20 LAB — POCT GLYCOSYLATED HEMOGLOBIN (HGB A1C): Hemoglobin A1C: 7.3 % — AB (ref 4.0–5.6)

## 2021-03-20 NOTE — Assessment & Plan Note (Signed)
Discussed weight gain noted.  

## 2021-03-20 NOTE — Patient Instructions (Addendum)
Second shingrix vaccine today  Reschedule eye doctor appointment, send me report when complete to update your chart.   A1c was better - continue efforts towards low sugar low carb diabetic diet.  Return in 3 months for physical

## 2021-03-20 NOTE — Progress Notes (Signed)
Patient ID: [name], male    DOB: 20-Oct-1955, 65 y.o.   MRN: [id number]  This visit was conducted in person.  BP 118/68   Pulse (!) 53   Temp 97.7 F (36.5 C) (Temporal)   Ht '5\' 9"'  (1.753 m)   Wt 210 lb 3 oz (95.3 kg)   SpO2 96%   BMI 31.04 kg/m    CC: DM 3 mo f/u visit  Subjective:   HPI: [name] is a 65 y.o. male presenting on 03/20/2021 for Diabetes (Here for 3 mo f/u.)   DM - does regularly check sugars 200s, 150 this morning. Higher readings since COVID infection last month. Compliant with antihyperglycemic regimen which includes: jardiance 34m daily, metformin XR 5021mBID. Denies low sugars or hypoglycemic symptoms. Denies paresthesias. Last diabetic eye exam DUE. Pneumovax: 2017. Prevnar: DUE. Glucometer brand: one touch. DSME: did not complete.  Lab Results  Component Value Date   HGBA1C 7.3 (A) 03/20/2021   Diabetic Foot Exam - Simple   No data filed    Lab Results  Component Value Date   MICROALBUR 0.7 04/20/2020       Relevant past medical, surgical, family and social history reviewed and updated as indicated. Interim medical history since our last visit reviewed. Allergies and medications reviewed and updated. Outpatient Medications Prior to Visit  Medication Sig Dispense Refill   aspirin EC 81 MG tablet Take 1 tablet (81 mg total) by mouth daily. 90 tablet 3   atorvastatin (LIPITOR) 20 MG tablet TAKE 1 TABLET BY MOUTH EVERY DAY 90 tablet 3   Blood Glucose Monitoring Suppl (ONE TOUCH ULTRA 2) w/Device KIT Use as directed E11.65 1 kit 0   empagliflozin (JARDIANCE) 25 MG TABS tablet Take 1 tablet (25 mg total) by mouth daily. 90 tablet 3   metFORMIN (GLUCOPHAGE-XR) 500 MG 24 hr tablet Take 1 tablet (500 mg total) by mouth in the morning and at bedtime. 180 tablet 3   NEXIUM 40 MG capsule Take 1 capsule (40 mg total) by mouth daily at 12 noon. 90 capsule 3   nitroGLYCERIN (NITROSTAT) 0.4 MG SL tablet Place 1 tablet (0.4 mg total) under the  tongue every 5 (five) minutes as needed for chest pain. 25 tablet 1   OneTouch Delica Lancets 3334KISC 1 each by Other route 2 (two) times daily as needed. 100 each 3   ONETOUCH ULTRA test strip USE AS INSTRUCTED 100 strip 3   sildenafil (VIAGRA) 100 MG tablet Take 0.5 tablets (50 mg total) by mouth daily as needed for erectile dysfunction. 30 tablet 3   No facility-administered medications prior to visit.     Per HPI unless specifically indicated in ROS section below Review of Systems  Objective:  BP 118/68   Pulse (!) 53   Temp 97.7 F (36.5 C) (Temporal)   Ht '5\' 9"'  (1.753 m)   Wt 210 lb 3 oz (95.3 kg)   SpO2 96%   BMI 31.04 kg/m   Wt Readings from Last 3 Encounters:  03/20/21 210 lb 3 oz (95.3 kg)  02/15/21 200 lb (90.7 kg)  11/14/20 214 lb 4 oz (97.2 kg)      Physical Exam Vitals and nursing note reviewed.  Constitutional:      Appearance: Normal appearance. He is not ill-appearing.  Eyes:     Extraocular Movements: Extraocular movements intact.     Conjunctiva/sclera: Conjunctivae normal.     Pupils: Pupils are equal, round, and reactive to  light.  Cardiovascular:     Rate and Rhythm: Normal rate and regular rhythm.     Pulses: Normal pulses.     Heart sounds: Normal heart sounds. No murmur heard. Pulmonary:     Effort: Pulmonary effort is normal. No respiratory distress.     Breath sounds: Normal breath sounds. No wheezing, rhonchi or rales.  Musculoskeletal:     Right lower leg: No edema.     Left lower leg: No edema.     Comments: See HPI for foot exam if done  Skin:    General: Skin is warm and dry.     Findings: No rash.  Neurological:     Mental Status: He is alert.  Psychiatric:        Mood and Affect: Mood normal.        Behavior: Behavior normal.      Results for orders placed or performed in visit on 03/20/21  POCT glycosylated hemoglobin (Hb A1C)  Result Value Ref Range   Hemoglobin A1C 7.3 (A) 4.0 - 5.6 %   HbA1c POC (<> result, manual  entry)     HbA1c, POC (prediabetic range)     HbA1c, POC (controlled diabetic range)      Assessment & Plan:  This visit occurred during the SARS-CoV-2 public health emergency.  Safety protocols were in place, including screening questions prior to the visit, additional usage of staff PPE, and extensive cleaning of exam room while observing appropriate contact time as indicated for disinfecting solutions.   Problem List Items Addressed This Visit     Diabetes mellitus type 2, uncontrolled, with complications ([place]) - Primary    Chronic, improving control noted based on recent A1c - continue current regimen of metformin XR BID and jardiance. Renew efforts to follow diabetic diet.        Relevant Orders   POCT glycosylated hemoglobin (Hb A1C) (Completed)   Obesity, Class I, BMI 30.0-34.9 (see actual BMI)    Discussed weight gain noted.        Other Visit Diagnoses     Need for shingles vaccine       Relevant Orders   Varicella-zoster vaccine IM (Completed)        No orders of the defined types were placed in this encounter.  Orders Placed This Encounter  Procedures   Varicella-zoster vaccine IM   POCT glycosylated hemoglobin (Hb A1C)    Patient Instructions  Second shingrix vaccine today  Reschedule eye doctor appointment, send me report when complete to update your chart.   A1c was better - continue efforts towards low sugar low carb diabetic diet.  Return in 3 months for physical   Follow up plan: Return in about 3 months (around 06/20/2021) for annual exam, prior fasting for blood work, medicare wellness visit.  Ria Bush, MD

## 2021-03-20 NOTE — Assessment & Plan Note (Signed)
Chronic, improving control noted based on recent A1c - continue current regimen of metformin XR BID and jardiance. Renew efforts to follow diabetic diet.

## 2021-04-07 ENCOUNTER — Other Ambulatory Visit: Payer: Self-pay | Admitting: Family Medicine

## 2021-04-25 ENCOUNTER — Other Ambulatory Visit: Payer: Self-pay | Admitting: Family Medicine

## 2021-06-03 ENCOUNTER — Other Ambulatory Visit: Payer: Self-pay | Admitting: Family Medicine

## 2021-06-16 ENCOUNTER — Other Ambulatory Visit: Payer: Self-pay | Admitting: Family Medicine

## 2021-06-16 DIAGNOSIS — E1169 Type 2 diabetes mellitus with other specified complication: Secondary | ICD-10-CM

## 2021-06-16 DIAGNOSIS — Z125 Encounter for screening for malignant neoplasm of prostate: Secondary | ICD-10-CM

## 2021-06-16 DIAGNOSIS — E118 Type 2 diabetes mellitus with unspecified complications: Secondary | ICD-10-CM

## 2021-06-19 ENCOUNTER — Other Ambulatory Visit: Payer: 59

## 2021-06-20 ENCOUNTER — Ambulatory Visit: Payer: 59

## 2021-06-21 LAB — HM DIABETES EYE EXAM

## 2021-06-24 ENCOUNTER — Encounter: Payer: Self-pay | Admitting: Family Medicine

## 2021-06-26 ENCOUNTER — Encounter: Payer: 59 | Admitting: Family Medicine

## 2021-07-07 ENCOUNTER — Telehealth: Payer: Self-pay | Admitting: Family Medicine

## 2021-07-08 ENCOUNTER — Other Ambulatory Visit: Payer: Self-pay | Admitting: Family Medicine

## 2021-07-09 NOTE — Telephone Encounter (Signed)
Please call patient and schedule annual physical. 

## 2021-07-10 ENCOUNTER — Encounter: Payer: Self-pay | Admitting: Family Medicine

## 2021-07-10 NOTE — Telephone Encounter (Signed)
CALLED PATIENT AND LEFT VM

## 2021-07-11 NOTE — Telephone Encounter (Signed)
Called patient and got him scheduled for 12/28

## 2021-07-22 ENCOUNTER — Other Ambulatory Visit: Payer: Self-pay | Admitting: Family Medicine

## 2021-08-01 ENCOUNTER — Other Ambulatory Visit: Payer: Self-pay | Admitting: Family Medicine

## 2021-08-16 ENCOUNTER — Ambulatory Visit: Payer: 59 | Admitting: Internal Medicine

## 2021-08-16 ENCOUNTER — Encounter: Payer: Self-pay | Admitting: Internal Medicine

## 2021-08-16 ENCOUNTER — Other Ambulatory Visit: Payer: Self-pay

## 2021-08-16 VITALS — BP 126/70 | HR 58 | Ht 69.0 in | Wt 221.0 lb

## 2021-08-16 DIAGNOSIS — I2510 Atherosclerotic heart disease of native coronary artery without angina pectoris: Secondary | ICD-10-CM | POA: Diagnosis not present

## 2021-08-16 DIAGNOSIS — E1169 Type 2 diabetes mellitus with other specified complication: Secondary | ICD-10-CM

## 2021-08-16 DIAGNOSIS — E785 Hyperlipidemia, unspecified: Secondary | ICD-10-CM | POA: Diagnosis not present

## 2021-08-16 DIAGNOSIS — N529 Male erectile dysfunction, unspecified: Secondary | ICD-10-CM

## 2021-08-16 MED ORDER — NITROGLYCERIN 0.4 MG SL SUBL: 0.4000 mg | SUBLINGUAL_TABLET | SUBLINGUAL | 3 refills | Status: DC | PRN

## 2021-08-16 NOTE — Progress Notes (Signed)
Follow-up Outpatient Visit Date: 08/16/2021  Primary Care Provider: Eustaquio Boyden, MD [address] [place] [address]  Chief Complaint: Follow-up coronary artery disease  HPI:  Mr. Durkee is a 65 y.o. male with history of coronary artery disease status post bare-metal stents to the LAD and RCA in 2002 as well as angioplasty to the LCx, hyperlipidemia, GERD, and obstructive sleep apnea on CPAP, who presents for follow-up of coronary artery disease.  I last saw him a year ago, at which time Mr. Mcdiarmid was feeling well other than experiencing cold feet as well as occasional leg cramps.  Pedal pulses were noted to be brisk; we agreed to defer further vascular testing.  No medication changes were made.  Today, Mr. Beilke reports that he has been feeling fairly well.  He has stable exertional dyspnea that has been present for more than a year.  He denies chest pain, palpitations, lightheadedness, and edema.  He walks some with his wife and also stays busy with activities around the house.  His feet remain cold at times, similar to last year.  He recently was started on sildenafil for erectile dysfunction by Dr. Sharen Hones and wonders if it is safe for him to use this medication from a heart standpoint.  --------------------------------------------------------------------------------------------------  Cardiovascular History & Procedures: Cardiovascular Problems: CAD s/p PCI & PTCA   Risk Factors: Known CAD, hypertension, hyperlipidemia, male gender, and age > 65   Cath/PCI: PCI (05/05/01): PCI to mid RCA with NIR 3.0 x 13 mm BMS (post-dilated with 3.25 mm balloon). POBA to mid LCx with 2.25 mm cutting balloon. PCI (05/03/01): PCI to proximal LAD CTO with Pixel 2.5 x 23 mm BMS (post-dilated with 3.0 mm balloon); POBA to mid LAD stenosis with 2.25 mm balloon LHC (04/30/01): LMCA normal. LAD with CTO of proximal vessel.  LCx with large OM1 followed by 90% stenosis in small AV groove  LCx.  Dominant RCA with 90% midvessel stenosis.  Preserved LVEF with mid/apical anterior akinesis.   CV Surgery: None   EP Procedures and Devices: None    Non-Invasive Evaluation(s): TTE (12/13/2019): Borderline LV enlargement and hypertrophy with LVEF 55-60%.  Normal diastolic function and wall motion.  Normal RV size and function.  Mild right atrial enlargement.  Mild mitral regurgitation. Exercise myocardial perfusion stress test (12/23/05): Fair exercise capacity without evidence of ischemia or scar. LVEF 53%.  Recent CV Pertinent Labs: Lab Results  Component Value Date   CHOL 114 04/20/2020   CHOL 101 10/20/2016   HDL 33.60 (L) 04/20/2020   HDL 36 (L) 10/20/2016   LDLCALC 50 04/20/2020   LDLCALC 48 10/20/2016   LDLDIRECT 70.8 06/30/2012   TRIG 151.0 (H) 04/20/2020   CHOLHDL 3 04/20/2020   K 3.8 02/15/2021   MG 1.9 10/20/2016   BUN 11 02/15/2021   BUN 13 10/20/2016   CREATININE 0.92 02/15/2021    Past medical and surgical history were reviewed and updated in EPIC.  Current Meds  Medication Sig   aspirin EC 81 MG tablet Take 1 tablet (81 mg total) by mouth daily.   atorvastatin (LIPITOR) 20 MG tablet TAKE 1 TABLET BY MOUTH EVERY DAY   Blood Glucose Monitoring Suppl (ONE TOUCH ULTRA 2) w/Device KIT Use as directed E11.65   JARDIANCE 25 MG TABS tablet TAKE 1 TABLET BY MOUTH EVERY DAY   metFORMIN (GLUCOPHAGE-XR) 500 MG 24 hr tablet TAKE 1 TABLET (500 MG TOTAL) BY MOUTH IN THE MORNING AND AT BEDTIME.   NEXIUM 40  MG capsule TAKE 1 CAPSULE (40 MG TOTAL) BY MOUTH DAILY AT 12 NOON.   nitroGLYCERIN (NITROSTAT) 0.4 MG SL tablet Place 1 tablet (0.4 mg total) under the tongue every 5 (five) minutes as needed for chest pain.   OneTouch Delica Lancets 81E MISC 1 each by Other route 2 (two) times daily as needed.   ONETOUCH ULTRA test strip USE AS INSTRUCTED   sildenafil (VIAGRA) 100 MG tablet Take 0.5 tablets (50 mg total) by mouth daily as needed for erectile dysfunction.     Allergies: Patient has no known allergies.  Social History   Tobacco Use   Smoking status: Former    Packs/day: 1.00    Years: 15.00    Pack years: 15.00    Types: Cigarettes    Quit date: 09/15/2000    Years since quitting: 20.9   Smokeless tobacco: Never  Vaping Use   Vaping Use: Never used  Substance Use Topics   Alcohol use: No   Drug use: No    Family History  Problem Relation Age of Onset   Lung cancer Father 33       smoker   Hypertension Mother    Diabetes Neg Hx    Coronary artery disease Neg Hx    Stroke Neg Hx    Colon cancer Neg Hx    Esophageal cancer Neg Hx    Rectal cancer Neg Hx    Stomach cancer Neg Hx     Review of Systems: A 12-system review of systems was performed and was negative except as noted in the HPI.  --------------------------------------------------------------------------------------------------  Physical Exam: BP 126/70 (BP Location: Left Arm, Patient Position: Sitting, Cuff Size: Large)   Pulse (!) 58   Ht _0  (1.753 m)   Wt 221 lb (100.2 kg)   SpO2 96%   BMI 32.64 kg/m   General:  NAD. Neck: No JVD or HJR. Lungs: Clear to auscultation bilaterally without wheezes or crackles. Heart: Bradycardic but regular with 1/6 systolic murmur.  No rubs or gallops. Abdomen: Soft, nontender, nondistended. Extremities: No lower extremity edema.  EKG: Sinus bradycardia (heart rate 58 bpm).  Otherwise, no significant abnormality.  Lab Results  Component Value Date   WBC 9.0 08/27/2017   HGB 14.8 08/27/2017   HCT 43.4 08/27/2017   MCV 90.8 08/27/2017   PLT 209.0 08/27/2017    Lab Results  Component Value Date   NA 138 02/15/2021   K 3.8 02/15/2021   CL 104 02/15/2021   CO2 24 02/15/2021   BUN 11 02/15/2021   CREATININE 0.92 02/15/2021   GLUCOSE 110 (H) 02/15/2021   ALT 36 04/20/2020    Lab Results  Component Value Date   CHOL 114 04/20/2020   HDL 33.60 (L) 04/20/2020   LDLCALC 50 04/20/2020   LDLDIRECT 70.8  06/30/2012   TRIG 151.0 (H) 04/20/2020   CHOLHDL 3 04/20/2020    --------------------------------------------------------------------------------------------------  ASSESSMENT AND PLAN: Coronary artery disease: Mr. Ransom denies any chest pain or worsening shortness of breath to suggest progression of his ischemic heart disease.  We will continue aspirin and atorvastatin for secondary prevention.  Hyperlipidemia associated with type 2 diabetes mellitus: LDL well controlled on last check in 04/2020.  Triglycerides were borderline.  We will plan to continue atorvastatin 40 mg daily.  I will check a CMP and lipid panel today as well as hemoglobin A1c so that labs do not need to be repeated when Mr. Leeds is seen for follow-up with Dr. Danise Mina later this  month.  Erectile dysfunction: I think it is reasonable for Mr. Scantlebury to use sildenafil as needed for erectile dysfunction.  We discussed potential interactions with nitrates; I have advised him to refrain from using sublingual nitroglycerin if he has taken sildenafil within the preceding 24 hours.  Follow-up: Return to clinic in 1 year.  Nelva Bush, MD 08/16/2021 10:57 AM

## 2021-08-16 NOTE — Patient Instructions (Signed)
Medication Instructions:   Your physician recommends that you continue on your current medications as directed. Please refer to the Current Medication list given to you today.  *If you need a refill on your cardiac medications before your next appointment, please call your pharmacy*   Lab Work:  Today: CBC, CMET, Lipid panel, Hgb A1c  If you have labs (blood work) drawn today and your tests are completely normal, you will receive your results only by: [place] (if you have MyChart) OR A paper copy in the mail If you have any lab test that is abnormal or we need to change your treatment, we will call you to review the results.   Testing/Procedures:  None ordered   Follow-Up: At [facility], you and your health needs are our priority.  As part of our continuing mission to provide you with exceptional heart care, we have created designated Provider Care Teams.  These Care Teams include your primary Cardiologist (physician) and Advanced Practice Providers (APPs -  Physician Assistants and Nurse Practitioners) who all work together to provide you with the care you need, when you need it.  We recommend signing up for the patient portal called "MyChart".  Sign up information is provided on this After Visit Summary.  MyChart is used to connect with patients for Virtual Visits (Telemedicine).  Patients are able to view lab/test results, encounter notes, upcoming appointments, etc.  Non-urgent messages can be sent to your provider as well.   To learn more about what you can do with MyChart, go to [email or web address].    Your next appointment:   1 year(s)  The format for your next appointment:   In Person  Provider:   You may see Nelva Bush, MD or one of the following Advanced Practice Providers on your designated Care Team:   Murray Hodgkins, NP Christell Faith, PA-C Cadence Kathlen Mody, [place]

## 2021-08-17 LAB — COMPREHENSIVE METABOLIC PANEL
ALT: 36 IU/L (ref 0–44)
AST: 30 IU/L (ref 0–40)
Albumin/Globulin Ratio: 1.6 (ref 1.2–2.2)
Albumin: 4.6 g/dL (ref 3.8–4.8)
Alkaline Phosphatase: 71 IU/L (ref 44–121)
BUN/Creatinine Ratio: 12 (ref 10–24)
BUN: 11 mg/dL (ref 8–27)
Bilirubin Total: 0.7 mg/dL (ref 0.0–1.2)
CO2: 22 mmol/L (ref 20–29)
Calcium: 9.4 mg/dL (ref 8.6–10.2)
Chloride: 102 mmol/L (ref 96–106)
Creatinine, Ser: 0.94 mg/dL (ref 0.76–1.27)
Globulin, Total: 2.8 g/dL (ref 1.5–4.5)
Glucose: 152 mg/dL — ABNORMAL HIGH (ref 70–99)
Potassium: 4.5 mmol/L (ref 3.5–5.2)
Sodium: 139 mmol/L (ref 134–144)
Total Protein: 7.4 g/dL (ref 6.0–8.5)
eGFR: 91 mL/min/{1.73_m2} (ref >59)

## 2021-08-17 LAB — LIPID PANEL
Chol/HDL Ratio: 3.6 ratio (ref 0.0–5.0)
Cholesterol, Total: 113 mg/dL (ref 100–199)
HDL: 31 mg/dL — ABNORMAL LOW (ref >39)
LDL Chol Calc (NIH): 59 mg/dL (ref 0–99)
Triglycerides: 127 mg/dL (ref 0–149)
VLDL Cholesterol Cal: 23 mg/dL (ref 5–40)

## 2021-08-17 LAB — CBC
Hematocrit: 47.3 % (ref 37.5–51.0)
Hemoglobin: 16.1 g/dL (ref 13.0–17.7)
MCH: 30.5 pg (ref 26.6–33.0)
MCHC: 34 g/dL (ref 31.5–35.7)
MCV: 90 fL (ref 79–97)
Platelets: 205 10*3/uL (ref 150–450)
RBC: 5.28 x10E6/uL (ref 4.14–5.80)
RDW: 12.2 % (ref 11.6–15.4)
WBC: 8.7 10*3/uL (ref 3.4–10.8)

## 2021-08-17 LAB — HEMOGLOBIN A1C
Est. average glucose Bld gHb Est-mCnc: 180 mg/dL
Hgb A1c MFr Bld: 7.9 % — ABNORMAL HIGH (ref 4.8–5.6)

## 2021-08-20 ENCOUNTER — Telehealth: Payer: Self-pay | Admitting: *Deleted

## 2021-08-20 NOTE — Telephone Encounter (Signed)
-----   Message from Nelva Bush, MD sent at 08/19/2021  7:21 AM EST ----- Kidney function, liver function, and electrolytes are stable.  LDL is adequately controlled.  Hemoglobin A1c is above goal and slightly higher compared to July.  I recommend that [name] continue his current medications and follow-up with Dr. Danise Mina to discuss ways to improve his blood sugar.

## 2021-08-20 NOTE — Telephone Encounter (Signed)
Left detailed message (DPR approved) notifying pt of lab results and Dr. Darnelle Bos recc.  Advised pt may call back to discuss further.

## 2021-09-08 ENCOUNTER — Other Ambulatory Visit: Payer: Self-pay | Admitting: Family Medicine

## 2021-09-11 ENCOUNTER — Other Ambulatory Visit: Payer: Self-pay | Admitting: Family Medicine

## 2021-09-12 ENCOUNTER — Encounter: Payer: Self-pay | Admitting: Family Medicine

## 2021-09-12 NOTE — Progress Notes (Signed)
Patient ID: [name], male    DOB: 1956/02/04, 65 y.o.   MRN: [id number]  This visit was conducted in person.  BP 140/76    Pulse 69    Temp 97.8 F (36.6 C) (Temporal)    Ht 5' 8.5" (1.74 m)    Wt 221 lb 5 oz (100.4 kg)    SpO2 96%    BMI 33.16 kg/m    CC: CPE Subjective:   HPI: [name] is a 65 y.o. male presenting on 09/13/2021 for Annual Exam   Planning to start medicare 11/13/2021 OSA - followed by Dr Annamaria Boots   DM - continues metformin XR 591m bid and jardiance 235mdaily. Worse control attributed to dietary liberties during the holidays.   COVID infection 02/2021 - notes some ongoing fatigue since this.   Preventative: COLONOSCOPY Date: 06/2012 15 polyps - adenomatous, mild diverticulosis, rpt 1 yr ([facility]) - overdue for rpt - advised call for appointment.  Prostate cancer screening - previously saw urology ([facility]). Would like to continue screening at our office. Nocturia x1, strong stream.  Lung cancer screening CT - not eligible Flu shot - yearly  COVID vaccine - did not receive  Tetanus 2012  Pneumovax - 2017  Shingrix - 11/2020, 03/2021  Advanced directive - discussed. Has this at home. HCPOA would be wife Nenita. Asked to bring [place] copy.  Seat belt use discussed  Sunscreen use discussed, no changing moles on skin  Ex-smoker quit 2002, prior ~ 50 PY hx Alcohol - occasional  Dentist q6 mo  Eye exam - yearly Bowel - no constipation Bladder - no incontinence  Caffeine: 1 pepsi/day Lives with wife, 1 dog, 2 cats.  Grown daughter. Occupation: [employer]: HS Activity: work 12 hour days, gardens and fishes, has started walking regularly Diet: good water daily, fruits/vegetables daily     Relevant past medical, surgical, family and social history reviewed and updated as indicated. Interim medical history since our last visit reviewed. Allergies and medications reviewed and updated. Outpatient Medications Prior to Visit  Medication Sig Dispense  Refill   aspirin EC 81 MG tablet Take 1 tablet (81 mg total) by mouth daily. 90 tablet 3   Blood Glucose Monitoring Suppl (ONE TOUCH ULTRA 2) w/Device KIT Use as directed E11.65 1 kit 0   nitroGLYCERIN (NITROSTAT) 0.4 MG SL tablet Place 1 tablet (0.4 mg total) under the tongue every 5 (five) minutes as needed for chest pain. 25 tablet 3   OneTouch Delica Lancets 3340NISC 1 each by Other route 2 (two) times daily as needed. 100 each 3   ONETOUCH ULTRA test strip USE AS INSTRUCTED 100 strip 3   atorvastatin (LIPITOR) 20 MG tablet TAKE 1 TABLET BY MOUTH EVERY DAY 30 tablet 1   JARDIANCE 25 MG TABS tablet TAKE 1 TABLET BY MOUTH EVERY DAY 90 tablet 0   metFORMIN (GLUCOPHAGE-XR) 500 MG 24 hr tablet TAKE 1 TABLET (500 MG TOTAL) BY MOUTH IN THE MORNING AND AT BEDTIME. 180 tablet 0   NEXIUM 40 MG capsule TAKE 1 CAPSULE (40 MG TOTAL) BY MOUTH DAILY AT 12 NOON. 90 capsule 0   sildenafil (VIAGRA) 100 MG tablet Take 0.5 tablets (50 mg total) by mouth daily as needed for erectile dysfunction. 30 tablet 3   atorvastatin (LIPITOR) 20 MG tablet TAKE 1 TABLET BY MOUTH EVERY DAY 30 tablet 1   No facility-administered medications prior to visit.     Per HPI unless specifically indicated in  ROS section below Review of Systems  Constitutional:  Negative for activity change, appetite change, chills, fatigue, fever and unexpected weight change.  HENT:  Negative for hearing loss.   Eyes:  Negative for visual disturbance.  Respiratory:  Negative for cough, chest tightness, shortness of breath and wheezing.   Cardiovascular:  Negative for chest pain, palpitations and leg swelling.  Gastrointestinal:  Negative for abdominal distention, abdominal pain, blood in stool, constipation, diarrhea, nausea and vomiting.  Genitourinary:  Negative for difficulty urinating and hematuria.  Musculoskeletal:  Negative for arthralgias, myalgias and neck pain.  Skin:  Negative for rash.  Neurological:  Negative for dizziness,  seizures, syncope and headaches.  Hematological:  Negative for adenopathy. Does not bruise/bleed easily.  Psychiatric/Behavioral:  Negative for dysphoric mood. The patient is not nervous/anxious.    Objective:  BP 140/76    Pulse 69    Temp 97.8 F (36.6 C) (Temporal)    Ht 5' 8.5" (1.74 m)    Wt 221 lb 5 oz (100.4 kg)    SpO2 96%    BMI 33.16 kg/m   Wt Readings from Last 3 Encounters:  09/13/21 221 lb 5 oz (100.4 kg)  08/16/21 221 lb (100.2 kg)  03/20/21 210 lb 3 oz (95.3 kg)      Physical Exam Vitals and nursing note reviewed.  Constitutional:      General: He is not in acute distress.    Appearance: Normal appearance. He is well-developed. He is not ill-appearing.  HENT:     Head: Normocephalic and atraumatic.     Right Ear: Hearing, tympanic membrane, ear canal and external ear normal.     Left Ear: Hearing, tympanic membrane, ear canal and external ear normal.  Eyes:     General: No scleral icterus.    Extraocular Movements: Extraocular movements intact.     Conjunctiva/sclera: Conjunctivae normal.     Pupils: Pupils are equal, round, and reactive to light.  Neck:     Thyroid: No thyroid mass or thyromegaly.  Cardiovascular:     Rate and Rhythm: Normal rate and regular rhythm.     Pulses: Normal pulses.          Radial pulses are 2+ on the right side and 2+ on the left side.     Heart sounds: Normal heart sounds. No murmur heard. Pulmonary:     Effort: Pulmonary effort is normal. No respiratory distress.     Breath sounds: Normal breath sounds. No wheezing, rhonchi or rales.  Abdominal:     General: Bowel sounds are normal. There is no distension.     Palpations: Abdomen is soft. There is no mass.     Tenderness: There is no abdominal tenderness. There is no guarding or rebound.     Hernia: No hernia is present.  Musculoskeletal:        General: Normal range of motion.     Cervical back: Normal range of motion and neck supple.     Right lower leg: No edema.      Left lower leg: No edema.  Lymphadenopathy:     Cervical: No cervical adenopathy.  Skin:    General: Skin is warm and dry.     Findings: No rash.  Neurological:     General: No focal deficit present.     Mental Status: He is alert and oriented to person, place, and time.  Psychiatric:        Mood and Affect: Mood normal.  Behavior: Behavior normal.        Thought Content: Thought content normal.        Judgment: Judgment normal.      Results for orders placed or performed in visit on 08/16/21  CBC  Result Value Ref Range   WBC 8.7 3.4 - 10.8 x10E3/uL   RBC 5.28 4.14 - 5.80 x10E6/uL   Hemoglobin 16.1 13.0 - 17.7 g/dL   Hematocrit 47.3 37.5 - 51.0 %   MCV 90 79 - 97 fL   MCH 30.5 26.6 - 33.0 pg   MCHC 34.0 31.5 - 35.7 g/dL   RDW 12.2 11.6 - 15.4 %   Platelets 205 150 - 450 x10E3/uL  Comp Met (CMET)  Result Value Ref Range   Glucose 152 (H) 70 - 99 mg/dL   BUN 11 8 - 27 mg/dL   Creatinine, Ser 0.94 0.76 - 1.27 mg/dL   eGFR 91 >59 mL/min/1.73   BUN/Creatinine Ratio 12 10 - 24   Sodium 139 134 - 144 mmol/L   Potassium 4.5 3.5 - 5.2 mmol/L   Chloride 102 96 - 106 mmol/L   CO2 22 20 - 29 mmol/L   Calcium 9.4 8.6 - 10.2 mg/dL   Total Protein 7.4 6.0 - 8.5 g/dL   Albumin 4.6 3.8 - 4.8 g/dL   Globulin, Total 2.8 1.5 - 4.5 g/dL   Albumin/Globulin Ratio 1.6 1.2 - 2.2   Bilirubin Total 0.7 0.0 - 1.2 mg/dL   Alkaline Phosphatase 71 44 - 121 IU/L   AST 30 0 - 40 IU/L   ALT 36 0 - 44 IU/L  Lipid Profile  Result Value Ref Range   Cholesterol, Total 113 100 - 199 mg/dL   Triglycerides 127 0 - 149 mg/dL   HDL 31 (L) >39 mg/dL   VLDL Cholesterol Cal 23 5 - 40 mg/dL   LDL Chol Calc (NIH) 59 0 - 99 mg/dL   Chol/HDL Ratio 3.6 0.0 - 5.0 ratio  HgB A1c  Result Value Ref Range   Hgb A1c MFr Bld 7.9 (H) 4.8 - 5.6 %   Est. average glucose Bld gHb Est-mCnc 180 mg/dL   Lab Results  Component Value Date   PSA 0.60 04/20/2020   PSA 0.47 04/14/2019   Assessment & Plan:  This  visit occurred during the SARS-CoV-2 public health emergency.  Safety protocols were in place, including screening questions prior to the visit, additional usage of staff PPE, and extensive cleaning of exam room while observing appropriate contact time as indicated for disinfecting solutions.   Problem List Items Addressed This Visit     Healthcare maintenance - Primary (Chronic)    Preventative protocols reviewed and updated unless pt declined. Discussed healthy diet and lifestyle.       Advanced directives, counseling/discussion (Chronic)    Advanced directive - discussed. Has this at home. HCPOA would be wife Nenita. Asked to bring [place] a copy.       Hyperlipidemia LDL goal < 70 associated with type 2 diabetes mellitus (HCC)    Chronic, LDL <70 on atorvastatin 14m - continue. The ASCVD Risk score (Arnett DK, et al., 2019) failed to calculate for the following reasons:   The valid total cholesterol range is 130 to 320 mg/dL       Relevant Medications   atorvastatin (LIPITOR) 20 MG tablet   empagliflozin (JARDIANCE) 25 MG TABS tablet   metFORMIN (GLUCOPHAGE-XR) 500 MG 24 hr tablet   sildenafil (VIAGRA) 100 MG tablet   Obstructive  sleep apnea    Appreciate pulm care - continue CPAP.       Gastroesophageal reflux disease without esophagitis    Stable period on brand nexium - continue.       Relevant Medications   NEXIUM 40 MG capsule   Diabetes mellitus type 2 with complications (HCC)    Chronic, A1c again elevated. Will increase metformin XR to 1559m daily.       Relevant Medications   atorvastatin (LIPITOR) 20 MG tablet   empagliflozin (JARDIANCE) 25 MG TABS tablet   metFORMIN (GLUCOPHAGE-XR) 500 MG 24 hr tablet   Colon polyps    Again reviewed he is overdue for colonoscopy (was due 2014), reviewed indication 15 polyps with multiple TAs. # provided to call [facility] to schedule colonoscopy.       Obesity, Class I, BMI 30.0-34.9 (see actual BMI)    Encouraged healthy diet  and lifestyle choices to affect sustainable weight loss.       Erectile dysfunction    Refill viagra per pt request. Reviewed need to avoid nitrates while on this medication.       Essential hypertension    Chronic, adequate off medication.       Relevant Medications   atorvastatin (LIPITOR) 20 MG tablet   sildenafil (VIAGRA) 100 MG tablet   Coronary artery disease involving native coronary artery of native heart without angina pectoris    Appreciate cardiology care.       Relevant Medications   atorvastatin (LIPITOR) 20 MG tablet   sildenafil (VIAGRA) 100 MG tablet   Other Visit Diagnoses     Special screening for malignant neoplasm of prostate            Meds ordered this encounter  Medications   atorvastatin (LIPITOR) 20 MG tablet    Sig: Take 1 tablet (20 mg total) by mouth daily.    Dispense:  90 tablet    Refill:  3   empagliflozin (JARDIANCE) 25 MG TABS tablet    Sig: Take 1 tablet (25 mg total) by mouth daily.    Dispense:  90 tablet    Refill:  3   metFORMIN (GLUCOPHAGE-XR) 500 MG 24 hr tablet    Sig: Take 1 tablet (500 mg total) by mouth in the morning AND 2 tablets (1,000 mg total) at bedtime.    Dispense:  270 tablet    Refill:  3   sildenafil (VIAGRA) 100 MG tablet    Sig: Take 0.5 tablets (50 mg total) by mouth daily as needed for erectile dysfunction.    Dispense:  30 tablet    Refill:  3   NEXIUM 40 MG capsule    Sig: Take 1 capsule (40 mg total) by mouth daily at 12 noon.    Dispense:  90 capsule    Refill:  3   No orders of the defined types were placed in this encounter.    Patient instructions: Labs today and urine microalbumin.  [facility] as you're overdue at ([phone]  Bring [place] your advanced [place] to update your chart.  Increase metformin to 1 tablet in the morning, 2 in the evening.  Work on diabetic diet, low sugar low carb.  Return as needed or in 6 months for  diabetes follow up visit.   Follow up plan: Return in about 6 months (around 03/14/2022) for follow up visit.  JRia Bush MD

## 2021-09-13 ENCOUNTER — Encounter: Payer: Self-pay | Admitting: Family Medicine

## 2021-09-13 ENCOUNTER — Ambulatory Visit (INDEPENDENT_AMBULATORY_CARE_PROVIDER_SITE_OTHER): Payer: 59 | Admitting: Family Medicine

## 2021-09-13 ENCOUNTER — Other Ambulatory Visit: Payer: Self-pay

## 2021-09-13 VITALS — BP 140/76 | HR 69 | Temp 97.8°F | Ht 68.5 in | Wt 221.3 lb

## 2021-09-13 DIAGNOSIS — Z0000 Encounter for general adult medical examination without abnormal findings: Secondary | ICD-10-CM

## 2021-09-13 DIAGNOSIS — E785 Hyperlipidemia, unspecified: Secondary | ICD-10-CM

## 2021-09-13 DIAGNOSIS — Z125 Encounter for screening for malignant neoplasm of prostate: Secondary | ICD-10-CM

## 2021-09-13 DIAGNOSIS — G4733 Obstructive sleep apnea (adult) (pediatric): Secondary | ICD-10-CM

## 2021-09-13 DIAGNOSIS — Z7189 Other specified counseling: Secondary | ICD-10-CM | POA: Insufficient documentation

## 2021-09-13 DIAGNOSIS — I10 Essential (primary) hypertension: Secondary | ICD-10-CM

## 2021-09-13 DIAGNOSIS — D126 Benign neoplasm of colon, unspecified: Secondary | ICD-10-CM

## 2021-09-13 DIAGNOSIS — K219 Gastro-esophageal reflux disease without esophagitis: Secondary | ICD-10-CM

## 2021-09-13 DIAGNOSIS — E118 Type 2 diabetes mellitus with unspecified complications: Secondary | ICD-10-CM | POA: Diagnosis not present

## 2021-09-13 DIAGNOSIS — N529 Male erectile dysfunction, unspecified: Secondary | ICD-10-CM

## 2021-09-13 DIAGNOSIS — E1169 Type 2 diabetes mellitus with other specified complication: Secondary | ICD-10-CM

## 2021-09-13 DIAGNOSIS — I2510 Atherosclerotic heart disease of native coronary artery without angina pectoris: Secondary | ICD-10-CM

## 2021-09-13 DIAGNOSIS — E669 Obesity, unspecified: Secondary | ICD-10-CM

## 2021-09-13 LAB — PSA: PSA: 0.29 ng/mL (ref 0.10–4.00)

## 2021-09-13 MED ORDER — NEXIUM 40 MG PO CPDR: 40.0000 mg | DELAYED_RELEASE_CAPSULE | Freq: Every day | ORAL | 3 refills | Status: DC

## 2021-09-13 MED ORDER — METFORMIN HCL ER 500 MG PO TB24: ORAL_TABLET | ORAL | 3 refills | Status: DC

## 2021-09-13 MED ORDER — SILDENAFIL CITRATE 100 MG PO TABS: 50.0000 mg | ORAL_TABLET | Freq: Every day | ORAL | 3 refills | Status: DC | PRN

## 2021-09-13 MED ORDER — ATORVASTATIN CALCIUM 20 MG PO TABS: 20.0000 mg | ORAL_TABLET | Freq: Every day | ORAL | 3 refills | Status: DC

## 2021-09-13 MED ORDER — EMPAGLIFLOZIN 25 MG PO TABS: 25.0000 mg | ORAL_TABLET | Freq: Every day | ORAL | 3 refills | Status: DC

## 2021-09-13 NOTE — Assessment & Plan Note (Signed)
Preventative protocols reviewed and updated unless pt declined. Discussed healthy diet and lifestyle.  

## 2021-09-13 NOTE — Assessment & Plan Note (Signed)
Chronic, A1c again elevated. Will increase metformin XR to 1500mg  daily.

## 2021-09-13 NOTE — Addendum Note (Signed)
Addended by: Ellamae Sia on: 09/13/2021 03:30 PM   Modules accepted: Orders

## 2021-09-13 NOTE — Assessment & Plan Note (Signed)
Stable period on brand nexium - continue.

## 2021-09-13 NOTE — Assessment & Plan Note (Signed)
Advanced directive - discussed. Has this at home. HCPOA would be wife Nenita. Asked to bring us a copy.  

## 2021-09-13 NOTE — Assessment & Plan Note (Signed)
Refill viagra per pt request. Reviewed need to avoid nitrates while on this medication.

## 2021-09-13 NOTE — Assessment & Plan Note (Signed)
Chronic, LDL <70 on atorvastatin 20mg  - continue. The ASCVD Risk score (Arnett DK, et al., 2019) failed to calculate for the following reasons:   The valid total cholesterol range is 130 to 320 mg/dL

## 2021-09-13 NOTE — Patient Instructions (Addendum)
Labs today and urine microalbumin.  Call Dr Vena Rua office [facility] to schedule a colonoscopy as you're overdue at [phone].  Bring [place] copy of your advanced directive to update your chart.  Increase metformin to 1 tablet in the morning, 2 in the evening.  Work on diabetic diet, low sugar low carb.  Return as needed or in 6 months for diabetes follow up visit.   Health Maintenance, Male Adopting a healthy lifestyle and getting preventive care are important in promoting health and wellness. Ask your health care provider about: The right schedule for you to have regular tests and exams. Things you can do on your own to prevent diseases and keep yourself healthy. What should I know about diet, weight, and exercise? Eat a healthy diet  Eat a diet that includes plenty of vegetables, fruits, low-fat dairy products, and lean protein. Do not eat a lot of foods that are high in solid fats, added sugars, or sodium. Maintain a healthy weight Body mass index (BMI) is a measurement that can be used to identify possible weight problems. It estimates body fat based on height and weight. Your health care provider can help determine your BMI and help you achieve or maintain a healthy weight. Get regular exercise Get regular exercise. This is one of the most important things you can do for your health. Most adults should: Exercise for at least 150 minutes each week. The exercise should increase your heart rate and make you sweat (moderate-intensity exercise). Do strengthening exercises at least twice a week. This is in addition to the moderate-intensity exercise. Spend less time sitting. Even light physical activity can be beneficial. Watch cholesterol and blood lipids Have your blood tested for lipids and cholesterol at 64 years of age, then have this test every 5 years. You may need to have your cholesterol levels checked more often if: Your lipid or cholesterol levels are high. You are older than  64 years of age. You are at high risk for heart disease. What should I know about cancer screening? Many types of cancers can be detected early and may often be prevented. Depending on your health history and family history, you may need to have cancer screening at various ages. This may include screening for: Colorectal cancer. Prostate cancer. Skin cancer. Lung cancer. What should I know about heart disease, diabetes, and high blood pressure? Blood pressure and heart disease High blood pressure causes heart disease and increases the risk of stroke. This is more likely to develop in people who have high blood pressure readings or are overweight. Talk with your health care provider about your target blood pressure readings. Have your blood pressure checked: Every 3-5 years if you are 64-5 years of age. Every year if you are 82 years old or older. If you are between the ages of 98 and 81 and are a current or former smoker, ask your health care provider if you should have a one-time screening for abdominal aortic aneurysm (AAA). Diabetes Have regular diabetes screenings. This checks your fasting blood sugar level. Have the screening done: Once every three years after age 36 if you are at a normal weight and have a low risk for diabetes. More often and at a younger age if you are overweight or have a high risk for diabetes. What should I know about preventing infection? Hepatitis B If you have a higher risk for hepatitis B, you should be screened for this virus. Talk with your health care provider to  find out if you are at risk for hepatitis B infection. Hepatitis C Blood testing is recommended for: Everyone born from [birth year] through 1964. Anyone with known risk factors for hepatitis C. Sexually transmitted infections (STIs) You should be screened each year for STIs, including gonorrhea and chlamydia, if: You are sexually active and are younger than 64 years of age. You are older than 64  years of age and your health care provider tells you that you are at risk for this type of infection. Your sexual activity has changed since you were last screened, and you are at increased risk for chlamydia or gonorrhea. Ask your health care provider if you are at risk. Ask your health care provider about whether you are at high risk for HIV. Your health care provider may recommend a prescription medicine to help prevent HIV infection. If you choose to take medicine to prevent HIV, you should first get tested for HIV. You should then be tested every 3 months for as long as you are taking the medicine. Follow these instructions at home: Alcohol use Do not drink alcohol if your health care provider tells you not to drink. If you drink alcohol: Limit how much you have to 0-2 drinks a day. Know how much alcohol is in your drink. In the [place] one drink equals one 12 oz bottle of beer (355 mL), one 5 oz glass of wine (148 mL), or one 1 oz glass of hard liquor (44 mL). Lifestyle Do not use any products that contain nicotine or tobacco. These products include cigarettes, chewing tobacco, and vaping devices, such as e-cigarettes. If you need help quitting, ask your health care provider. Do not use street drugs. Do not share needles. Ask your health care provider for help if you need support or information about quitting drugs. General instructions Schedule regular health, dental, and eye exams. Stay current with your vaccines. Tell your health care provider if: You often feel depressed. You have ever been abused or do not feel safe at home. Summary Adopting a healthy lifestyle and getting preventive care are important in promoting health and wellness. Follow your health care provider's instructions about healthy diet, exercising, and getting tested or screened for diseases. Follow your health care provider's instructions on monitoring your cholesterol and blood pressure. This information is not  intended to replace advice given to you by your health care provider. Make sure you discuss any questions you have with your health care provider. Document Revised: 01/21/2021 Document Reviewed: 01/21/2021 Elsevier Patient Education  [place].

## 2021-09-13 NOTE — Assessment & Plan Note (Signed)
Encouraged healthy diet and lifestyle choices to affect sustainable weight loss.  ?

## 2021-09-13 NOTE — Assessment & Plan Note (Signed)
Chronic, adequate off medication.

## 2021-09-13 NOTE — Assessment & Plan Note (Signed)
Again reviewed he is overdue for colonoscopy (was due 2014), reviewed indication 15 polyps with multiple TAs. # provided to call [facility] to schedule colonoscopy.

## 2021-09-13 NOTE — Assessment & Plan Note (Signed)
Appreciate cardiology care.  °

## 2021-09-13 NOTE — Assessment & Plan Note (Signed)
Appreciate pulm care - continue CPAP.

## 2021-09-14 LAB — MICROALBUMIN / CREATININE URINE RATIO
Creatinine, Urine: 58 mg/dL (ref 20–320)
Microalb Creat Ratio: 12 mcg/mg creat (ref <30)
Microalb, Ur: 0.7 mg/dL

## 2021-09-16 LAB — FRUCTOSAMINE: Fructosamine: 314 umol/L — ABNORMAL HIGH (ref 205–285)

## 2021-09-29 ENCOUNTER — Other Ambulatory Visit: Payer: Self-pay | Admitting: Family Medicine

## 2021-10-15 ENCOUNTER — Telehealth: Payer: Self-pay

## 2021-10-15 NOTE — Telephone Encounter (Signed)
Noted  

## 2021-10-15 NOTE — Telephone Encounter (Signed)
PA for brand name Nexium has been submitted via covermymeds. -pending decision Key: [id number] - PA Case ID: [id number]  Patient has been on brand name since 01/29/2011-when this was prescribed by cardiologist per epic notes. Previously tried OTC Omeprazole 20 mg and Lansoprazole 30 mg per notes in 11/17/2008 and 11/16/2009.

## 2021-10-16 NOTE — Telephone Encounter (Signed)
PA approved for dates 10/16/21-10/16/22. [facility] advised via fax today/

## 2021-10-18 ENCOUNTER — Other Ambulatory Visit: Payer: Self-pay | Admitting: Family Medicine

## 2021-10-31 ENCOUNTER — Other Ambulatory Visit: Payer: Self-pay

## 2021-10-31 MED ORDER — ONETOUCH ULTRA VI STRP: ORAL_STRIP | 3 refills | Status: DC

## 2021-11-12 ENCOUNTER — Ambulatory Visit: Payer: 59 | Admitting: Internal Medicine

## 2022-01-18 NOTE — Progress Notes (Signed)
HPI ?male former smoker followed for OSA, complicated by CAD/ 3vPCI, DM2, GERD, HBP, hyperlipidemia ?NPSG 11/30/07 - AHI 70/ hr weight 260 lbs ?Office Spirometry 08/11/17-mild restriction of exhaled volume-FVC 3.40/71%, FEV1 2.79/77%, ratio 0.82, FEF 25-75% 3.52/118% ?HST 01/30/17-AHI 72.3/hour, desaturation to 76%, body weight 230 pounds ?--------------------------------------------------------------------------------------------------- ? ? ?11/13/20-  66 year old male former smoker followed for OSA, complicated by CAD/ 3vPCI, DM2, GERD, HBP, hyperlipidemia ?CPAP auto 5-20/ [facility] ?Download- compliance 43% AHI 0.1/ hr ?Body weight today-217 lbs ?Covid vax- None ?Flu vax-had ?Machine now about 65 years old. He had questions about pressure and humidifier. Download reviewed.  Had questions about Dawna Part and might be a candidate, but doesn't want surgery. Admits he feels better when he wears CPAP. Still says he falls asleep before putting CPAP on.  ? ?01/20/22- 66 year old male former smoker followed for OSA, complicated by CAD/ 3vPCI, DM2, GERD, HBP, hyperlipidemia ?CPAP auto 5-20/ [facility] ?Download- compliance 40%, AHI 0.1/ hr ?Body weight today 228 lbs ?Covid vax- None ?Flu vax-had ?Download reviewed and discussed with him.  He still tends to leave his CPAP off after getting up in the night for the bathroom.  Sometimes does not put it on or falls asleep first.  Discussed compliance goals and purpose with him.  Machine is old enough to replace.  He is noticing the humidifier does not seem to work right.  Admits he sleeps better when he wears CPAP. ? ?ROS-see HPI + = positive ?Constitutional:   + weight loss, night sweats, fevers, chills, fatigue, lassitude. ?HEENT:    headaches, difficulty swallowing, tooth/dental problems, sore throat,  ?     sneezing, itching, ear ache, + nasal congestion, post nasal drip, snoring ?CV:    chest pain, orthopnea, PND, swelling in lower extremities, anasarca,                                                           dizziness, palpitations ?Resp:   shortness of breath with exertion or at rest.   ?             productive cough,   non-productive cough, coughing up of blood.   ?           change in color of mucus.  wheezing.   ?Skin:    rash or lesions. ?GI:  No-   heartburn, indigestion, abdominal pain, nausea, vomiting, diarrhea,  ?               change in bowel habits, loss of appetite ?GU: dysuria, change in color of urine, no urgency or frequency.   flank pain. ?MS:   + Joint pain, stiffness, decreased range of motion, back pain. ?Neuro-     nothing unusual ?Psych:  change in mood or affect.  depression or anxiety.   memory loss. ? ?OBJ- Physical Exam ?General- Alert, Oriented, Affect-appropriate, Distress- none acute ?Skin- rash-none, lesions- none, excoriation- none ?Lymphadenopathy- none ?Head- atraumatic, + small mandible/ beard ?           Eyes- Gross vision intact, PERRLA, conjunctivae and secretions clear ?           Ears- Hearing, canals-normal ?           Nose-+ turbinate edema +-Septal dev, mucus, polyps, erosion, perforation  ?  Throat- Mallampati III , mucosa clear , drainage- none, tonsils- atrophic ?Neck- flexible , trachea midline, no stridor , thyroid nl, carotid no bruit ?Chest - symmetrical excursion , unlabored ?          Heart/CV- RRR , no murmur , no gallop  , no rub, nl s1 s2 ?                          - JVD- none , edema- none, stasis changes- none, varices- none ?          Lung- clear to P&A, wheeze- none, cough- none , dullness-none, rub- none ?          Chest wall-  ?Abd-  ?Br/ Gen/ Rectal- Not done, not indicated ?Extrem- cyanosis- none, clubbing, none, atrophy- none, strength- nl ?Neuro- grossly intact to observation ? ? ? ? ? ?

## 2022-01-20 ENCOUNTER — Encounter: Payer: Self-pay | Admitting: Internal Medicine

## 2022-01-20 ENCOUNTER — Ambulatory Visit (INDEPENDENT_AMBULATORY_CARE_PROVIDER_SITE_OTHER): Payer: Medicare Other | Admitting: Internal Medicine

## 2022-01-20 VITALS — BP 120/60 | HR 70 | Temp 98.3°F | Ht 69.0 in | Wt 228.6 lb

## 2022-01-20 DIAGNOSIS — G4733 Obstructive sleep apnea (adult) (pediatric): Secondary | ICD-10-CM | POA: Diagnosis not present

## 2022-01-20 DIAGNOSIS — I2510 Atherosclerotic heart disease of native coronary artery without angina pectoris: Secondary | ICD-10-CM

## 2022-01-20 NOTE — Assessment & Plan Note (Signed)
I encouraged him to continue working with his PCP and cardiology.  He tends to lose track of things a bit driving back and forth between [place] and [employer]. ?

## 2022-01-20 NOTE — Patient Instructions (Signed)
Order- DME [facility]- please replace old CPAP machine, change to auto 5-15, mask of choice, humidifier, supplies, AirView/ card ? ?Please try to use you CPAP as much of every night as you can. We think it is good for you.  ?

## 2022-01-20 NOTE — Assessment & Plan Note (Signed)
Benefits from CPAP.  Reemphasized compliance goals and purpose for using CPAP.  Machine is due to be replaced. ?Plan-replace CPAP and change to auto 5-15 ?

## 2022-08-17 ENCOUNTER — Other Ambulatory Visit: Payer: Self-pay | Admitting: Internal Medicine

## 2022-08-21 ENCOUNTER — Encounter: Payer: Self-pay | Admitting: Internal Medicine

## 2022-08-21 ENCOUNTER — Ambulatory Visit: Payer: Medicare Other | Attending: Internal Medicine | Admitting: Internal Medicine

## 2022-08-21 VITALS — BP 122/80 | HR 56 | Ht 69.0 in | Wt 222.5 lb

## 2022-08-21 DIAGNOSIS — E785 Hyperlipidemia, unspecified: Secondary | ICD-10-CM | POA: Insufficient documentation

## 2022-08-21 DIAGNOSIS — I25119 Atherosclerotic heart disease of native coronary artery with unspecified angina pectoris: Secondary | ICD-10-CM | POA: Diagnosis present

## 2022-08-21 DIAGNOSIS — Z79899 Other long term (current) drug therapy: Secondary | ICD-10-CM | POA: Diagnosis present

## 2022-08-21 DIAGNOSIS — R0602 Shortness of breath: Secondary | ICD-10-CM | POA: Insufficient documentation

## 2022-08-21 DIAGNOSIS — E1169 Type 2 diabetes mellitus with other specified complication: Secondary | ICD-10-CM | POA: Insufficient documentation

## 2022-08-21 DIAGNOSIS — I2510 Atherosclerotic heart disease of native coronary artery without angina pectoris: Secondary | ICD-10-CM

## 2022-08-21 MED ORDER — NEXIUM 40 MG PO CPDR: 40.0000 mg | DELAYED_RELEASE_CAPSULE | Freq: Every day | ORAL | 3 refills | Status: DC

## 2022-08-21 MED ORDER — NITROGLYCERIN 0.4 MG SL SUBL: SUBLINGUAL_TABLET | SUBLINGUAL | 3 refills | Status: DC

## 2022-08-21 NOTE — Progress Notes (Signed)
Follow-up Outpatient Visit Date: 08/21/2022  Primary Care Provider: Ria Bush, [place] [address]  Chief Complaint: Shortness of breath with activity  HPI:  [name] is a 66 y.o. male with history of coronary artery disease status post bare-metal stents to the LAD and RCA in 2002 as well as angioplasty to the LCx, hyperlipidemia, type 2 diabetes mellitus, GERD, and obstructive sleep apnea on CPAP, who presents for follow-up of coronary artery disease.  I last saw him a year ago, at which time he was feeling well.  Chronic exertional dyspnea present for years was unchanged.  Today, [name] is concerned about progression of his CAD.  He feels like his shortness of breath is a little worse than a year ago.  He notes it is present both at rest and with activity.  He has not had any frank chest pain.  He denies frank palpitations but feels like his heart rate speeds up when he does mild activity.  He also notes that with anxiety/nervousness at times.  He has experienced occasional lightheadedness, though this is infrequent.  He denies syncope or falls.  Home blood pressures have typically been 120-135/70-85.  He is no longer taking empagliflozin due to its cost.  --------------------------------------------------------------------------------------------------  Cardiovascular History & Procedures: Cardiovascular Problems: CAD s/p PCI & PTCA   Risk Factors: Known CAD, hypertension, hyperlipidemia, diabetes mellitus, male gender, and age > 62   Cath/PCI: PCI (05/05/01): PCI to mid RCA with NIR 3.0 x 13 mm BMS (post-dilated with 3.25 mm balloon). POBA to mid LCx with 2.25 mm cutting balloon. PCI (05/03/01): PCI to proximal LAD CTO with Pixel 2.5 x 23 mm BMS (post-dilated with 3.0 mm balloon); POBA to mid LAD stenosis with 2.25 mm balloon LHC (04/30/01): LMCA normal. LAD with CTO of proximal vessel.  LCx with large OM1 followed by 90% stenosis in small AV  groove LCx.  Dominant RCA with 90% midvessel stenosis.  Preserved LVEF with mid/apical anterior akinesis.   CV Surgery: None   EP Procedures and Devices: None    Non-Invasive Evaluation(s): TTE (12/13/2019): Borderline LV enlargement and hypertrophy with LVEF 55-60%.  Normal diastolic function and wall motion.  Normal RV size and function.  Mild right atrial enlargement.  Mild mitral regurgitation. Exercise myocardial perfusion stress test (12/23/05): Fair exercise capacity without evidence of ischemia or scar. LVEF 53%.  Recent CV Pertinent Labs: Lab Results  Component Value Date   CHOL 113 08/16/2021   HDL 31 (L) 08/16/2021   LDLCALC 59 08/16/2021   LDLDIRECT 70.8 06/30/2012   TRIG 127 08/16/2021   CHOLHDL 3.6 08/16/2021   CHOLHDL 3 04/20/2020   K 4.5 08/16/2021   MG 1.9 10/20/2016   BUN 11 08/16/2021   CREATININE 0.94 08/16/2021   CREATININE 0.92 02/15/2021    Past medical and surgical history were reviewed and updated in EPIC.  Current Meds  Medication Sig   aspirin EC 81 MG tablet Take 1 tablet (81 mg total) by mouth daily.   atorvastatin (LIPITOR) 20 MG tablet Take 1 tablet (20 mg total) by mouth daily.   Blood Glucose Monitoring Suppl (ONE TOUCH ULTRA 2) w/Device KIT Use as directed E11.65   glucose blood (ONETOUCH ULTRA) test strip Check sugar once daily as needed DX E11.69   metFORMIN (GLUCOPHAGE-XR) 500 MG 24 hr tablet Take 1 tablet (500 mg total) by mouth in the morning AND 2 tablets (1,000 mg total) at bedtime.   NEXIUM 40 MG capsule  Take 1 capsule (40 mg total) by mouth daily at 12 noon.   nitroGLYCERIN (NITROSTAT) 0.4 MG SL tablet PLACE 1 TABLET UNDER THE TONGUE EVERY 5 MINUTES AS NEEDED FOR CHEST PAIN.   OneTouch Delica Lancets 94W MISC 1 each by Other route 2 (two) times daily as needed.   sildenafil (VIAGRA) 100 MG tablet Take 0.5 tablets (50 mg total) by mouth daily as needed for erectile dysfunction.    Allergies: Patient has no known allergies.  Social  History   Tobacco Use   Smoking status: Former    Packs/day: 1.00    Years: 15.00    Total pack years: 15.00    Types: Cigarettes    Quit date: 09/15/2000    Years since quitting: 21.9   Smokeless tobacco: Never  Vaping Use   Vaping Use: Never used  Substance Use Topics   Alcohol use: No   Drug use: No    Family History  Problem Relation Age of Onset   Lung cancer Father 49       smoker   Hypertension Mother    Diabetes Neg Hx    Coronary artery disease Neg Hx    Stroke Neg Hx    Colon cancer Neg Hx    Esophageal cancer Neg Hx    Rectal cancer Neg Hx    Stomach cancer Neg Hx     Review of Systems: A 12-system review of systems was performed and was negative except as noted in the HPI.  --------------------------------------------------------------------------------------------------  Physical Exam: BP 122/80 (BP Location: Left Arm, Patient Position: Sitting, Cuff Size: Normal)   Pulse (!) 56   Ht _0  (1.753 m)   Wt 222 lb 8 oz (100.9 kg)   SpO2 95%   BMI 32.86 kg/m   General:  NAD. Neck: No JVD or HJR. Lungs: Clear to auscultation bilaterally without wheezes or crackles. Heart: Bradycardic but regular without murmurs, rubs, or gallops. Abdomen: Soft, nontender, nondistended. Extremities: No lower extremity edema.  EKG: Sinus bradycardia with short PR interval.  Otherwise, no significant abnormality.  Lab Results  Component Value Date   WBC 8.7 08/16/2021   HGB 16.1 08/16/2021   HCT 47.3 08/16/2021   MCV 90 08/16/2021   PLT 205 08/16/2021    Lab Results  Component Value Date   NA 139 08/16/2021   K 4.5 08/16/2021   CL 102 08/16/2021   CO2 22 08/16/2021   BUN 11 08/16/2021   CREATININE 0.94 08/16/2021   GLUCOSE 152 (H) 08/16/2021   ALT 36 08/16/2021    Lab Results  Component Value Date   CHOL 113 08/16/2021   HDL 31 (L) 08/16/2021   LDLCALC 59 08/16/2021   LDLDIRECT 70.8 06/30/2012   TRIG 127 08/16/2021   CHOLHDL 3.6 08/16/2021     --------------------------------------------------------------------------------------------------  ASSESSMENT AND PLAN: Coronary artery disease and shortness of breath: [name] reports some progression in his shortness of breath, present with modest activity and occasionally at rest.  He is concerned about the progression of his CAD status post remote bare-metal stent placement.  We have agreed to obtain an exercise myocardial perfusion stress test (can be converted to regadenoson if he is unable to achieve target heart rate) as well as a transthoracic echocardiogram at his convenience.  We will plan to continue aspirin and atorvastatin for secondary prevention.  We will check a CBC, CMP, and lipid panel when [name] returns for his stress test.  Hyperlipidemia associated with type 2 diabetes mellitus: Lipids  well-controlled on last check a year ago.  Continue atorvastatin 20 mg daily and ongoing follow-up of DM with Dr. Danise Mina.  We will check a fasting lipid panel and CMP when [name] returns for his stress test.  GERD: I will refill [name] esomeprazole at his request today, as it has been controlling his symptoms well.  Shared Decision Making/Informed Consent The risks [chest pain, shortness of breath, cardiac arrhythmias, dizziness, blood pressure fluctuations, myocardial infarction, stroke/transient ischemic attack, nausea, vomiting, allergic reaction, radiation exposure, metallic taste sensation and life-threatening complications (estimated to be 1 in 10,000)], benefits (risk stratification, diagnosing coronary artery disease, treatment guidance) and alternatives of a nuclear stress test were discussed in detail with [name] and he agrees to proceed.  Follow-up: Return to clinic in 4-6 weeks.  Nelva Bush, MD 08/21/2022 4:12 PM

## 2022-08-21 NOTE — Patient Instructions (Addendum)
Medication Instructions:  Your Physician recommend you continue on your current medication as directed.    *If you need a refill on your cardiac medications before your next appointment, please call your pharmacy*   Lab Work: Your provider would like for you to return to have the following labs drawn: (CBC, CMP, Lipid).   Please go to the [facility] entrance and check in at the front desk.  You do not need an appointment.  They are open from 7am-6 pm.  You will need to be fasting.    Testing/Procedures: Your physician has requested that you have an echocardiogram. Echocardiography is a painless test that uses sound waves to create images of your heart. It provides your doctor with information about the size and shape of your heart and how well your heart's chambers and valves are working.   You may receive an ultrasound enhancing agent through an IV if needed to better visualize your heart during the echo. This procedure takes approximately one hour.  There are no restrictions for this procedure.  This will take place at [facility] [address] [id number], [place]   Your physician has requested that you have en exercise stress myoview. For further information please visit [email or web address]. Please follow instruction sheet, as given.  [place]  Follow-Up: At [facility], you and your health needs are our priority.  As part of our continuing mission to provide you with exceptional heart care, we have created designated Provider Care Teams.  These Care Teams include your primary Cardiologist (physician) and Advanced Practice Providers (APPs -  Physician Assistants and Nurse Practitioners) who all work together to provide you with the care you need, when you need it.  We recommend signing up for the patient portal called "MyChart".  Sign up information is provided on this After Visit Summary.  MyChart is used to connect with patients for  Virtual Visits (Telemedicine).  Patients are able to view lab/test results, encounter notes, upcoming appointments, etc.  Non-urgent messages can be sent to your provider as well.   To learn more about what you can do with MyChart, go to [email or web address].    Your next appointment:   4-6 week(s)  The format for your next appointment:   In Person  Provider:   You may see Nelva Bush, MD or one of the following Advanced Practice Providers on your designated Care Team:   Murray Hodgkins, NP Christell Faith, PA-C Cadence Kathlen Mody, PA-C Gerrie Nordmann, NP    Your provider has ordered a Lexiscan/ Exercise Myoview Stress test. This will take place at [facility]. Please report to the [facility] medical mall entrance. The volunteers at the first desk will direct you where to go.  [place]  Your provider has ordered a Stress Test with nuclear imaging. The purpose of this test is to evaluate the blood supply to your heart muscle. This procedure is referred to as a "Non-Invasive Stress Test." This is because other than having an IV started in your vein, nothing is inserted or "invades" your body. Cardiac stress tests are done to find areas of poor blood flow to the heart by determining the extent of coronary artery disease (CAD). Some patients exercise on a treadmill, which naturally increases the blood flow to your heart, while others who are unable to walk on a treadmill due to physical limitations will have a pharmacologic/chemical stress agent called Lexiscan . This medicine will mimic walking on a treadmill by temporarily increasing  your coronary blood flow.   Please note: these test may take anywhere between 2-4 hours to complete  How to prepare for your Myoview test:  Do not eat or drink after midnight No caffeine for 24 hours prior to test No smoking 24 hours prior to test. Your medication may be taken with water.  If your doctor stopped a medication because of this test, do not take that  medication. Ladies, please do not wear dresses.  Skirts or pants are appropriate. Please wear a short sleeve shirt. No perfume, cologne or lotion. Wear comfortable walking shoes. No heels!   PLEASE NOTIFY THE OFFICE AT LEAST 87 HOURS IN ADVANCE IF YOU ARE UNABLE TO KEEP YOUR APPOINTMENT.  [phone] AND  PLEASE NOTIFY NUCLEAR MEDICINE AT [facility] AT LEAST 24 HOURS IN ADVANCE IF YOU ARE UNABLE TO KEEP YOUR APPOINTMENT. [phone]

## 2022-08-23 ENCOUNTER — Encounter: Payer: Self-pay | Admitting: Internal Medicine

## 2022-08-26 ENCOUNTER — Other Ambulatory Visit: Payer: 59

## 2022-09-17 ENCOUNTER — Encounter
Admission: RE | Admit: 2022-09-17 | Discharge: 2022-09-17 | Disposition: A | Discharge status: Home / Self Care | Payer: Medicare Other | Source: Ambulatory Visit | Attending: Internal Medicine | Admitting: Internal Medicine

## 2022-09-17 DIAGNOSIS — R0602 Shortness of breath: Secondary | ICD-10-CM | POA: Diagnosis present

## 2022-09-17 DIAGNOSIS — I25119 Atherosclerotic heart disease of native coronary artery with unspecified angina pectoris: Secondary | ICD-10-CM | POA: Diagnosis present

## 2022-09-17 LAB — NM MYOCAR MULTI W/SPECT W/WALL MOTION / EF
Angina Index: 0
Duke Treadmill Score: 7
Estimated workload: 7
Exercise duration (min): 6 min
Exercise duration (sec): 30 s
LV dias vol: 106 mL (ref 62–150)
LV sys vol: 39 mL
MPHR: 155 {beats}/min
Nuc Stress EF: 63 %
Peak HR: 139 {beats}/min
Percent HR: 89 %
Rest HR: 62 {beats}/min
Rest Nuclear Isotope Dose: 10.7 mCi
SDS: 5
SRS: 0
SSS: 1
ST Depression (mm): 0 mm
Stress Nuclear Isotope Dose: 28.3 mCi
TID: 0.88

## 2022-09-17 MED ORDER — REGADENOSON 0.4 MG/5ML IV SOLN: 0.4000 mg | Freq: Once | INTRAVENOUS | Status: DC

## 2022-09-17 MED ORDER — TECHNETIUM TC 99M TETROFOSMIN IV KIT
10.0000 | PACK | Freq: Once | INTRAVENOUS | Status: AC
  Administered 2022-09-17: 10.71 via INTRAVENOUS

## 2022-09-17 MED ORDER — TECHNETIUM TC 99M TETROFOSMIN IV KIT
30.0000 | PACK | Freq: Once | INTRAVENOUS | Status: AC
  Administered 2022-09-17: 28.28 via INTRAVENOUS

## 2022-09-18 ENCOUNTER — Telehealth: Payer: Self-pay | Admitting: Internal Medicine

## 2022-09-18 DIAGNOSIS — R9389 Abnormal findings on diagnostic imaging of other specified body structures: Secondary | ICD-10-CM

## 2022-09-18 DIAGNOSIS — Z01812 Encounter for preprocedural laboratory examination: Secondary | ICD-10-CM

## 2022-09-18 NOTE — Telephone Encounter (Signed)
Results of yesterday stress test discussed over the phone with [name], including small apical/apical anterior defect favoring scar with peri-infarct ischemia and less likely artifact.  We also discussed faint opacity in the right lower lobe that is nonspecific.  He feels like his breathing is a little better than when I saw him in the office last month.  He has not had any fevers, chills, or chest pain.  We have agreed to defer medication changes at this time and proceed with echocardiogram as scheduled.  We will also arrange for dedicated CT of the chest to better evaluate the patchy opacity in the right lower lobe.  We will follow-up as planned next month in the office to reassess his symptoms.  I advised him to contact Dr. Danise Mina right away if he has worsening shortness of breath, cough, or fevers.  Nelva Bush, MD [facility]

## 2022-09-18 NOTE — Telephone Encounter (Signed)
Chest CT with contrast ordered a requested by MD.  Pt made aware of the following. Have BMP drawn 2-3 days prior to test Nothing to eat or drink past midnight Hold metformin morning of and 48 hours after test  Pt verbalized understanding.

## 2022-09-20 LAB — HM DIABETES EYE EXAM

## 2022-09-23 ENCOUNTER — Encounter: Payer: Self-pay | Admitting: Family Medicine

## 2022-09-24 ENCOUNTER — Other Ambulatory Visit
Admission: RE | Admit: 2022-09-24 | Discharge: 2022-09-24 | Disposition: A | Discharge status: Home / Self Care | Payer: Medicare Other | Source: Ambulatory Visit | Attending: Internal Medicine | Admitting: Internal Medicine

## 2022-09-24 DIAGNOSIS — Z01812 Encounter for preprocedural laboratory examination: Secondary | ICD-10-CM | POA: Insufficient documentation

## 2022-09-24 LAB — BASIC METABOLIC PANEL
Anion gap: 9 (ref 5–15)
BUN: 10 mg/dL (ref 8–23)
CO2: 22 mmol/L (ref 22–32)
Calcium: 9.1 mg/dL (ref 8.9–10.3)
Chloride: 104 mmol/L (ref 98–111)
Creatinine, Ser: 0.95 mg/dL (ref 0.61–1.24)
GFR, Estimated: >60 mL/min (ref >60)
Glucose, Bld: 115 mg/dL — ABNORMAL HIGH (ref 70–99)
Potassium: 4.3 mmol/L (ref 3.5–5.1)
Sodium: 135 mmol/L (ref 135–145)

## 2022-09-29 ENCOUNTER — Ambulatory Visit: Payer: Medicare Other | Attending: Internal Medicine

## 2022-09-29 DIAGNOSIS — R0602 Shortness of breath: Secondary | ICD-10-CM | POA: Diagnosis not present

## 2022-09-29 DIAGNOSIS — I25119 Atherosclerotic heart disease of native coronary artery with unspecified angina pectoris: Secondary | ICD-10-CM | POA: Insufficient documentation

## 2022-09-29 LAB — ECHOCARDIOGRAM COMPLETE
AR max vel: 2.23 cm2
AV Area VTI: 1.87 cm2
AV Area mean vel: 2.22 cm2
AV Mean grad: 4 mmHg
AV Peak grad: 8.4 mmHg
Ao pk vel: 1.45 m/s
Area-P 1/2: 4.86 cm2
S' Lateral: 3.4 cm
Single Plane A2C EF: 54.3 %

## 2022-09-29 MED ORDER — PERFLUTREN LIPID MICROSPHERE
1.0000 mL | INTRAVENOUS | Status: AC | PRN
  Administered 2022-09-29: 2 mL via INTRAVENOUS

## 2022-09-30 ENCOUNTER — Encounter: Payer: Self-pay | Admitting: Internal Medicine

## 2022-10-01 ENCOUNTER — Ambulatory Visit
Admission: RE | Admit: 2022-10-01 | Discharge: 2022-10-01 | Disposition: A | Discharge status: Home / Self Care | Payer: Medicare Other | Source: Ambulatory Visit | Attending: Internal Medicine | Admitting: Internal Medicine

## 2022-10-01 DIAGNOSIS — R9389 Abnormal findings on diagnostic imaging of other specified body structures: Secondary | ICD-10-CM

## 2022-10-01 MED ORDER — IOHEXOL 300 MG/ML  SOLN
75.0000 mL | Freq: Once | INTRAMUSCULAR | Status: AC | PRN
  Administered 2022-10-01: 75 mL via INTRAVENOUS

## 2022-10-15 ENCOUNTER — Other Ambulatory Visit
Admission: RE | Admit: 2022-10-15 | Discharge: 2022-10-15 | Disposition: A | Discharge status: Home / Self Care | Payer: Medicare Other | Attending: Internal Medicine | Admitting: Internal Medicine

## 2022-10-15 ENCOUNTER — Other Ambulatory Visit: Payer: Self-pay | Admitting: Family Medicine

## 2022-10-15 DIAGNOSIS — Z79899 Other long term (current) drug therapy: Secondary | ICD-10-CM | POA: Diagnosis present

## 2022-10-15 DIAGNOSIS — I25119 Atherosclerotic heart disease of native coronary artery with unspecified angina pectoris: Secondary | ICD-10-CM | POA: Diagnosis present

## 2022-10-15 DIAGNOSIS — E1169 Type 2 diabetes mellitus with other specified complication: Secondary | ICD-10-CM

## 2022-10-15 LAB — LIPID PANEL
Cholesterol: 97 mg/dL (ref 0–200)
HDL: 34 mg/dL — ABNORMAL LOW (ref >40)
LDL Cholesterol: 52 mg/dL (ref 0–99)
Total CHOL/HDL Ratio: 2.9 RATIO
Triglycerides: 53 mg/dL (ref <150)
VLDL: 11 mg/dL (ref 0–40)

## 2022-10-15 LAB — COMPREHENSIVE METABOLIC PANEL
ALT: 27 U/L (ref 0–44)
AST: 32 U/L (ref 15–41)
Albumin: 4 g/dL (ref 3.5–5.0)
Alkaline Phosphatase: 56 U/L (ref 38–126)
Anion gap: 9 (ref 5–15)
BUN: 11 mg/dL (ref 8–23)
CO2: 24 mmol/L (ref 22–32)
Calcium: 8.8 mg/dL — ABNORMAL LOW (ref 8.9–10.3)
Chloride: 102 mmol/L (ref 98–111)
Creatinine, Ser: 0.87 mg/dL (ref 0.61–1.24)
GFR, Estimated: >60 mL/min (ref >60)
Glucose, Bld: 183 mg/dL — ABNORMAL HIGH (ref 70–99)
Potassium: 4 mmol/L (ref 3.5–5.1)
Sodium: 135 mmol/L (ref 135–145)
Total Bilirubin: 0.6 mg/dL (ref 0.3–1.2)
Total Protein: 7.2 g/dL (ref 6.5–8.1)

## 2022-10-15 LAB — CBC
HCT: 40.8 % (ref 39.0–52.0)
Hemoglobin: 13.9 g/dL (ref 13.0–17.0)
MCH: 29.8 pg (ref 26.0–34.0)
MCHC: 34.1 g/dL (ref 30.0–36.0)
MCV: 87.6 fL (ref 80.0–100.0)
Platelets: 191 10*3/uL (ref 150–400)
RBC: 4.66 MIL/uL (ref 4.22–5.81)
RDW: 12.3 % (ref 11.5–15.5)
WBC: 9.4 10*3/uL (ref 4.0–10.5)
nRBC: 0 % (ref 0.0–0.2)

## 2022-10-15 NOTE — Telephone Encounter (Signed)
LVM for patient tcb and reschedule

## 2022-10-15 NOTE — Telephone Encounter (Signed)
E-scribed refill.  Plz schedule Welcome to CPE and lab visits for additional refills.

## 2022-10-16 ENCOUNTER — Telehealth: Payer: Self-pay | Admitting: Internal Medicine

## 2022-10-16 NOTE — Telephone Encounter (Signed)
Pt made aware of lab results along with MD's recommendations Pt verbalized understanding.

## 2022-10-16 NOTE — Telephone Encounter (Signed)
Pt states that he is returning [place], RN call and is requesting return call.

## 2022-10-17 ENCOUNTER — Encounter: Payer: Self-pay | Admitting: Internal Medicine

## 2022-10-17 ENCOUNTER — Ambulatory Visit: Payer: Medicare Other | Attending: Internal Medicine | Admitting: Internal Medicine

## 2022-10-17 VITALS — BP 138/72 | HR 53 | Ht 69.0 in | Wt 221.0 lb

## 2022-10-17 DIAGNOSIS — I10 Essential (primary) hypertension: Secondary | ICD-10-CM | POA: Diagnosis not present

## 2022-10-17 DIAGNOSIS — E1169 Type 2 diabetes mellitus with other specified complication: Secondary | ICD-10-CM | POA: Diagnosis not present

## 2022-10-17 DIAGNOSIS — I25118 Atherosclerotic heart disease of native coronary artery with other forms of angina pectoris: Secondary | ICD-10-CM | POA: Diagnosis not present

## 2022-10-17 DIAGNOSIS — E785 Hyperlipidemia, unspecified: Secondary | ICD-10-CM

## 2022-10-17 MED ORDER — AMLODIPINE BESYLATE 2.5 MG PO TABS: 2.5000 mg | ORAL_TABLET | Freq: Every day | ORAL | 3 refills | Status: DC

## 2022-10-17 NOTE — Patient Instructions (Signed)
Medication Instructions:  Your physician recommends the following medication changes.  START TAKING: Amlodipine 2.5 mg by mouth daily  *If you need a refill on your cardiac medications before your next appointment, please call your pharmacy*   Lab Work: None ordered today   Testing/Procedures: None ordered today   Follow-Up: At [facility], you and your health needs are our priority.  As part of our continuing mission to provide you with exceptional heart care, we have created designated Provider Care Teams.  These Care Teams include your primary Cardiologist (physician) and Advanced Practice Providers (APPs -  Physician Assistants and Nurse Practitioners) who all work together to provide you with the care you need, when you need it.  We recommend signing up for the patient portal called "MyChart".  Sign up information is provided on this After Visit Summary.  MyChart is used to connect with patients for Virtual Visits (Telemedicine).  Patients are able to view lab/test results, encounter notes, upcoming appointments, etc.  Non-urgent messages can be sent to your provider as well.   To learn more about what you can do with MyChart, go to [email or web address].    Your next appointment:   3 month(s)  Provider:   You may see Christopher End, MD or one of the following Advanced Practice Providers on your designated Care Team:   Christopher Berge, NP Ryan Dunn, PA-C Cadence Furth, PA-C Sheri Hammock, NP     

## 2022-10-17 NOTE — Progress Notes (Signed)
Follow-up Outpatient Visit Date: 10/17/2022  Primary Care Provider: Ria Bush, MD [place] [address]  Chief Complaint: Follow-up CAD  HPI:  [name] is a 67 y.o. male with history of coronary artery disease status post bare-metal stents to the LAD and RCA in 2002 as well as angioplasty to the LCx, hypertension, hyperlipidemia, type 2 diabetes mellitus, GERD, and obstructive sleep apnea on CPAP, who presents for follow-up of coronary artery disease.  I last saw him in early December, at which time he was concerned about progression of his CAD, noting that his shortness of breath was a little bit worse than at prior visits.  We agreed to perform an exercise myocardial perfusion stress test, which was low risk with a small in size, moderate in severity, partially reversible defect involving the apical anterior and apical segments most consistent with scar and peri-infarct ischemia.  Faint peripheral opacity in the right lower lobe was also noted, prompting [place] to perform a dedicated chest CT.  This showed mild linear opacities in the peripheral right lower lobe and area of consolidation previously seen on stress test suggestive of scarring related to prior infection or aspiration.  Echocardiogram last month showed normal LVEF and diastolic parameters.  RV was mildly enlarged.  No significant valvular abnormality was seen.  Today, [name] reports that he is feeling fairly well.  He still notes some shortness of breath at times, though it is better than at our last visit and is stable compared to a year ago.  He denies chest pain, palpitations, lightheadedness, and edema.  Home BP is usually 120-135/70-80.  --------------------------------------------------------------------------------------------------  Cardiovascular History & Procedures: Cardiovascular Problems: CAD s/p PCI & PTCA   Risk Factors: Known CAD, hypertension, hyperlipidemia, diabetes mellitus,  male gender, and age > 39   Cath/PCI: PCI (05/05/01): PCI to mid RCA with NIR 3.0 x 13 mm BMS (post-dilated with 3.25 mm balloon). POBA to mid LCx with 2.25 mm cutting balloon. PCI (05/03/01): PCI to proximal LAD CTO with Pixel 2.5 x 23 mm BMS (post-dilated with 3.0 mm balloon); POBA to mid LAD stenosis with 2.25 mm balloon LHC (04/30/01): LMCA normal. LAD with CTO of proximal vessel.  LCx with large OM1 followed by 90% stenosis in small AV groove LCx.  Dominant RCA with 90% midvessel stenosis.  Preserved LVEF with mid/apical anterior akinesis.   CV Surgery: None   EP Procedures and Devices: None    Non-Invasive Evaluation(s): TTE (09/29/2022): Normal LV size and wall thickness.  LVEF 60-65% with normal wall motion and diastolic function.  Mildly dilated RV with normal function.  Mild left atrial enlargement.  No pericardial effusion.  No significant valvular abnormality seen.  Normal CVP. Exercise MPI (09/17/2022): Low risk study.  Small in size, moderate in severity, partially reversible defect involving the apical anterior and apical segments most consistent with small area of scar and peri-infarct ischemia noted.  LVEF 55 to 65%.  Faint peripheral opacity in the right lower lobe noted; dedicated chest CT recommended. TTE (12/13/2019): Borderline LV enlargement and hypertrophy with LVEF 55-60%.  Normal diastolic function and wall motion.  Normal RV size and function.  Mild right atrial enlargement.  Mild mitral regurgitation. Exercise myocardial perfusion stress test (12/23/05): Fair exercise capacity without evidence of ischemia or scar. LVEF 53%.  Recent CV Pertinent Labs: Lab Results  Component Value Date   CHOL 97 10/15/2022   CHOL 113 08/16/2021   HDL 34 (L) 10/15/2022   HDL 31 (  L) 08/16/2021   LDLCALC 52 10/15/2022   LDLCALC 59 08/16/2021   LDLDIRECT 70.8 06/30/2012   TRIG 53 10/15/2022   CHOLHDL 2.9 10/15/2022   K 4.0 10/15/2022   MG 1.9 10/20/2016   BUN 11 10/15/2022   BUN 11  08/16/2021   CREATININE 0.87 10/15/2022   CREATININE 0.92 02/15/2021    Past medical and surgical history were reviewed and updated in EPIC.  Current Meds  Medication Sig   aspirin EC 81 MG tablet Take 1 tablet (81 mg total) by mouth daily.   atorvastatin (LIPITOR) 20 MG tablet TAKE 1 TABLET BY MOUTH EVERY DAY   Blood Glucose Monitoring Suppl (ONE TOUCH ULTRA 2) w/Device KIT Use as directed E11.65   glucose blood (ONETOUCH ULTRA) test strip Check sugar once daily as needed DX E11.69   metFORMIN (GLUCOPHAGE-XR) 500 MG 24 hr tablet Take 1 tablet (500 mg total) by mouth in the morning AND 2 tablets (1,000 mg total) at bedtime.   NEXIUM 40 MG capsule Take 1 capsule (40 mg total) by mouth daily at 12 noon.   nitroGLYCERIN (NITROSTAT) 0.4 MG SL tablet PLACE 1 TABLET UNDER THE TONGUE EVERY 5 MINUTES AS NEEDED FOR CHEST PAIN.   OneTouch Delica Lancets 20U MISC 1 each by Other route 2 (two) times daily as needed.   sildenafil (VIAGRA) 100 MG tablet Take 0.5 tablets (50 mg total) by mouth daily as needed for erectile dysfunction.    Allergies: Patient has no known allergies.  Social History   Tobacco Use   Smoking status: Former    Packs/day: 1.00    Years: 15.00    Total pack years: 15.00    Types: Cigarettes    Quit date: 09/15/2000    Years since quitting: 22.1   Smokeless tobacco: Never  Vaping Use   Vaping Use: Never used  Substance Use Topics   Alcohol use: No   Drug use: No    Family History  Problem Relation Age of Onset   Lung cancer Father 52       smoker   Hypertension Mother    Diabetes Neg Hx    Coronary artery disease Neg Hx    Stroke Neg Hx    Colon cancer Neg Hx    Esophageal cancer Neg Hx    Rectal cancer Neg Hx    Stomach cancer Neg Hx     Review of Systems: A 12-system review of systems was performed and was negative except as noted in the  HPI.  --------------------------------------------------------------------------------------------------  Physical Exam: BP 138/72 (BP Location: Left Arm, Patient Position: Sitting, Cuff Size: Normal)   Pulse (!) 53   Ht '5\' 9"'$  (1.753 m)   Wt 221 lb (100.2 kg)   SpO2 97%   BMI 32.64 kg/m   General:  NAD. Neck: No JVD or HJR. Lungs: Clear to auscultation bilaterally without wheezes or crackles. Heart: Bradycardic but regular without murmurs, rubs, or gallops. Abdomen: Soft, nontender, nondistended. Extremities: No lower extremity edema.   Lab Results  Component Value Date   WBC 9.4 10/15/2022   HGB 13.9 10/15/2022   HCT 40.8 10/15/2022   MCV 87.6 10/15/2022   PLT 191 10/15/2022    Lab Results  Component Value Date   NA 135 10/15/2022   K 4.0 10/15/2022   CL 102 10/15/2022   CO2 24 10/15/2022   BUN 11 10/15/2022   CREATININE 0.87 10/15/2022   GLUCOSE 183 (H) 10/15/2022   ALT 27 10/15/2022    Lab  Results  Component Value Date   CHOL 97 10/15/2022   HDL 34 (L) 10/15/2022   LDLCALC 52 10/15/2022   LDLDIRECT 70.8 06/30/2012   TRIG 53 10/15/2022   CHOLHDL 2.9 10/15/2022    --------------------------------------------------------------------------------------------------  ASSESSMENT AND PLAN: Coronary artery disease with stable angina: No angina reported; shortness of breath improved since last visit and back to baseline.  Query if shortness of breath is an anginal equivalent.  We have reviewed results of recent MPI and echo and have agreed to add amlodipine 2.5 mg daily for antianginal therapy and BP control (bradycardia and sildenafil use preclude use of beta-blockers and long-acting nitrates, respectively)  Continue secondary prevention with aspirin and atorvastatin.  Hypertension: BP above goal today.  We will add amlodipine 2.5 mg daily for target BP < 130/80.  Sodium restriction encouraged.  Hyperlipidemia associated with type 2 diabetes mellitus: LDL  well-controlled on labs 2 days ago.  Continue atorvastatin 20 mg daily.  Ongoing management of DM per Dr. Danise Mina.  Follow-up: Return to clinic in 3 months.  Nelva Bush, MD 10/17/2022 9:24 AM

## 2022-10-23 ENCOUNTER — Ambulatory Visit (AMBULATORY_SURGERY_CENTER): Payer: Medicare Other | Admitting: *Deleted

## 2022-10-23 VITALS — Ht 69.0 in | Wt 215.0 lb

## 2022-10-23 DIAGNOSIS — Z86010 Personal history of colonic polyps: Secondary | ICD-10-CM

## 2022-10-23 MED ORDER — NA SULFATE-K SULFATE-MG SULF 17.5-3.13-1.6 GM/177ML PO SOLN: 1.0000 | Freq: Once | ORAL | 0 refills | Status: AC

## 2022-10-23 NOTE — Progress Notes (Signed)
Pt states he has never had to take a nitro  Pt's previsit is done over the phone and all paperwork (prep instructions) sent to patient.  Pt's name and DOB verified at the beginning of the previsit.  Pt denies any difficulty with ambulating.   No egg or soy allergy known to patient  No issues known to pt with past sedation with any surgeries or procedures Patient denies ever being told they had issues or difficulty with intubation  No FH of Malignant Hyperthermia Pt is not on diet pills Pt is not on  home 02  Pt is not on blood thinners  Pt denies issues with constipation  Pt is not on dialysis Pt denies any upcoming cardiac testing Pt encouraged to use to use Singlecare or Goodrx to reduce cost  Patient's chart reviewed by Osvaldo Angst CNRA prior to previsit and patient appropriate for the [facility].  Previsit completed and red dot placed by patient's name on their procedure day (on provider's schedule).

## 2022-10-28 ENCOUNTER — Encounter: Payer: Self-pay | Admitting: Family Medicine

## 2022-10-28 ENCOUNTER — Ambulatory Visit (INDEPENDENT_AMBULATORY_CARE_PROVIDER_SITE_OTHER): Payer: Medicare Other | Admitting: Family Medicine

## 2022-10-28 ENCOUNTER — Encounter: Payer: Self-pay | Admitting: *Deleted

## 2022-10-28 VITALS — BP 138/80 | HR 65 | Temp 97.6°F | Ht 68.0 in | Wt 217.2 lb

## 2022-10-28 DIAGNOSIS — E669 Obesity, unspecified: Secondary | ICD-10-CM

## 2022-10-28 DIAGNOSIS — Z125 Encounter for screening for malignant neoplasm of prostate: Secondary | ICD-10-CM

## 2022-10-28 DIAGNOSIS — Z87891 Personal history of nicotine dependence: Secondary | ICD-10-CM

## 2022-10-28 DIAGNOSIS — D126 Benign neoplasm of colon, unspecified: Secondary | ICD-10-CM

## 2022-10-28 DIAGNOSIS — E1169 Type 2 diabetes mellitus with other specified complication: Secondary | ICD-10-CM

## 2022-10-28 DIAGNOSIS — Z136 Encounter for screening for cardiovascular disorders: Secondary | ICD-10-CM | POA: Diagnosis not present

## 2022-10-28 DIAGNOSIS — E118 Type 2 diabetes mellitus with unspecified complications: Secondary | ICD-10-CM

## 2022-10-28 DIAGNOSIS — Z23 Encounter for immunization: Secondary | ICD-10-CM

## 2022-10-28 DIAGNOSIS — N529 Male erectile dysfunction, unspecified: Secondary | ICD-10-CM

## 2022-10-28 DIAGNOSIS — Z0000 Encounter for general adult medical examination without abnormal findings: Secondary | ICD-10-CM | POA: Diagnosis not present

## 2022-10-28 DIAGNOSIS — K219 Gastro-esophageal reflux disease without esophagitis: Secondary | ICD-10-CM

## 2022-10-28 DIAGNOSIS — E785 Hyperlipidemia, unspecified: Secondary | ICD-10-CM

## 2022-10-28 DIAGNOSIS — I10 Essential (primary) hypertension: Secondary | ICD-10-CM

## 2022-10-28 DIAGNOSIS — I25118 Atherosclerotic heart disease of native coronary artery with other forms of angina pectoris: Secondary | ICD-10-CM

## 2022-10-28 DIAGNOSIS — E66811 Obesity, class 1: Secondary | ICD-10-CM

## 2022-10-28 DIAGNOSIS — G4733 Obstructive sleep apnea (adult) (pediatric): Secondary | ICD-10-CM

## 2022-10-28 DIAGNOSIS — Z7189 Other specified counseling: Secondary | ICD-10-CM

## 2022-10-28 LAB — HEMOGLOBIN A1C: Hgb A1c MFr Bld: 8.2 % — ABNORMAL HIGH (ref 4.6–6.5)

## 2022-10-28 LAB — PSA, MEDICARE: PSA: 0.54 ng/ml (ref 0.10–4.00)

## 2022-10-28 MED ORDER — AMLODIPINE BESYLATE 5 MG PO TABS: 5.0000 mg | ORAL_TABLET | Freq: Every day | ORAL | 3 refills | Status: DC

## 2022-10-28 MED ORDER — ACCU-CHEK GUIDE VI STRP: ORAL_STRIP | 12 refills | Status: DC

## 2022-10-28 MED ORDER — SILDENAFIL CITRATE 100 MG PO TABS: 50.0000 mg | ORAL_TABLET | Freq: Every day | ORAL | 3 refills | Status: AC | PRN

## 2022-10-28 MED ORDER — ACCU-CHEK SOFTCLIX LANCETS MISC: 12 refills | Status: AC

## 2022-10-28 MED ORDER — METFORMIN HCL ER 500 MG PO TB24: 1000.0000 mg | ORAL_TABLET | Freq: Two times a day (BID) | ORAL | 4 refills | Status: DC

## 2022-10-28 MED ORDER — ATORVASTATIN CALCIUM 20 MG PO TABS: 20.0000 mg | ORAL_TABLET | Freq: Every day | ORAL | 4 refills | Status: DC

## 2022-10-28 NOTE — Patient Instructions (Addendum)
Labs today  Prevnar-20 today  Bring [place] copy of your living will.  We will refer you for abdominal aortic aneurysm screening ultrasound in [place].  Increase amlodipine (Norvasc) to 59m daily - new dose sent to pharmacy.  Good to see you today.  Return as needed or in 3-4 months for diabetes check.

## 2022-10-28 NOTE — Assessment & Plan Note (Signed)
Continues OSA on CPAP - followed by pulm.

## 2022-10-28 NOTE — Assessment & Plan Note (Signed)
Chronic, overall well controlled on atorvastatin 58m daily - continue. The ASCVD Risk score (Arnett DK, et al., 2019) failed to calculate for the following reasons:   The valid total cholesterol range is 130 to 320 mg/dL

## 2022-10-28 NOTE — Assessment & Plan Note (Signed)
Stable period on nexium 11m daily - continue.

## 2022-10-28 NOTE — Assessment & Plan Note (Addendum)
Update A1c today. He notes worsened sugar control since he stopped jardiance (no longer covered by [employer]). He has recently increased metformin XR to 1052m BID - will continue this. RTC 3-4 mo DM f/u visit. Pt agrees with plan.

## 2022-10-28 NOTE — Assessment & Plan Note (Signed)
Continue to encourage healthy diet and lifestyle choices to affect sustainable weight loss.  °

## 2022-10-28 NOTE — Assessment & Plan Note (Signed)
Chronic, remains mildly elevated - will try higher amlodipine dose at 47m daily - new dose sent to pharmacy. Anticipate deteriorated control since he stopped jardiance.

## 2022-10-28 NOTE — Assessment & Plan Note (Signed)

## 2022-10-28 NOTE — Assessment & Plan Note (Addendum)
Viagra refilled. Pt knows to not take anywhere near SL nitro use.

## 2022-10-28 NOTE — Assessment & Plan Note (Signed)
Quit 2002- 50PY hx - check AAA screen.

## 2022-10-28 NOTE — Assessment & Plan Note (Signed)
Advanced directive - discussed. Has this at home. HCPOA would be wife Nenita. Asked to bring [place] a copy.

## 2022-10-28 NOTE — Progress Notes (Signed)
Patient ID: [name], male    DOB: 1956-05-22, 67 y.o.   MRN: [id number]  This visit was conducted in person.  BP 138/80 (BP Location: Right Arm, Cuff Size: Large)   Pulse 65   Temp 97.6 F (36.4 C) (Temporal)   Ht 5' 8"$  (1.727 m)   Wt 217 lb 4 oz (98.5 kg)   SpO2 94%   BMI 33.03 kg/m   153/93, then 142/89 on home cuff today  145/79 this morning with home cuff BP Readings from Last 3 Encounters:  10/28/22 138/80  10/17/22 138/72  08/21/22 122/80    CC: welcome to medicare visit  Subjective:   HPI: [name] is a 67 y.o. male presenting on 10/28/2022 for Welcome to Medicare Exam (Pt brought in home BP monitor to compare. Reading in office today- 153/93. Also, pt wants to discuss metformin dose. )   Hearing Screening   500Hz$  1000Hz$  2000Hz$  4000Hz$   Right ear 40 40 20 40  Left ear 25 20 20 25   $ Vision Screening   Right eye Left eye Both eyes  Without correction     With correction 20/50 20/30 20/30 $  Comments: Pt has cataracts.   [place] Office Visit from 09/13/2021 in [place] at [facility]  PHQ-2 Total Score 1           No data to display          OSA on CPAP through Dr Annamaria Boots, [place].   CAD s/p BMS to LAD and RCA 2002 and angioplasty to LCx - sees Dr End. Recent exercise stress test showed low risk study - started on amlodipine 2.20m daily. Myoperfusion imaging showed faint peripheral opacity to RLL - rec dedicated chest CT. Chest CT thought to be scarring.    DM - continues metformin XR 500/1000mg bid. He stopped jardiance 249mdue to cost  so he bumped up metformin XR 100021mID and tolerating well. mild loose stools. He is checking sugars more regularly - 140 fasting today   Preventative: COLONOSCOPY Date: 06/2012 15 polyps - adenomatous, mild diverticulosis, rpt 1 yr (Pyrtle) - rpt scheduled next month (Pyrtle) Prostate cancer screening - previously saw urology ([facility]). Yearly PSA.  Lung cancer screening CT -  not eligible Flu shot - yearly  COVID vaccine - declined Tetanus 2012  RSV - to consider  Pneumovax - 2017, [id number] Shingrix - 11/2020, 03/2021  Advanced directive - discussed. Has this at home. HCPOA would be wife [name]. Asked to bring us [place].  Seat belt use discussed  Sunscreen use discussed, no changing moles on skin.  Ex-smoker quit 2002, prior ~ [age] hx Alcohol - occasional  Dentist q6 mo  Eye exam - yearly Bowel - no constipation  Bladder - no incontinence   Caffeine: 1 pepsi/day Lives with wife, 1 dog, 2 cats.  Grown daughter. Occupation: mecDealeru: HS Activity: work 12 hour days, gardens and fishes, has started walking regularly Diet: good water daily, fruits/vegetables daily     Relevant past medical, surgical, family and social history reviewed and updated as indicated. Interim medical history since our last visit reviewed. Allergies and medications reviewed and updated. Outpatient Medications Prior to Visit  Medication Sig Dispense Refill   aspirin EC 81 MG tablet Take 1 tablet (81 mg total) by mouth daily. 90 tablet 3   NEXIUM 40 MG capsule Take 1 capsule (40 mg total) by mouth daily at 12 noon. 90 capsule  3   nitroGLYCERIN (NITROSTAT) 0.4 MG SL tablet PLACE 1 TABLET UNDER THE TONGUE EVERY 5 MINUTES AS NEEDED FOR CHEST PAIN. 25 tablet 3   amLODipine (NORVASC) 2.5 MG tablet Take 1 tablet (2.5 mg total) by mouth daily. 90 tablet 3   atorvastatin (LIPITOR) 20 MG tablet TAKE 1 TABLET BY MOUTH EVERY DAY 90 tablet 0   Blood Glucose Monitoring Suppl (ONE TOUCH ULTRA 2) w/Device KIT Use as directed E11.65 1 kit 0   glucose blood (ONETOUCH ULTRA) test strip Check sugar once daily as needed DX E11.69 100 strip 3   metFORMIN (GLUCOPHAGE-XR) 500 MG 24 hr tablet Take 1 tablet (500 mg total) by mouth in the morning AND 2 tablets (1,000 mg total) at bedtime. 270 tablet 3   OneTouch Delica Lancets 99991111 MISC 1 each by Other route 2 (two) times daily as needed. 100 each 3    sildenafil (VIAGRA) 100 MG tablet Take 0.5 tablets (50 mg total) by mouth daily as needed for erectile dysfunction. 30 tablet 3   No facility-administered medications prior to visit.     Per HPI unless specifically indicated in ROS section below Review of Systems  Objective:  BP 138/80 (BP Location: Right Arm, Cuff Size: Large)   Pulse 65   Temp 97.6 F (36.4 C) (Temporal)   Ht 5' 8"$  (1.727 m)   Wt 217 lb 4 oz (98.5 kg)   SpO2 94%   BMI 33.03 kg/m   Wt Readings from Last 3 Encounters:  10/28/22 217 lb 4 oz (98.5 kg)  10/23/22 215 lb (97.5 kg)  10/17/22 221 lb (100.2 kg)      Physical Exam Vitals and nursing note reviewed.  Constitutional:      General: He is not in acute distress.    Appearance: Normal appearance. He is well-developed. He is not ill-appearing.  HENT:     Head: Normocephalic and atraumatic.     Right Ear: Hearing, tympanic membrane, ear canal and external ear normal.     Left Ear: Hearing, tympanic membrane, ear canal and external ear normal.     Mouth/Throat:     Comments: Wearing mask Eyes:     General: No scleral icterus.    Extraocular Movements: Extraocular movements intact.     Conjunctiva/sclera: Conjunctivae normal.     Pupils: Pupils are equal, round, and reactive to light.  Neck:     Thyroid: No thyroid mass or thyromegaly.     Vascular: No carotid bruit.  Cardiovascular:     Rate and Rhythm: Normal rate and regular rhythm.     Pulses: Normal pulses.          Radial pulses are 2+ on the right side and 2+ on the left side.     Heart sounds: Normal heart sounds. No murmur heard. Pulmonary:     Effort: Pulmonary effort is normal. No respiratory distress.     Breath sounds: Normal breath sounds. No wheezing, rhonchi or rales.  Abdominal:     General: Bowel sounds are normal. There is no distension.     Palpations: Abdomen is soft. There is no mass.     Tenderness: There is no abdominal tenderness. There is no guarding or rebound.      Hernia: No hernia is present.  Musculoskeletal:        General: Normal range of motion.     Cervical back: Normal range of motion and neck supple.     Right lower leg: No edema.  Left lower leg: No edema.  Lymphadenopathy:     Cervical: No cervical adenopathy.  Skin:    General: Skin is warm and dry.     Findings: No rash.  Neurological:     General: No focal deficit present.     Mental Status: He is alert and oriented to person, place, and time.     Comments:  Recall 2/3, 3/3 with cue Calculation 5/5 DLROW  Psychiatric:        Mood and Affect: Mood normal.        Behavior: Behavior normal.        Thought Content: Thought content normal.        Judgment: Judgment normal.       Results for orders placed or performed during the hospital encounter of 10/15/22  Lipid panel  Result Value Ref Range   Cholesterol 97 0 - 200 mg/dL   Triglycerides 53 <150 mg/dL   HDL 34 (L) >40 mg/dL   Total CHOL/HDL Ratio 2.9 RATIO   VLDL 11 0 - 40 mg/dL   LDL Cholesterol 52 0 - 99 mg/dL  Comprehensive metabolic panel  Result Value Ref Range   Sodium 135 135 - 145 mmol/L   Potassium 4.0 3.5 - 5.1 mmol/L   Chloride 102 98 - 111 mmol/L   CO2 24 22 - 32 mmol/L   Glucose, Bld 183 (H) 70 - 99 mg/dL   BUN 11 8 - 23 mg/dL   Creatinine, Ser 0.87 0.61 - 1.24 mg/dL   Calcium 8.8 (L) 8.9 - 10.3 mg/dL   Total Protein 7.2 6.5 - 8.1 g/dL   Albumin 4.0 3.5 - 5.0 g/dL   AST 32 15 - 41 U/L   ALT 27 0 - 44 U/L   Alkaline Phosphatase 56 38 - 126 U/L   Total Bilirubin 0.6 0.3 - 1.2 mg/dL   GFR, Estimated >60 >60 mL/min   Anion gap 9 5 - 15  CBC  Result Value Ref Range   WBC 9.4 4.0 - 10.5 K/uL   RBC 4.66 4.22 - 5.81 MIL/uL   Hemoglobin 13.9 13.0 - 17.0 g/dL   HCT 40.8 39.0 - 52.0 %   MCV 87.6 80.0 - 100.0 fL   MCH 29.8 26.0 - 34.0 pg   MCHC 34.1 30.0 - 36.0 g/dL   RDW 12.3 11.5 - 15.5 %   Platelets 191 150 - 400 K/uL   nRBC 0.0 0.0 - 0.2 %   EKG - recently done by cardiology  05/2022  Assessment & Plan:   Problem List Items Addressed This Visit     Advanced directives, counseling/discussion (Chronic)    Advanced directive - discussed. Has this at home. HCPOA would be wife [name]. Asked to bring [place] a copy.       Welcome to Medicare preventive visit - Primary (Chronic)    I have personally reviewed the Medicare Annual Wellness questionnaire and have noted 1. The patient's medical and social history 2. Their use of alcohol, tobacco or illicit drugs 3. Their current medications and supplements 4. The patient's functional ability including ADL's, fall risks, home safety risks and hearing or visual impairment. Cognitive function has been assessed and addressed as indicated.  5. Diet and physical activity 6. Evidence for depression or mood disorders The patients weight, height, BMI have been recorded in the chart. I have made referrals, counseling and provided education to the patient based on review of the above and I have provided the pt with a written  personalized care plan for preventive services. Provider list updated.. See scanned questionairre as needed for further documentation. Reviewed preventative protocols and updated unless pt declined.       Hyperlipidemia LDL goal < 70 associated with type 2 diabetes mellitus (HCC)    Chronic, overall well controlled on atorvastatin 28m daily - continue. The ASCVD Risk score (Arnett DK, et al., 2019) failed to calculate for the following reasons:   The valid total cholesterol range is 130 to 320 mg/dL       Relevant Medications   atorvastatin (LIPITOR) 20 MG tablet   metFORMIN (GLUCOPHAGE-XR) 500 MG 24 hr tablet   sildenafil (VIAGRA) 100 MG tablet   amLODipine (NORVASC) 5 MG tablet   Obstructive sleep apnea    Continues OSA on CPAP - followed by pulm.       Gastroesophageal reflux disease without esophagitis    Stable period on nexium 471mdaily - continue.       Diabetes mellitus type 2 with  complications (HCC)    Update A1c today. He notes worsened sugar control since he stopped jardiance (no longer covered by [employer] He has recently increased metformin XR to 100063mID - will continue this. RTC 3-4 mo DM f/u visit. Pt agrees with plan.       Relevant Medications   atorvastatin (LIPITOR) 20 MG tablet   metFORMIN (GLUCOPHAGE-XR) 500 MG 24 hr tablet   Other Relevant Orders   Hemoglobin A1c   Colon polyps    Long overdue - has appt next month with [facility].       Obesity, Class I, BMI 30.0-34.9 (see actual BMI)    Continue to encourage healthy diet and lifestyle choices to affect sustainable weight loss.       Erectile dysfunction    Viagra refilled. Pt knows to not take anywhere near SL nitro use.       Essential hypertension    Chronic, remains mildly elevated - will try higher amlodipine dose at 5mg72mily - new dose sent to pharmacy. Anticipate deteriorated control since he stopped jardiance.       Relevant Medications   atorvastatin (LIPITOR) 20 MG tablet   sildenafil (VIAGRA) 100 MG tablet   amLODipine (NORVASC) 5 MG tablet   Coronary artery disease of native artery of native heart with stable angina pectoris (HCC)[place] Appreciate cardiac care. Sees Dr End, recent cardiac eval overall reassuring.       Relevant Medications   atorvastatin (LIPITOR) 20 MG tablet   sildenafil (VIAGRA) 100 MG tablet   amLODipine (NORVASC) 5 MG tablet   Ex-smoker    Quit 2002- 50PY hx - check AAA screen.       Relevant Orders   [place] MED[place] SCREENING   Other Visit Diagnoses     Encounter for abdominal aortic aneurysm (AAA) screening       Relevant Orders   [place] SCREENING   Special screening for malignant neoplasm of prostate       Relevant Orders   PSA, Medicare   Need for vaccination against Streptococcus pneumoniae       Relevant Orders   Pneumococcal conjugate vaccine 20-valent (Prevnar 20) (Completed)        Meds ordered this  encounter  Medications   atorvastatin (LIPITOR) 20 MG tablet    Sig: Take 1 tablet (20 mg total) by mouth daily.    Dispense:  90 tablet    Refill:  4   metFORMIN (  GLUCOPHAGE-XR) 500 MG 24 hr tablet    Sig: Take 2 tablets (1,000 mg total) by mouth 2 (two) times daily with a meal.    Dispense:  360 tablet    Refill:  4   sildenafil (VIAGRA) 100 MG tablet    Sig: Take 0.5 tablets (50 mg total) by mouth daily as needed for erectile dysfunction.    Dispense:  30 tablet    Refill:  3   glucose blood (ACCU-CHEK GUIDE) test strip    Sig: E11.69 Use as instructed to check sugars twice daily    Dispense:  100 each    Refill:  12   Accu-Chek Softclix Lancets lancets    Sig: E11.69 Use as instructed to check sugars twice daily    Dispense:  100 each    Refill:  12   amLODipine (NORVASC) 5 MG tablet    Sig: Take 1 tablet (5 mg total) by mouth daily.    Dispense:  90 tablet    Refill:  3    Orders Placed This Encounter  Procedures   [place] MEDICARE SCREENING    Standing Status:   Future    Standing Expiration Date:   10/29/2023    Order Specific Question:   Reason for Exam (SYMPTOM  OR DIAGNOSIS REQUIRED)    Answer:   AAA screen in smoker    Order Specific Question:   Preferred imaging location?    Answer:   [facility]   Pneumococcal conjugate vaccine 20-valent (Prevnar 20)   Hemoglobin A1c   PSA, Medicare    Patient Instructions  Labs today  [id number] today  Bring [place] copy of your living will.  We will refer you for abdominal aortic aneurysm screening ultrasound in [place].  Increase amlodipine (Norvasc) to 51m daily - new dose sent to pharmacy.  Good to see you today.  Return as needed or in 3-4 months for diabetes check.   Follow up plan: Return in about 3 months (around 01/26/2023) for follow up visit.  JRia Bush MD

## 2022-10-28 NOTE — Assessment & Plan Note (Signed)
Appreciate cardiac care. Sees Dr End, recent cardiac eval overall reassuring.

## 2022-10-28 NOTE — Assessment & Plan Note (Signed)
Long overdue - has appt next month with [facility].

## 2022-11-06 ENCOUNTER — Ambulatory Visit
Admission: RE | Admit: 2022-11-06 | Discharge: 2022-11-06 | Disposition: A | Discharge status: Home / Self Care | Payer: Medicare Other | Source: Ambulatory Visit | Attending: Family Medicine | Admitting: Family Medicine

## 2022-11-06 DIAGNOSIS — Z87891 Personal history of nicotine dependence: Secondary | ICD-10-CM | POA: Insufficient documentation

## 2022-11-06 DIAGNOSIS — Z136 Encounter for screening for cardiovascular disorders: Secondary | ICD-10-CM | POA: Insufficient documentation

## 2022-11-10 ENCOUNTER — Encounter: Payer: Self-pay | Admitting: Family Medicine

## 2022-11-10 DIAGNOSIS — K760 Fatty (change of) liver, not elsewhere classified: Secondary | ICD-10-CM | POA: Insufficient documentation

## 2022-11-10 DIAGNOSIS — I77811 Abdominal aortic ectasia: Secondary | ICD-10-CM | POA: Insufficient documentation

## 2022-11-14 ENCOUNTER — Other Ambulatory Visit (HOSPITAL_COMMUNITY): Payer: Self-pay

## 2022-11-14 HISTORY — PX: COLONOSCOPY: SHX174

## 2022-11-20 ENCOUNTER — Ambulatory Visit (AMBULATORY_SURGERY_CENTER): Payer: Medicare Other | Admitting: Internal Medicine

## 2022-11-20 ENCOUNTER — Encounter: Payer: Self-pay | Admitting: Internal Medicine

## 2022-11-20 VITALS — BP 119/67 | HR 53 | Temp 97.5°F | Resp 15 | Ht 69.0 in | Wt 215.0 lb

## 2022-11-20 DIAGNOSIS — Z86010 Personal history of colonic polyps: Secondary | ICD-10-CM | POA: Diagnosis not present

## 2022-11-20 DIAGNOSIS — Z09 Encounter for follow-up examination after completed treatment for conditions other than malignant neoplasm: Secondary | ICD-10-CM

## 2022-11-20 DIAGNOSIS — D123 Benign neoplasm of transverse colon: Secondary | ICD-10-CM | POA: Diagnosis not present

## 2022-11-20 DIAGNOSIS — D124 Benign neoplasm of descending colon: Secondary | ICD-10-CM

## 2022-11-20 DIAGNOSIS — D127 Benign neoplasm of rectosigmoid junction: Secondary | ICD-10-CM | POA: Diagnosis not present

## 2022-11-20 MED ORDER — SODIUM CHLORIDE 0.9 % IV SOLN: 500.0000 mL | Freq: Once | INTRAVENOUS | Status: DC

## 2022-11-20 NOTE — Progress Notes (Signed)
Called to room to assist during endoscopic procedure.  Patient ID and intended procedure confirmed with present staff. Received instructions for my participation in the procedure from the performing physician.  

## 2022-11-20 NOTE — Progress Notes (Signed)
GASTROENTEROLOGY PROCEDURE H&P NOTE   Primary Care Physician: Ria Bush, MD    Reason for Procedure:  History of colon polyps  Plan:    Colonoscopy  Patient is appropriate for endoscopic procedure(s) in the ambulatory ([facility]) setting.  The nature of the procedure, as well as the risks, benefits, and alternatives were carefully and thoroughly reviewed with the patient. Ample time for discussion and questions allowed. The patient understood, was satisfied, and agreed to proceed.     HPI: [name] is a 67 y.o. male who presents for surveillance colonoscopy.  Medical history as below.  Tolerated the prep.  No recent chest pain or shortness of breath.  No abdominal pain today.  Past Medical History:  Diagnosis Date   Cataract    Coronary atherosclerosis of unspecified type of vessel, native or graft 09/15/2000   s/p 3 vessel PCI   COVID-19 virus infection 02/15/2021   Diabetes type 2, controlled ([place])    did not return calls to schedule DSME   Esophageal reflux    Essential hypertension 10/20/2016   Ex-smoker    quit 2002   HLD (hyperlipidemia)    OSA (obstructive sleep apnea)     CPAP ([place])   Sleep apnea    Umbilical hernia     Past Surgical History:  Procedure Laterality Date   ANAL FISSURE REPAIR  2008   COLONOSCOPY  06/2012   15 polyps - adenomatous, mild diverticulosis rec rpt 1 yr (Suzannah Bettes)   CORONARY STENT PLACEMENT  04/2001   x2 (Dr Orene Desanctis)   [place] Right 2004   middle finger   SIGMOIDOSCOPY  1996   SINUS SURGERY WITH INSTATRAK  2001    Prior to Admission medications   Medication Sig Start Date End Date Taking? Authorizing Provider  Accu-Chek Softclix Lancets lancets E11.69 Use as instructed to check sugars twice daily 10/28/22  Yes Ria Bush, MD  amLODipine (NORVASC) 5 MG tablet Take 1 tablet (5 mg total) by mouth daily. 10/28/22  Yes Ria Bush, MD  aspirin EC 81 MG tablet Take 1 tablet (81 mg total) by mouth  daily. 07/09/15  Yes Imogene Burn, PA-C  atorvastatin (LIPITOR) 20 MG tablet Take 1 tablet (20 mg total) by mouth daily. 10/28/22  Yes Ria Bush, MD  glucose blood (ACCU-CHEK GUIDE) test strip E11.69 Use as instructed to check sugars twice daily 10/28/22  Yes Ria Bush, MD  metFORMIN (GLUCOPHAGE-XR) 500 MG 24 hr tablet Take 2 tablets (1,000 mg total) by mouth 2 (two) times daily with a meal. 10/28/22  Yes Ria Bush, MD  NEXIUM 40 MG capsule Take 1 capsule (40 mg total) by mouth daily at 12 noon. 08/21/22  Yes End, Harrell Gave, MD  nitroGLYCERIN (NITROSTAT) 0.4 MG SL tablet PLACE 1 TABLET UNDER THE TONGUE EVERY 5 MINUTES AS NEEDED FOR CHEST PAIN. 08/21/22   End, Harrell Gave, MD  sildenafil (VIAGRA) 100 MG tablet Take 0.5 tablets (50 mg total) by mouth daily as needed for erectile dysfunction. 10/28/22   Ria Bush, MD    Current Outpatient Medications  Medication Sig Dispense Refill   Accu-Chek Softclix Lancets lancets E11.69 Use as instructed to check sugars twice daily 100 each 12   amLODipine (NORVASC) 5 MG tablet Take 1 tablet (5 mg total) by mouth daily. 90 tablet 3   aspirin EC 81 MG tablet Take 1 tablet (81 mg total) by mouth daily. 90 tablet 3   atorvastatin (LIPITOR) 20 MG tablet Take 1 tablet (20 mg total)  by mouth daily. 90 tablet 4   glucose blood (ACCU-CHEK GUIDE) test strip E11.69 Use as instructed to check sugars twice daily 100 each 12   metFORMIN (GLUCOPHAGE-XR) 500 MG 24 hr tablet Take 2 tablets (1,000 mg total) by mouth 2 (two) times daily with a meal. 360 tablet 4   NEXIUM 40 MG capsule Take 1 capsule (40 mg total) by mouth daily at 12 noon. 90 capsule 3   nitroGLYCERIN (NITROSTAT) 0.4 MG SL tablet PLACE 1 TABLET UNDER THE TONGUE EVERY 5 MINUTES AS NEEDED FOR CHEST PAIN. 25 tablet 3   sildenafil (VIAGRA) 100 MG tablet Take 0.5 tablets (50 mg total) by mouth daily as needed for erectile dysfunction. 30 tablet 3   Current Facility-Administered  Medications  Medication Dose Route Frequency Provider Last Rate Last Admin   0.9 %  sodium chloride infusion  500 mL Intravenous Once Chayson Charters, Lajuan Lines, MD        Allergies as of 11/20/2022   (No Known Allergies)    Family History  Problem Relation Age of Onset   Lung cancer Father 61       smoker   Hypertension Mother    Diabetes Neg Hx    Coronary artery disease Neg Hx    Stroke Neg Hx    Colon cancer Neg Hx    Esophageal cancer Neg Hx    Rectal cancer Neg Hx    Stomach cancer Neg Hx     Social History   Socioeconomic History   Marital status: Married    Spouse name: Not on file   Number of children: Not on file   Years of education: Not on file   Highest education level: Not on file  Occupational History   Occupation: Therapist, occupational    Employer: [employer]  Tobacco Use   Smoking status: Former    Packs/day: 1.00    Years: 15.00    Total pack years: 15.00    Types: Cigarettes    Quit date: 09/15/2000    Years since quitting: 22.1   Smokeless tobacco: Never  Vaping Use   Vaping Use: Never used  Substance and Sexual Activity   Alcohol use: No   Drug use: No   Sexual activity: Not on file  Other Topics Concern   Not on file  Social History Narrative   Caffeine: 1 pepsi/day   Lives with wife, 1 dog, 2 cats. Grown daughter.   Occupation: Dealer   Edu: HS   Activity: work 12 hour days, gardens and fishes   Diet: good water daily, fruits/vegetables daily   Social Determinants of Radio broadcast assistant Strain: Not on file  Food Insecurity: Not on file  Transportation Needs: Not on file  Physical Activity: Not on file  Stress: Not on file  Social Connections: Not on file  Intimate Partner Violence: Not on file    Physical Exam: Vital signs in last 24 hours: '@BP'$  121/70   Pulse 72   Temp (!) 97.5 F (36.4 C)   Ht '5\' 9"'$  (1.753 m)   Wt 215 lb (97.5 kg)   SpO2 95%   BMI 31.75 kg/m  GEN: NAD EYE: Sclerae anicteric ENT: MMM CV:  Non-tachycardic Pulm: CTA b/l GI: Soft, NT/ND NEURO:  Alert & Oriented x 3   Zenovia Jarred, MD [facility]  11/20/2022 8:22 AM

## 2022-11-20 NOTE — Op Note (Signed)
[facility] Patient Name: [name] Procedure Date: 11/20/2022 8:20 AM MRN: [id number] Endoscopist: Jerene Bears , MD, [id number] Age: 67 Referring MD:  Date of Birth: 04-Aug-1956 Gender: Male Account #: [id number] Procedure:                Colonoscopy Indications:              High risk colon cancer surveillance: Personal                            history of multiple adenomas including a > 1 cm                            adenoma with villous component, Last colonoscopy:                            October 2013 (7 polyps = 1 TVA, 1 SSP, 5 TAs) Medicines:                Monitored Anesthesia Care Procedure:                Pre-Anesthesia Assessment:                           - Prior to the procedure, a History and Physical                            was performed, and patient medications and                            allergies were reviewed. The patient's tolerance of                            previous anesthesia was also reviewed. The risks                            and benefits of the procedure and the sedation                            options and risks were discussed with the patient.                            All questions were answered, and informed consent                            was obtained. Prior Anticoagulants: The patient has                            taken no anticoagulant or antiplatelet agents. ASA                            Grade Assessment: II - A patient with mild systemic                            disease. After reviewing the risks and benefits,  the patient was deemed in satisfactory condition to                            undergo the procedure.                           After obtaining informed consent, the colonoscope                            was passed under direct vision. Throughout the                            procedure, the patient's blood pressure, pulse, and                            oxygen saturations were  monitored continuously. The                            Olympus [id number] SN [id number] was introduced through                            the anus and advanced to the cecum, identified by                            the appendiceal orifice. The colonoscopy was                            performed without difficulty. The patient tolerated                            the procedure well. The quality of the bowel                            preparation was good. The ileocecal valve,                            appendiceal orifice, and rectum were photographed. Scope In: 8:30:23 AM Scope Out: 8:47:37 AM Scope Withdrawal Time: 0 hours 15 minutes 22 seconds  Total Procedure Duration: 0 hours 17 minutes 14 seconds  Findings:                 The digital rectal exam findings include anal                            stricture.                           Seven sessile polyps were found in the transverse                            colon. The polyps were 4 to 7 mm in size. These                            polyps were removed with a cold snare. Resection  and retrieval were complete.                           A 5 mm polyp was found in the descending colon. The                            polyp was sessile. The polyp was removed with a                            cold snare. Resection and retrieval were complete.                           A 7 mm polyp was found in the recto-sigmoid colon.                            The polyp was sessile. The polyp was removed with a                            cold snare. Resection and retrieval were complete.                           Multiple large-mouthed and small-mouthed                            diverticula were found in the sigmoid colon.                           Scarring in distal rectum near dentate line, query                            hemorrhoidal surgery in the past. No additional                            abnormalities were found on  retroflexion. Complications:            No immediate complications. Estimated Blood Loss:     Estimated blood loss was minimal. Impression:               - Anal stricture found on digital rectal exam.                           - Seven 4 to 7 mm polyps in the transverse colon,                            removed with a cold snare. Resected and retrieved.                           - One 5 mm polyp in the descending colon, removed                            with a cold snare. Resected and retrieved.                           -  One 7 mm polyp at the recto-sigmoid colon,                            removed with a cold snare. Resected and retrieved.                           - Moderate diverticulosis in the sigmoid colon. Recommendation:           - Patient has a contact number available for                            emergencies. The signs and symptoms of potential                            delayed complications were discussed with the                            patient. Return to normal activities tomorrow.                            Written discharge instructions were provided to the                            patient.                           - Resume previous diet.                           - Continue present medications.                           - Await pathology results.                           - Repeat colonoscopy is recommended for                            surveillance. The colonoscopy date will be                            determined after pathology results from today's                            exam become available for review. Jerene Bears, MD 11/20/2022 8:51:48 AM This report has been signed electronically.

## 2022-11-20 NOTE — Patient Instructions (Signed)
Handouts provided on polyps and diverticulosis.   Resume previous diet.  Continue present medications.  Await pathology results.  Repeat colonoscopy is recommended for surveillance. The colonoscopy date will be determined after pathology result from today's exam become available for review.   YOU HAD AN ENDOSCOPIC PROCEDURE TODAY AT [facility]:   Refer to the procedure report that was given to you for any specific questions about what was found during the examination.  If the procedure report does not answer your questions, please call your gastroenterologist to clarify.  If you requested that your care partner not be given the details of your procedure findings, then the procedure report has been included in a sealed envelope for you to review at your convenience later.  YOU SHOULD EXPECT: Some feelings of bloating in the abdomen. Passage of more gas than usual.  Walking can help get rid of the air that was put into your GI tract during the procedure and reduce the bloating. If you had a lower endoscopy (such as a colonoscopy or flexible sigmoidoscopy) you may notice spotting of blood in your stool or on the toilet paper. If you underwent a bowel prep for your procedure, you may not have a normal bowel movement for a few days.  Please Note:  You might notice some irritation and congestion in your nose or some drainage.  This is from the oxygen used during your procedure.  There is no need for concern and it should clear up in a day or so.  SYMPTOMS TO REPORT IMMEDIATELY:  Following lower endoscopy (colonoscopy or flexible sigmoidoscopy):  Excessive amounts of blood in the stool  Significant tenderness or worsening of abdominal pains  Swelling of the abdomen that is new, acute  Fever of 100F or higher  For urgent or emergent issues, a gastroenterologist can be reached at any hour by calling [phone]. Do not use MyChart messaging for urgent concerns.    DIET:  We do  recommend a small meal at first, but then you may proceed to your regular diet.  Drink plenty of fluids but you should avoid alcoholic beverages for 24 hours.  ACTIVITY:  You should plan to take it easy for the rest of today and you should NOT DRIVE or use heavy machinery until tomorrow (because of the sedation medicines used during the test).    FOLLOW UP: Our staff will call the number listed on your records the next business day following your procedure.  We will call around 7:15- 8:00 am to check on you and address any questions or concerns that you may have regarding the information given to you following your procedure. If we do not reach you, we will leave a message.     If any biopsies were taken you will be contacted by phone or by letter within the next 1-3 weeks.  Please call us at [phone] if you have not heard about the biopsies in 3 weeks.    SIGNATURES/CONFIDENTIALITY: You and/or your care partner have signed paperwork which will be entered into your electronic medical record.  These signatures attest to the fact that that the information above on your After Visit Summary has been reviewed and is understood.  Full responsibility of the confidentiality of this discharge information lies with you and/or your care-partner.

## 2022-11-20 NOTE — Progress Notes (Signed)
Report to PACU, RN, vss, BBS= Clear.  

## 2022-11-20 NOTE — Progress Notes (Signed)
Pt's states no medical or surgical changes since previsit or office visit. 

## 2022-11-21 ENCOUNTER — Telehealth: Payer: Self-pay

## 2022-11-21 ENCOUNTER — Other Ambulatory Visit (HOSPITAL_COMMUNITY): Payer: Self-pay

## 2022-11-21 NOTE — Telephone Encounter (Signed)
  Follow up Call-     11/20/2022    7:30 AM  Call back number  Post procedure Call Back phone  # [phone]  Permission to leave phone message Yes     Patient questions:  Do you have a fever, pain , or abdominal swelling? No. Pain Score  0 *  Have you tolerated food without any problems? Yes.    Have you been able to return to your normal activities? Yes.    Do you have any questions about your discharge instructions: Diet   No. Medications  No. Follow up visit  No.  Do you have questions or concerns about your Care? No.  Actions: * If pain score is 4 or above: No action needed, pain <4.

## 2022-11-25 ENCOUNTER — Encounter: Payer: Self-pay | Admitting: Internal Medicine

## 2023-01-15 ENCOUNTER — Encounter: Payer: Self-pay | Admitting: Internal Medicine

## 2023-01-15 ENCOUNTER — Ambulatory Visit: Payer: Medicare Other | Attending: Internal Medicine | Admitting: Internal Medicine

## 2023-01-15 VITALS — BP 122/60 | HR 66 | Ht 69.0 in | Wt 221.0 lb

## 2023-01-15 DIAGNOSIS — I25118 Atherosclerotic heart disease of native coronary artery with other forms of angina pectoris: Secondary | ICD-10-CM | POA: Insufficient documentation

## 2023-01-15 DIAGNOSIS — E1169 Type 2 diabetes mellitus with other specified complication: Secondary | ICD-10-CM | POA: Insufficient documentation

## 2023-01-15 DIAGNOSIS — E785 Hyperlipidemia, unspecified: Secondary | ICD-10-CM | POA: Insufficient documentation

## 2023-01-15 DIAGNOSIS — N529 Male erectile dysfunction, unspecified: Secondary | ICD-10-CM

## 2023-01-15 DIAGNOSIS — I10 Essential (primary) hypertension: Secondary | ICD-10-CM | POA: Diagnosis present

## 2023-01-15 NOTE — Progress Notes (Signed)
Follow-up Outpatient Visit Date: 01/15/2023  Primary Care Provider: Eustaquio Boyden, MD [address] [place] [address]  Chief Complaint: Follow-up coronary artery disease  HPI:  [name] is a 67 y.o. male with history of coronary artery disease status post bare-metal stents to the LAD and RCA in 2002 as well as angioplasty to the LCx, hypertension, hyperlipidemia, type 2 diabetes mellitus, GERD, and obstructive sleep apnea on CPAP, who presents for follow-up of coronary artery disease.  I last saw him in February, at which time he was feeling fairly well though he continued to note intermittent shortness of breath.  Preceding MPI was abnormal, probably low risk with a small in size, moderate in severity, partially reversible defect involving the apical and apical anterior segments.  Echo showed normal LVEF and diastolic parameters.  No significant valvular abnormality was seen.  We agreed to add amlodipine 2.5 mg daily for improved blood pressure control and antianginal therapy.  This was increased to 5 mg daily at his last visit with Dr. Sharen Hones due to persistent hypertension.  Today, [name] reports that he is feeling quite well, denying chest pain, shortness of breath, palpitattions, lightheadedness, and edema.  He reports a recent episode of cramping in the right upper quadrant/lower chest that resolved spontaneously.  He inquires about switching from sildenafil to daily tadalafil for management of his erectile dysfunction, as even 100 mg of sildenafil does not always work.  He has never needed to use sublingual nitroglycerin.  He notes that BMP ordered before chest CT for evaluation of incidentally noted lung opacity on stress test was denied by his insurance.  --------------------------------------------------------------------------------------------------  Cardiovascular History & Procedures: Cardiovascular Problems: CAD s/p PCI & PTCA   Risk Factors: Known CAD,  hypertension, hyperlipidemia, diabetes mellitus, male gender, and age > 87   Cath/PCI: PCI (05/05/01): PCI to mid RCA with NIR 3.0 x 13 mm BMS (post-dilated with 3.25 mm balloon). POBA to mid LCx with 2.25 mm cutting balloon. PCI (05/03/01): PCI to proximal LAD CTO with Pixel 2.5 x 23 mm BMS (post-dilated with 3.0 mm balloon); POBA to mid LAD stenosis with 2.25 mm balloon LHC (04/30/01): LMCA normal. LAD with CTO of proximal vessel.  LCx with large OM1 followed by 90% stenosis in small AV groove LCx.  Dominant RCA with 90% midvessel stenosis.  Preserved LVEF with mid/apical anterior akinesis.   CV Surgery: None   EP Procedures and Devices: None    Non-Invasive Evaluation(s): TTE (09/29/2022): Normal LV size and wall thickness.  LVEF 60-65% with normal wall motion and diastolic function.  Mildly dilated RV with normal function.  Mild left atrial enlargement.  No pericardial effusion.  No significant valvular abnormality seen.  Normal CVP. Exercise MPI (09/17/2022): Low risk study.  Small in size, moderate in severity, partially reversible defect involving the apical anterior and apical segments most consistent with small area of scar and peri-infarct ischemia noted.  LVEF 55 to 65%.  Faint peripheral opacity in the right lower lobe noted; dedicated chest CT recommended. TTE (12/13/2019): Borderline LV enlargement and hypertrophy with LVEF 55-60%.  Normal diastolic function and wall motion.  Normal RV size and function.  Mild right atrial enlargement.  Mild mitral regurgitation. Exercise myocardial perfusion stress test (12/23/05): Fair exercise capacity without evidence of ischemia or scar. LVEF 53%.  Recent CV Pertinent Labs: Lab Results  Component Value Date   CHOL 97 10/15/2022   CHOL 113 08/16/2021   HDL 34 (L) 10/15/2022   HDL  31 (L) 08/16/2021   LDLCALC 52 10/15/2022   LDLCALC 59 08/16/2021   LDLDIRECT 70.8 06/30/2012   TRIG 53 10/15/2022   CHOLHDL 2.9 10/15/2022   K 4.0 10/15/2022   MG  1.9 10/20/2016   BUN 11 10/15/2022   BUN 11 08/16/2021   CREATININE 0.87 10/15/2022   CREATININE 0.92 02/15/2021    Past medical and surgical history were reviewed and updated in EPIC.  Current Meds  Medication Sig   Accu-Chek Softclix Lancets lancets E11.69 Use as instructed to check sugars twice daily   amLODipine (NORVASC) 5 MG tablet Take 1 tablet (5 mg total) by mouth daily.   aspirin EC 81 MG tablet Take 1 tablet (81 mg total) by mouth daily.   atorvastatin (LIPITOR) 20 MG tablet Take 1 tablet (20 mg total) by mouth daily.   glucose blood (ACCU-CHEK GUIDE) test strip E11.69 Use as instructed to check sugars twice daily   metFORMIN (GLUCOPHAGE-XR) 500 MG 24 hr tablet Take 2 tablets (1,000 mg total) by mouth 2 (two) times daily with a meal.   NEXIUM 40 MG capsule Take 1 capsule (40 mg total) by mouth daily at 12 noon.   nitroGLYCERIN (NITROSTAT) 0.4 MG SL tablet PLACE 1 TABLET UNDER THE TONGUE EVERY 5 MINUTES AS NEEDED FOR CHEST PAIN.   sildenafil (VIAGRA) 100 MG tablet Take 0.5 tablets (50 mg total) by mouth daily as needed for erectile dysfunction.    Allergies: Patient has no known allergies.  Social History   Tobacco Use   Smoking status: Former    Packs/day: 1.00    Years: 15.00    Additional pack years: 0.00    Total pack years: 15.00    Types: Cigarettes    Quit date: 09/15/2000    Years since quitting: 22.3   Smokeless tobacco: Never  Vaping Use   Vaping Use: Never used  Substance Use Topics   Alcohol use: No   Drug use: No    Family History  Problem Relation Age of Onset   Lung cancer Father 40       smoker   Hypertension Mother    Diabetes Neg Hx    Coronary artery disease Neg Hx    Stroke Neg Hx    Colon cancer Neg Hx    Esophageal cancer Neg Hx    Rectal cancer Neg Hx    Stomach cancer Neg Hx     Review of Systems: A 12-system review of systems was performed and was negative except as noted in the  HPI.  --------------------------------------------------------------------------------------------------  Physical Exam: BP 122/60 (BP Location: Left Arm, Patient Position: Sitting, Cuff Size: Normal)   Pulse 66   Ht 5\' 9"  (1.753 m)   Wt 221 lb (100.2 kg)   SpO2 97%   BMI 32.64 kg/m   General:  NAD. Neck: No JVD or HJR. Lungs: Clear to auscultation bilaterally without wheezes or crackles. Heart: Regular rate and rhythm with 1/6 systolic murmur. Abdomen: Soft, nontender, nondistended. Extremities: No lower extremity edema.   Lab Results  Component Value Date   WBC 9.4 10/15/2022   HGB 13.9 10/15/2022   HCT 40.8 10/15/2022   MCV 87.6 10/15/2022   PLT 191 10/15/2022    Lab Results  Component Value Date   NA 135 10/15/2022   K 4.0 10/15/2022   CL 102 10/15/2022   CO2 24 10/15/2022   BUN 11 10/15/2022   CREATININE 0.87 10/15/2022   GLUCOSE 183 (H) 10/15/2022   ALT 27 10/15/2022  Lab Results  Component Value Date   CHOL 97 10/15/2022   HDL 34 (L) 10/15/2022   LDLCALC 52 10/15/2022   LDLDIRECT 70.8 06/30/2012   TRIG 53 10/15/2022   CHOLHDL 2.9 10/15/2022    --------------------------------------------------------------------------------------------------  ASSESSMENT AND PLAN: Coronary artery disease with stable angina and hyperlipidemia associated with type 2 diabetes mellitus: Previously reported chest pain has resolved with addition of amlodipine, which we will continue given low-risk stress test earlier this year.  Continue aspirin and atorvastatin for secondary prevention; LDL very well controlled on last check in January (52).  Hypertension: BP very well-controlled with addition of amlodipine.  No medication changes today.  Erectile dysfunction: [name] reports only intermittent success with sildenafil and inquires about daily tadalafil use.  I encouraged him to discuss this further with Dr. Sharen Hones.  If he begins daily tadalafil, he should refrain  from using any nitroglycerin were he to develop chest pain in order to avoid precipitous hypotension.  We also discussed urology consultation should his erectile dysfunction persist; I will defer ongoing management to Dr. Sharen Hones.  Denied BMP: This was ordered in anticipation of IV contrast administration for CT chest to evaluate pulmonary opacity seen on attenuation correction CT portion of stress test.  I advised [name] to relay this to his insurance provider, as his hypertension and type 2 diabetes mellitus place him at risk for contrast induced nephropathy (particularly were he to have baseline renal dysfunction).  If he is unable to resolve this on his own, I asked that he contact us so that we can file an appeal on his behalf.  Follow-up: Return to clinic in 6 months.  Yvonne Kendall, MD 01/15/2023 10:00 AM

## 2023-01-15 NOTE — Patient Instructions (Signed)
Medication Instructions:  Your Physician recommend you continue on your current medication as directed.    *If you need a refill on your cardiac medications before your next appointment, please call your pharmacy*   Lab Work: None ordered today   Testing/Procedures: None ordered today  Please call insurance to make them aware of the following: Basic Metabolic Panel (BMP) ordered due to IV contrast, Contrast can cause a risk for induced nephropathy with history of Diabetes and Hypertension     Follow-Up: At [facility], you and your health needs are our priority.  As part of our continuing mission to provide you with exceptional heart care, we have created designated Provider Care Teams.  These Care Teams include your primary Cardiologist (physician) and Advanced Practice Providers (APPs -  Physician Assistants and Nurse Practitioners) who all work together to provide you with the care you need, when you need it.  We recommend signing up for the patient portal called "MyChart".  Sign up information is provided on this After Visit Summary.  MyChart is used to connect with patients for Virtual Visits (Telemedicine).  Patients are able to view lab/test results, encounter notes, upcoming appointments, etc.  Non-urgent messages can be sent to your provider as well.   To learn more about what you can do with MyChart, go to [email or web address].    Your next appointment:   6 month(s)  Provider:   You may see Yvonne Kendall, MD or one of the following Advanced Practice Providers on your designated Care Team:   Nicolasa Ducking, NP Eula Listen, PA-C Cadence Fransico Michael, PA-C Charlsie Quest, NP

## 2023-01-20 NOTE — Progress Notes (Signed)
HPI male former smoker followed for OSA, complicated by CAD/ 3vPCI, DM2, GERD, HBP, hyperlipidemia NPSG 11/30/07 - AHI 70/ hr weight 260 lbs Office Spirometry 08/11/17-mild restriction of exhaled volume-FVC 3.40/71%, FEV1 2.79/77%, ratio 0.82, FEF 25-75% 3.52/118% HST 01/30/17-AHI 72.3/hour, desaturation to 76%, body weight 230 pounds --------------------------------------------------------------------------------------------------- .   01/20/22- 67 year old male former smoker followed for OSA, complicated by CAD/ 3vPCI, DM2, GERD, HBP, hyperlipidemia CPAP auto 5-20/ [employer] Download- compliance 40%, AHI 0.1/ hr Body weight today 228 lbs Covid vax- None Flu vax-had Download reviewed and discussed with him.  He still tends to leave his CPAP off after getting up in the night for the bathroom.  Sometimes does not put it on or falls asleep first.  Discussed compliance goals and purpose with him.  Machine is old enough to replace.  He is noticing the humidifier does not seem to work right.  Admits he sleeps better when he wears CPAP.  01/22/23- 67 year old male former smoker followed for OSA, complicated by CAD/ 3vPCI, DM2, GERD, HBP, hyperlipidemia CPAP auto 5-20/ [employer] ?? Download- compliance  Body weight today 215 lbs He bought a used CPAP from a family member and reports using it about 20 days/ month. We discussed CPAP compliance goals, medical purpose and comfort issues.  He says nasal/sinus congestion interferes on some nights.  Otherwise he feels well.  He does not know his current CPAP settings although they were probably left at auto 5-20. CT chest 10/02/22- IMPRESSION: 1. Mild linear opacities of the peripheral right lower lobe seen at area of consolidation previously visualized on NM stress test, likely scarring related to prior infection or aspiration. 2. Hepatic steatosis. 3. Severe three-vessel coronary artery calcifications. 4. Aortic Atherosclerosis (ICD10-I70.0).   ROS-see HPI + =  positive Constitutional:   + weight loss, night sweats, fevers, chills, fatigue, lassitude. HEENT:    headaches, difficulty swallowing, tooth/dental problems, sore throat,       sneezing, itching, ear ache, + nasal congestion, post nasal drip, snoring CV:    chest pain, orthopnea, PND, swelling in lower extremities, anasarca,                                                          dizziness, palpitations Resp:   shortness of breath with exertion or at rest.                productive cough,   non-productive cough, coughing up of blood.              change in color of mucus.  wheezing.   Skin:    rash or lesions. GI:  No-   heartburn, indigestion, abdominal pain, nausea, vomiting, diarrhea,                 change in bowel habits, loss of appetite GU: dysuria, change in color of urine, no urgency or frequency.   flank pain. MS:   + Joint pain, stiffness, decreased range of motion, back pain. Neuro-     nothing unusual Psych:  change in mood or affect.  depression or anxiety.   memory loss.  OBJ- Physical Exam General- Alert, Oriented, Affect-appropriate, Distress- none acute Skin- rash-none, lesions- none, excoriation- none Lymphadenopathy- none Head- atraumatic, + small mandible/ beard  Eyes- Gross vision intact, PERRLA, conjunctivae and secretions clear            Ears- Hearing, canals-normal            Nose-+ turbinate edema +-Septal dev, mucus, polyps, erosion, perforation             Throat- Mallampati III , mucosa clear , drainage- none, tonsils- atrophic Neck- flexible , trachea midline, no stridor , thyroid nl, carotid no bruit Chest - symmetrical excursion , unlabored           Heart/CV- RRR , no murmur , no gallop  , no rub, nl s1 s2                           - JVD- none , edema- none, stasis changes- none, varices- none           Lung- clear to P&A, wheeze- none, cough- none , dullness-none, rub- none           Chest wall-  Abd-  Br/ Gen/ Rectal- Not done, not  indicated Extrem- cyanosis- none, clubbing, none, atrophy- none, strength- nl Neuro- grossly intact to observation

## 2023-01-22 ENCOUNTER — Encounter: Payer: Self-pay | Admitting: Internal Medicine

## 2023-01-22 ENCOUNTER — Ambulatory Visit: Payer: Medicare Other | Admitting: Internal Medicine

## 2023-01-22 VITALS — BP 140/70 | HR 66 | Ht 69.0 in | Wt 215.6 lb

## 2023-01-22 DIAGNOSIS — G4733 Obstructive sleep apnea (adult) (pediatric): Secondary | ICD-10-CM

## 2023-01-22 DIAGNOSIS — J3089 Other allergic rhinitis: Secondary | ICD-10-CM

## 2023-01-22 DIAGNOSIS — E669 Obesity, unspecified: Secondary | ICD-10-CM | POA: Diagnosis not present

## 2023-01-22 DIAGNOSIS — J302 Other seasonal allergic rhinitis: Secondary | ICD-10-CM | POA: Diagnosis not present

## 2023-01-22 NOTE — Patient Instructions (Addendum)
Order- DME [employer]- please replace old CPAP machine auto 5-20, mask of choice, humidifier, supplies, AirView/ card  For your sinuses- try an otc saline lavage to rinse your sinuses gently when needed. Warren Danes and store brand versions are fine. You can ask at the drug store.  Please call if we cn help

## 2023-01-26 ENCOUNTER — Ambulatory Visit: Payer: Medicare Other | Admitting: Family Medicine

## 2023-02-11 ENCOUNTER — Encounter: Payer: Self-pay | Admitting: Internal Medicine

## 2023-02-11 NOTE — Assessment & Plan Note (Signed)
We discussed OTC management options, especially nasal saline rinse

## 2023-02-11 NOTE — Assessment & Plan Note (Signed)
Encourage diet/ exercise 

## 2023-02-11 NOTE — Assessment & Plan Note (Signed)
He has benefited from CPAP. Plan-we are asking [facility] to replace old/borrowed machine, auto 5-20, restoring download capability

## 2023-03-30 ENCOUNTER — Ambulatory Visit: Payer: Medicare Other | Admitting: Family Medicine

## 2023-08-08 NOTE — Progress Notes (Deleted)
Cardiology Clinic Note   Date: 08/08/2023 ID: [name], DOB Jul 01, 1956, MRN [id number]  Primary Cardiologist:  Yvonne Kendall, MD  Patient Profile    [name] is a 67 y.o. male who presents to the clinic today for ***    Past medical history significant for: CAD. LHC 04/30/2001 (anteroseptal MI): CTO proximal LAD.  90% AV circumflex.  90% mid RCA.  CTS consult recommended. Staged PCI 05/05/2001: PCI with BMS 3.0 x 13 mm to mid RCA (residual narrowing 10%), Cutting Balloon angioplasty to distal circumflex (residual narrowing 20%). Nuclear stress test 09/17/2022: Abnormal, probably low risk pharmacologic myocardial perfusion stress test.  Small in size, moderate in severity, partially reversible defect involving the apical anterior and apical segments most consistent with scar and peri-infarct ischemia.  An element of artifact cannot be excluded.  LV SF normal, EF 55 to 65%.  Faint peripheral opacity in the right lower lobe was noted.  Finding is nonspecific, further evaluation with dedicated chest CT should be considered. Echo 09/29/2022: EF 60 to 65%.  No RWMA.  Normal diastolic parameters.  Normal RV size/function.  Mild LAE. Palpitations. Bradycardia. Ectatic abdominal aorta. Aorta duplex 11/06/2022: Ectatic proximal abdominal aorta with an outer diameter 2.8 cm.  Recommend follow-up in 5 years. Hypertension. Hyperlipidemia. Lipid panel 10/15/2022: LDL 52, HDL 34, TG 53, total 97. OSA. On CPAP. GERD. T2DM. Former tobacco abuse.  In summary, patient was previously followed by Dr. Dickie La and Dr. Shirlee Latch.  He has a history of CAD s/p BMS to RCA and Cutting Balloon angioplasty to LCx.  Nuclear stress test 12/23/2005 showed fair exercise capacity with normal BP response and no diagnostic ST changes to suggest ischemia.  Patient complained of worsened shortness of breath with and without activity during office visit December 2023.  Nuclear stress test and echo performed and detailed  above.  Amlodipine was added for antianginal therapy and BP control.  An incidental finding of right lower lobe opacity was noted on stress test.  Follow-up CT chest showed mild linear opacities of the peripheral right lower lobe seen at area of consolidation likely related to prior infection or aspiration.     History of Present Illness    [name] is followed by Dr. Okey Dupre for the above outlined history.   Patient was last seen in the office by Dr. Okey Dupre on 01/15/2023 for routine follow-up.  He was doing well at that time.  Discussed the use of AI scribe software for clinical note transcription with the patient, who gave verbal consent to proceed.         CAD S/p PCI with BMS to RCA and Cutting Balloon angioplasty to LCx August 2002.  Nuclear stress test was a low risk study with findings most consistent with scar or peri-infarct ischemia.  Patient*** -Continue amlodipine, aspirin, atorvastatin, as needed SL NTG.  Hypertension BP today*** -Continue amlodipine.  Ectasia of abdominal aorta Aortic duplex February 2024 showed ectatic proximal abdominal aorta 2.8 cm. -Plan for repeat ultrasound in 5 years.  Hyperlipidemia LDL January 2024 52, at goal. -Continue atorvastatin.   ROS: All other systems reviewed and are otherwise negative except as noted in History of Present Illness.  Studies Reviewed       ***  Risk Assessment/Calculations    {Does this patient have ATRIAL FIBRILLATION?:[phone]} No BP recorded.  {Refresh Note OR Click here to enter BP  :1}***        Physical Exam    VS:  There were  no vitals taken for this visit. , BMI There is no height or weight on file to calculate BMI.  GEN: Well nourished, well developed, in no acute distress. Neck: No JVD or carotid bruits. Cardiac: *** RRR. No murmurs. No rubs or gallops.   Respiratory:  Respirations regular and unlabored. Clear to auscultation without rales, wheezing or rhonchi. GI: Soft, nontender,  nondistended. Extremities: Radials/DP/PT 2+ and equal bilaterally. No clubbing or cyanosis. No edema ***  Skin: Warm and dry, no rash. Neuro: Strength intact.  Assessment & Plan                 Disposition: ***     {Are you ordering a CV Procedure (e.g. stress test, cath, DCCV, TEE, etc)?   Press F2        :[id number]}   Signed, Etta Grandchild. Annjeanette Sarwar, DNP, NP-C

## 2023-08-10 ENCOUNTER — Ambulatory Visit: Payer: Medicare Other | Admitting: Student

## 2023-09-23 ENCOUNTER — Other Ambulatory Visit: Payer: Self-pay | Admitting: Internal Medicine

## 2023-09-23 NOTE — Telephone Encounter (Signed)
Hi,  Please advise if ok to refill medication or defer to PCP . Thank you so much.

## 2023-09-25 NOTE — Telephone Encounter (Signed)
 Will provide 30 day refill.  If ongoing PPI therapy is needed, Mr. Sprigg will need to discuss this further with his PCP.  I will forward to Dr. Sharen Hones for his review as well.  Yvonne Kendall, MD [facility]

## 2023-09-29 NOTE — Telephone Encounter (Signed)
 Please schedule CPE with me for after 10/30/2023.

## 2023-09-29 NOTE — Telephone Encounter (Signed)
 Spoke to pt, scheduled cpe 11/24/23

## 2023-10-16 ENCOUNTER — Other Ambulatory Visit: Payer: Self-pay | Admitting: Family Medicine

## 2023-10-19 ENCOUNTER — Other Ambulatory Visit: Payer: Self-pay | Admitting: Internal Medicine

## 2023-10-30 ENCOUNTER — Ambulatory Visit: Payer: Medicare Other

## 2023-10-30 VITALS — Ht 69.0 in | Wt 210.0 lb

## 2023-10-30 DIAGNOSIS — Z0000 Encounter for general adult medical examination without abnormal findings: Secondary | ICD-10-CM

## 2023-10-30 NOTE — Patient Instructions (Signed)
[name] , Thank you for taking time to come for your Medicare Wellness Visit. I appreciate your ongoing commitment to your health goals. Please review the following plan we discussed and let me know if I can assist you in the future.   Referrals/Orders/Follow-Ups/Clinician Recommendations: none  This is a list of the screening recommended for you and due dates:  Health Maintenance  Topic Date Due   COVID-19 Vaccine (1) Never done   DTaP/Tdap/Td vaccine (2 - Tdap) 09/15/2020   Complete foot exam   11/14/2021   Yearly kidney health urinalysis for diabetes  09/13/2022   Hemoglobin A1C  04/28/2023   Eye exam for diabetics  09/21/2023   Yearly kidney function blood test for diabetes  10/16/2023   Medicare Annual Wellness Visit  10/29/2024   Colon Cancer Screening  11/19/2025   Pneumonia Vaccine  Completed   Flu Shot  Completed   Hepatitis C Screening  Completed   Zoster (Shingles) Vaccine  Completed   HPV Vaccine  Aged Out    Advanced directives: (Copy Requested) Please bring a copy of your health care power of attorney and living will to the office to be added to your chart at your convenience.  Next Medicare Annual Wellness Visit scheduled for next year: Yes 11/01/2024 ! 1:40pm

## 2023-10-30 NOTE — Progress Notes (Signed)
Subjective:   [name] is a 68 y.o. male who presents for Medicare Annual/Subsequent preventive examination.  Visit Complete: Virtual I connected with  [name] on 10/30/23 by a video and audio enabled telemedicine application and verified that I am speaking with the correct person using two identifiers.  Patient Location: Home  Provider Location: Office/Clinic  I discussed the limitations of evaluation and management by telemedicine. The patient expressed understanding and agreed to proceed.  Vital Signs: Because this visit was a virtual/telehealth visit, some criteria may be missing or patient reported. Any vitals not documented were not able to be obtained and vitals that have been documented are patient reported.  Patient Medicare AWV questionnaire was completed by the patient on 10/30/2023; I have confirmed that all information answered by patient is correct and no changes since this date.  Cardiac Risk Factors include: advanced age (>10men, >46 women);diabetes mellitus;dyslipidemia;male gender;hypertension;obesity (BMI >30kg/m2)     Objective:    Today's Vitals   10/30/23 1340  Weight: 210 lb (95.3 kg)  Height: 5\' 9"  (1.753 m)   Body mass index is 31.01 kg/m.     10/30/2023    1:48 PM 11/06/2015    5:07 PM  Advanced Directives  Does Patient Have a Medical Advance Directive? Yes No  Type of Estate agent of [place];Living will   Copy of Healthcare Power of Attorney in Chart? No - copy requested   Would patient like information on creating a medical advance directive?  No - patient declined information    Current Medications (verified) Outpatient Encounter Medications as of 10/30/2023  Medication Sig   Accu-Chek Softclix Lancets lancets E11.69 Use as instructed to check sugars twice daily   amLODipine (NORVASC) 5 MG tablet TAKE 1 TABLET (5 MG TOTAL) BY MOUTH DAILY.   aspirin EC 81 MG tablet Take 1 tablet (81 mg total) by mouth daily.    atorvastatin (LIPITOR) 20 MG tablet Take 1 tablet (20 mg total) by mouth daily.   esomeprazole (NEXIUM) 40 MG capsule TAKE 1 CAPSULE (40 MG TOTAL) BY MOUTH DAILY AT 12 NOON.   glucose blood (ACCU-CHEK GUIDE) test strip E11.69 Use as instructed to check sugars twice daily   metFORMIN (GLUCOPHAGE-XR) 500 MG 24 hr tablet Take 2 tablets (1,000 mg total) by mouth 2 (two) times daily with a meal.   nitroGLYCERIN (NITROSTAT) 0.4 MG SL tablet PLACE 1 TABLET UNDER THE TONGUE EVERY 5 MINUTES AS NEEDED FOR CHEST PAIN.   sildenafil (VIAGRA) 100 MG tablet Take 0.5 tablets (50 mg total) by mouth daily as needed for erectile dysfunction.   No facility-administered encounter medications on file as of 10/30/2023.    Allergies (verified) Patient has no known allergies.   History: Past Medical History:  Diagnosis Date   Cataract    Coronary atherosclerosis of unspecified type of vessel, native or graft 09/15/2000   s/p 3 vessel PCI   COVID-19 virus infection 02/15/2021   Diabetes type 2, controlled (HCC)    did not return calls to schedule DSME   Esophageal reflux    Essential hypertension 10/20/2016   Ex-smoker    quit 2002   HLD (hyperlipidemia)    OSA (obstructive sleep apnea)     CPAP ([place])   Sleep apnea    Umbilical hernia    Past Surgical History:  Procedure Laterality Date   ANAL FISSURE REPAIR  2008   COLONOSCOPY  06/2012   15 polyps - adenomatous, mild diverticulosis rec rpt 1  yr ([facility])   CORONARY STENT PLACEMENT  04/2001   x2 (Dr Maurice March)   FINGER FRACTURE SURGERY Right 2004   middle finger   SIGMOIDOSCOPY  1996   SINUS SURGERY WITH INSTATRAK  2001   Family History  Problem Relation Age of Onset   Lung cancer Father 44       smoker   Hypertension Mother    Diabetes Neg Hx    Coronary artery disease Neg Hx    Stroke Neg Hx    Colon cancer Neg Hx    Esophageal cancer Neg Hx    Rectal cancer Neg Hx    Stomach cancer Neg Hx    Social History   Socioeconomic History    Marital status: Married    Spouse name: Not on file   Number of children: Not on file   Years of education: Not on file   Highest education level: Associate degree: occupational, Scientist, product/process development, or vocational program  Occupational History   Occupation: Data processing manager    Employer: [employer]  Tobacco Use   Smoking status: Former    Current packs/day: 0.00    Average packs/day: 1 pack/day for 15.0 years (15.0 ttl pk-yrs)    Types: Cigarettes    Start date: 09/15/1985    Quit date: 09/15/2000    Years since quitting: 23.1   Smokeless tobacco: Never  Vaping Use   Vaping status: Never Used  Substance and Sexual Activity   Alcohol use: No   Drug use: No   Sexual activity: Not on file  Other Topics Concern   Not on file  Social History Narrative   Caffeine: 1 pepsi/day   Lives with wife, 1 dog, 2 cats. Grown daughter.   Occupation: Curator   Edu: HS   Activity: work 12 hour days, gardens and fishes   Diet: good water daily, fruits/vegetables daily   Social Drivers of Corporate investment banker Strain: Patient Declined (10/30/2023)   Overall Financial Resource Strain (CARDIA)    Difficulty of Paying Living Expenses: Patient declined  Food Insecurity: Patient Declined (10/30/2023)   Hunger Vital Sign    Worried About Running Out of Food in the Last Year: Patient declined    Ran Out of Food in the Last Year: Patient declined  Transportation Needs: No Transportation Needs (10/30/2023)   PRAPARE - Administrator, Civil Service (Medical): No    Lack of Transportation (Non-Medical): No  Physical Activity: Unknown (10/30/2023)   Exercise Vital Sign    Days of Exercise per Week: Patient declined    Minutes of Exercise per Session: Not on file  Stress: Patient Declined (10/30/2023)   [employer] of Occupational Health - Occupational Stress Questionnaire    Feeling of Stress : Patient declined  Social Connections: Unknown (10/30/2023)   Social Connection and  Isolation Panel [NHANES]    Frequency of Communication with Friends and Family: Patient declined    Frequency of Social Gatherings with Friends and Family: Patient declined    Attends Religious Services: Patient declined    Database administrator or Organizations: Patient declined    Attends Engineer, structural: Not on file    Marital Status: Married    Tobacco Counseling Counseling given: Not Answered  Clinical Intake:  Pre-visit preparation completed: Yes  Pain : No/denies pain    BMI - recorded: 31.01 Nutritional Status: BMI > 30  Obese Nutritional Risks: None Diabetes: Yes CBG done?: Yes (BS 141 this am at home) Did  pt. bring in CBG monitor from home?: No  How often do you need to have someone help you when you read instructions, pamphlets, or other written materials from your doctor or pharmacy?: 1 - Never  Interpreter Needed?: No  Comments: lives with wife Information entered by :: B.Jeananne Bedwell,LPN   Activities of Daily Living    10/30/2023    1:30 PM  In your present state of health, do you have any difficulty performing the following activities:  Hearing? 0  Vision? 0  Difficulty concentrating or making decisions? 0  Walking or climbing stairs? 0  Dressing or bathing? 0  Doing errands, shopping? 0  Preparing Food and eating ? N  Using the Toilet? N  In the past six months, have you accidently leaked urine? N  Do you have problems with loss of bowel control? N  Managing your Medications? N  Managing your Finances? N  Housekeeping or managing your Housekeeping? N    Patient Care Team: Eustaquio Boyden, MD as PCP - General (Family Medicine) End, Cristal Deer, MD as PCP - Cardiology (Cardiology)  Indicate any recent Medical Services you may have received from other than [facility] providers in the past year (date may be approximate).     Assessment:   This is a routine wellness examination for [place].  Hearing/Vision screen Hearing Screening -  Comments:: Pt says his hearing is pretty good Vision Screening - Comments:: Pt says vision good w/glasses My [facility] Dr Macario Carls   Goals Addressed             This Visit's Progress    Patient Stated       I would like to lose down to 180lb: try Ozempic, healthy diet, increase to 4 bottles/day       Depression Screen    10/30/2023    1:45 PM 10/28/2022    1:59 PM 09/13/2021   12:35 PM 04/23/2020   10:38 AM 04/19/2019    2:21 PM 04/19/2019    2:20 PM  PHQ 2/9 Scores  PHQ - 2 Score 0 0 1 0 0 0  PHQ- 9 Score  4 2 2 1      Fall Risk    10/30/2023    1:30 PM 10/28/2022    1:59 PM  Fall Risk   Falls in the past year? 0 0  Risk for fall due to : No Fall Risks   Follow up Falls prevention discussed;Education provided     MEDICARE RISK AT HOME: Medicare Risk at Home Any stairs in or around the home?: (Patient-Rptd) Yes If so, are there any without handrails?: (Patient-Rptd) No Home free of loose throw rugs in walkways, pet beds, electrical cords, etc?: (Patient-Rptd) Yes Adequate lighting in your home to reduce risk of falls?: (Patient-Rptd) Yes Life alert?: (Patient-Rptd) No Use of a cane, walker or w/c?: (Patient-Rptd) No Grab bars in the bathroom?: (Patient-Rptd) No Shower chair or bench in shower?: (Patient-Rptd) Yes Elevated toilet seat or a handicapped toilet?: (Patient-Rptd) Yes  TIMED UP AND GO:  Was the test performed?  No    Cognitive Function:        10/30/2023    1:49 PM  6CIT Screen  What Year? 0 points  What month? 0 points  What time? 0 points  Count back from 20 0 points  Months in reverse 0 points  Repeat phrase 0 points  Total Score 0 points    Immunizations Immunization History  Administered Date(s) Administered   Fluad Quad(high Dose 65+) 07/26/2022, 09/22/2023  Influenza,inj,Quad PF,6+ Mos 09/23/2019, 09/12/2020, 07/31/2021   Influenza-Unspecified 08/13/2017   PNEUMOCOCCAL CONJUGATE-20 10/28/2022   Pneumococcal Polysaccharide-23 01/17/2016   Td  09/15/2010   Zoster Recombinant(Shingrix) 11/14/2020, 03/20/2021    TDAP status: Up to date  Flu Vaccine status: Up to date  Pneumococcal vaccine status: Up to date  Covid-19 vaccine status: Declined, Education has been provided regarding the importance of this vaccine but patient still declined. Advised may receive this vaccine at local pharmacy or Health Dept.or vaccine clinic. Aware to provide a copy of the vaccination record if obtained from local pharmacy or Health Dept. Verbalized acceptance and understanding.  Qualifies for Shingles Vaccine? Yes   Zostavax completed Yes   Shingrix Completed?: Yes  Screening Tests Health Maintenance  Topic Date Due   COVID-19 Vaccine (1) Never done   DTaP/Tdap/Td (2 - Tdap) 09/15/2020   FOOT EXAM  11/14/2021   Diabetic kidney evaluation - Urine ACR  09/13/2022   HEMOGLOBIN A1C  04/28/2023   OPHTHALMOLOGY EXAM  09/21/2023   Diabetic kidney evaluation - eGFR measurement  10/16/2023   Medicare Annual Wellness (AWV)  10/29/2024   Colonoscopy  11/19/2025   Pneumonia Vaccine 2+ Years old  Completed   INFLUENZA VACCINE  Completed   Hepatitis C Screening  Completed   Zoster Vaccines- Shingrix  Completed   HPV VACCINES  Aged Out    Health Maintenance  Health Maintenance Due  Topic Date Due   COVID-19 Vaccine (1) Never done   DTaP/Tdap/Td (2 - Tdap) 09/15/2020   FOOT EXAM  11/14/2021   Diabetic kidney evaluation - Urine ACR  09/13/2022   HEMOGLOBIN A1C  04/28/2023   OPHTHALMOLOGY EXAM  09/21/2023   Diabetic kidney evaluation - eGFR measurement  10/16/2023    Colorectal cancer screening: Type of screening: Colonoscopy. Completed 11/20/2022. Repeat every 3 years  Lung Cancer Screening: (Low Dose CT Chest recommended if Age 45-80 years, 20 pack-year currently smoking OR have quit w/in 15years.) does not qualify.   Lung Cancer Screening Referral: no  Additional Screening:  Hepatitis C Screening: does not qualify; Completed  11/09/2015  Vision Screening: Recommended annual ophthalmology exams for early detection of glaucoma and other disorders of the eye. Is the patient up to date with their annual eye exam?  Yes  Who is the provider or what is the name of the office in which the patient attends annual eye exams? Dr Macario Carls If pt is not established with a provider, would they like to be referred to a provider to establish care? No .   Dental Screening: Recommended annual dental exams for proper oral hygiene  Diabetic Foot Exam: Diabetic Foot Exam: Overdue, Pt has been advised about the importance in completing this exam. Pt is scheduled for diabetic foot exam on 11/24/23 w/PCP visit.  Community Resource Referral / Chronic Care Management: CRR required this visit?  No   CCM required this visit?  No     Plan:     I have personally reviewed and noted the following in the patient's chart:   Medical and social history Use of alcohol, tobacco or illicit drugs  Current medications and supplements including opioid prescriptions. Patient is not currently taking opioid prescriptions. Functional ability and status Nutritional status Physical activity Advanced directives List of other physicians Hospitalizations, surgeries, and ER visits in previous 12 months Vitals Screenings to include cognitive, depression, and falls Referrals and appointments  In addition, I have reviewed and discussed with patient certain preventive protocols, quality metrics, and best practice recommendations.  A written personalized care plan for preventive services as well as general preventive health recommendations were provided to patient.    Sue Lush, LPN   [id number]   After Visit Summary: (MyChart) Due to this being a telephonic visit, the after visit summary with patients personalized plan was offered to patient via MyChart   Nurse Notes: The patient states he is doing well. He does express wanting to try Ozempic for weight  loss and control BS. He will talk w/PCP at visit in March 2025. He has no concerns or questions at this time.

## 2023-11-16 ENCOUNTER — Other Ambulatory Visit: Payer: Self-pay | Admitting: Family Medicine

## 2023-11-16 DIAGNOSIS — Z125 Encounter for screening for malignant neoplasm of prostate: Secondary | ICD-10-CM

## 2023-11-16 DIAGNOSIS — E785 Hyperlipidemia, unspecified: Secondary | ICD-10-CM

## 2023-11-16 DIAGNOSIS — E118 Type 2 diabetes mellitus with unspecified complications: Secondary | ICD-10-CM

## 2023-11-17 ENCOUNTER — Other Ambulatory Visit (INDEPENDENT_AMBULATORY_CARE_PROVIDER_SITE_OTHER): Payer: Medicare Other

## 2023-11-17 DIAGNOSIS — E118 Type 2 diabetes mellitus with unspecified complications: Secondary | ICD-10-CM | POA: Diagnosis not present

## 2023-11-17 DIAGNOSIS — E1169 Type 2 diabetes mellitus with other specified complication: Secondary | ICD-10-CM | POA: Diagnosis not present

## 2023-11-17 DIAGNOSIS — Z125 Encounter for screening for malignant neoplasm of prostate: Secondary | ICD-10-CM | POA: Diagnosis not present

## 2023-11-17 DIAGNOSIS — E785 Hyperlipidemia, unspecified: Secondary | ICD-10-CM | POA: Diagnosis not present

## 2023-11-17 LAB — LIPID PANEL
Cholesterol: 105 mg/dL (ref 0–200)
HDL: 33.4 mg/dL — ABNORMAL LOW (ref >39.00)
LDL Cholesterol: 52 mg/dL (ref 0–99)
NonHDL: 71.29
Total CHOL/HDL Ratio: 3
Triglycerides: 98 mg/dL (ref 0.0–149.0)
VLDL: 19.6 mg/dL (ref 0.0–40.0)

## 2023-11-17 LAB — MICROALBUMIN / CREATININE URINE RATIO
Creatinine,U: 128.1 mg/dL
Microalb Creat Ratio: 8.1 mg/g (ref 0.0–30.0)
Microalb, Ur: 1 mg/dL (ref 0.0–1.9)

## 2023-11-17 LAB — COMPREHENSIVE METABOLIC PANEL
ALT: 24 U/L (ref 0–53)
AST: 20 U/L (ref 0–37)
Albumin: 4.1 g/dL (ref 3.5–5.2)
Alkaline Phosphatase: 60 U/L (ref 39–117)
BUN: 11 mg/dL (ref 6–23)
CO2: 26 meq/L (ref 19–32)
Calcium: 9 mg/dL (ref 8.4–10.5)
Chloride: 103 meq/L (ref 96–112)
Creatinine, Ser: 0.83 mg/dL (ref 0.40–1.50)
GFR: 90.93 mL/min (ref >60.00)
Glucose, Bld: 161 mg/dL — ABNORMAL HIGH (ref 70–99)
Potassium: 4.6 meq/L (ref 3.5–5.1)
Sodium: 137 meq/L (ref 135–145)
Total Bilirubin: 0.6 mg/dL (ref 0.2–1.2)
Total Protein: 6.8 g/dL (ref 6.0–8.3)

## 2023-11-17 LAB — HEMOGLOBIN A1C: Hgb A1c MFr Bld: 7.4 % — ABNORMAL HIGH (ref 4.6–6.5)

## 2023-11-17 LAB — VITAMIN B12: Vitamin B-12: 125 pg/mL — ABNORMAL LOW (ref 211–911)

## 2023-11-17 LAB — PSA, MEDICARE: PSA: 0.37 ng/mL (ref 0.10–4.00)

## 2023-11-24 ENCOUNTER — Encounter: Payer: Self-pay | Admitting: Family Medicine

## 2023-11-24 ENCOUNTER — Ambulatory Visit (INDEPENDENT_AMBULATORY_CARE_PROVIDER_SITE_OTHER): Payer: Medicare Other | Admitting: Family Medicine

## 2023-11-24 VITALS — BP 130/68 | HR 76 | Temp 97.8°F | Ht 68.0 in | Wt 214.0 lb

## 2023-11-24 DIAGNOSIS — I10 Essential (primary) hypertension: Secondary | ICD-10-CM | POA: Diagnosis not present

## 2023-11-24 DIAGNOSIS — G4733 Obstructive sleep apnea (adult) (pediatric): Secondary | ICD-10-CM | POA: Diagnosis not present

## 2023-11-24 DIAGNOSIS — I771 Stricture of artery: Secondary | ICD-10-CM | POA: Insufficient documentation

## 2023-11-24 DIAGNOSIS — E118 Type 2 diabetes mellitus with unspecified complications: Secondary | ICD-10-CM | POA: Diagnosis not present

## 2023-11-24 DIAGNOSIS — K219 Gastro-esophageal reflux disease without esophagitis: Secondary | ICD-10-CM

## 2023-11-24 DIAGNOSIS — N529 Male erectile dysfunction, unspecified: Secondary | ICD-10-CM | POA: Diagnosis not present

## 2023-11-24 DIAGNOSIS — E785 Hyperlipidemia, unspecified: Secondary | ICD-10-CM | POA: Diagnosis not present

## 2023-11-24 DIAGNOSIS — Z9989 Dependence on other enabling machines and devices: Secondary | ICD-10-CM

## 2023-11-24 DIAGNOSIS — E538 Deficiency of other specified B group vitamins: Secondary | ICD-10-CM | POA: Diagnosis not present

## 2023-11-24 DIAGNOSIS — Z7189 Other specified counseling: Secondary | ICD-10-CM

## 2023-11-24 DIAGNOSIS — I25118 Atherosclerotic heart disease of native coronary artery with other forms of angina pectoris: Secondary | ICD-10-CM

## 2023-11-24 DIAGNOSIS — Z0000 Encounter for general adult medical examination without abnormal findings: Secondary | ICD-10-CM | POA: Diagnosis not present

## 2023-11-24 DIAGNOSIS — I25119 Atherosclerotic heart disease of native coronary artery with unspecified angina pectoris: Secondary | ICD-10-CM

## 2023-11-24 DIAGNOSIS — Z6830 Body mass index (BMI) 30.0-30.9, adult: Secondary | ICD-10-CM

## 2023-11-24 DIAGNOSIS — R0989 Other specified symptoms and signs involving the circulatory and respiratory systems: Secondary | ICD-10-CM

## 2023-11-24 DIAGNOSIS — E66811 Obesity, class 1: Secondary | ICD-10-CM

## 2023-11-24 DIAGNOSIS — E1169 Type 2 diabetes mellitus with other specified complication: Secondary | ICD-10-CM

## 2023-11-24 DIAGNOSIS — D519 Vitamin B12 deficiency anemia, unspecified: Secondary | ICD-10-CM

## 2023-11-24 DIAGNOSIS — I77811 Abdominal aortic ectasia: Secondary | ICD-10-CM

## 2023-11-24 MED ORDER — METFORMIN HCL ER 500 MG PO TB24: ORAL_TABLET | ORAL | 4 refills | Status: AC

## 2023-11-24 MED ORDER — CYANOCOBALAMIN 1000 MCG/ML IJ SOLN
1000.0000 ug | Freq: Once | INTRAMUSCULAR | Status: AC
  Administered 2023-11-24: 1000 ug via INTRAMUSCULAR

## 2023-11-24 MED ORDER — TADALAFIL 5 MG PO TABS: 5.0000 mg | ORAL_TABLET | Freq: Every day | ORAL | 11 refills | Status: AC

## 2023-11-24 MED ORDER — ATORVASTATIN CALCIUM 20 MG PO TABS: 20.0000 mg | ORAL_TABLET | Freq: Every day | ORAL | 4 refills | Status: AC

## 2023-11-24 MED ORDER — OZEMPIC (0.25 OR 0.5 MG/DOSE) 2 MG/3ML ~~LOC~~ SOPN: 0.2500 mg | PEN_INJECTOR | SUBCUTANEOUS | 0 refills | Status: AC

## 2023-11-24 MED ORDER — OZEMPIC (0.25 OR 0.5 MG/DOSE) 2 MG/1.5ML ~~LOC~~ SOPN: 0.5000 mg | PEN_INJECTOR | SUBCUTANEOUS | 11 refills | Status: AC

## 2023-11-24 MED ORDER — AMLODIPINE BESYLATE 5 MG PO TABS: 5.0000 mg | ORAL_TABLET | Freq: Every day | ORAL | 4 refills | Status: AC

## 2023-11-24 NOTE — Assessment & Plan Note (Signed)
 Preventative protocols reviewed and updated unless pt declined. Discussed healthy diet and lifestyle.

## 2023-11-24 NOTE — Assessment & Plan Note (Signed)
 Viagra 100mg  has lost effect He is interested in trial daily cialis -5mg  dose sent to pharmacy to price out.

## 2023-11-24 NOTE — Progress Notes (Signed)
 Ph: [phone] Fax: [phone]   Patient ID: [name], male    DOB: [birth year], 68 y.o.   MRN: [id number]  This visit was conducted in person.  BP 130/68 (BP Location: Right Arm, Cuff Size: Large)   Pulse 76   Temp 97.8 F (36.6 C) (Oral)   Ht 5\' 8"  (1.727 m)   Wt 214 lb (97.1 kg)   SpO2 96%   BMI 32.54 kg/m   BP Readings from Last 3 Encounters:  11/24/23 130/68  01/22/23 (!) 140/70  01/15/23 122/60   CC: CPE Subjective:   HPI: [name] is a 68 y.o. male presenting on 11/24/2023 for Annual Exam (MCR prt 2 [AWV- 10/30/23].)   Saw health advisor last month for medicare wellness visit. Note reviewed.  Lost brother and mother this year.   No results found.  Flowsheet Row Clinical Support from 10/30/2023 in [facility] [place]  PHQ-2 Total Score 0          10/30/2023    1:30 PM 10/28/2022    1:59 PM  Fall Risk   Falls in the past year? 0 0  Risk for fall due to : No Fall Risks   Follow up Falls prevention discussed;Education provided     OSA on CPAP through Dr Maple Hudson, DME [employer].    CAD s/p BMS to LAD and RCA 2002 and angioplasty to LCx - sees Dr End.    HTN - managed with amlodipine 5mg  daily.   DM - managed with metformin XR 1000mg  BID - ongoing loose stools 4x/day, progressively more loose as day goes on. Jardiance was unaffordable. He would be interested in [facility]. Mother with thyroid cancer, not medullary though.   Interested in trial of daily tadalafil instead of PRN sildenafil 100mg  - lost effect.   Preventative: Colonoscopy 11/2022 - TAx8, anal stricture, mod diverticulosis, rpt 3 yrs (Pyrtle)  Prostate cancer screening - previously saw urology (K[facility]rough) - yearly PSA.  Lung cancer screening CT - not eligible Flu shot - yearly  COVID vaccine - declined Tetanus 2012  RSV - to consider  Pneumovax - 2017, prevnar-20 10/2022 Shingrix - 11/2020, 03/2021  Advanced directive - discussed. Has this at home. HCPOA would  be wife Nenita. Asked to bring [place] a copy.  Seat belt use discussed  Sunscreen use discussed, no changing moles on skin.  Ex-smoker quit 2002, prior ~ 50 PY hx  Alcohol - occasional  Dentist q6 mo  Eye exam - yearly Bowel - no constipation  Bladder - no incontinence   Caffeine: 1 pepsi/day Lives with wife, 1 dog, 2 cats.  Grown daughter. Occupation: Curator Edu: HS Activity: work 12 hour days, gardens and fishes, has started walking regularly Diet: good water daily, fruits/vegetables daily     Relevant past medical, surgical, family and social history reviewed and updated as indicated. Interim medical history since our last visit reviewed. Allergies and medications reviewed and updated. Outpatient Medications Prior to Visit  Medication Sig Dispense Refill   Accu-Chek Softclix Lancets lancets E11.69 Use as instructed to check sugars twice daily 100 each 12   aspirin EC 81 MG tablet Take 1 tablet (81 mg total) by mouth daily. 90 tablet 3   esomeprazole (NEXIUM) 40 MG capsule TAKE 1 CAPSULE (40 MG TOTAL) BY MOUTH DAILY AT 12 NOON. 90 capsule 3   glucose blood (ACCU-CHEK GUIDE) test strip E11.69 Use as instructed to check sugars twice daily 100 each 12   nitroGLYCERIN (  NITROSTAT) 0.4 MG SL tablet PLACE 1 TABLET UNDER THE TONGUE EVERY 5 MINUTES AS NEEDED FOR CHEST PAIN. 25 tablet 3   sildenafil (VIAGRA) 100 MG tablet Take 0.5 tablets (50 mg total) by mouth daily as needed for erectile dysfunction. 30 tablet 3   amLODipine (NORVASC) 5 MG tablet TAKE 1 TABLET (5 MG TOTAL) BY MOUTH DAILY. 90 tablet 0   atorvastatin (LIPITOR) 20 MG tablet Take 1 tablet (20 mg total) by mouth daily. 90 tablet 4   metFORMIN (GLUCOPHAGE-XR) 500 MG 24 hr tablet Take 2 tablets (1,000 mg total) by mouth 2 (two) times daily with a meal. 360 tablet 4   No facility-administered medications prior to visit.     Per HPI unless specifically indicated in ROS section below Review of Systems  Constitutional:  Negative  for activity change, appetite change, chills, fatigue, fever and unexpected weight change.  HENT:  Negative for hearing loss.   Eyes:  Negative for visual disturbance.  Respiratory:  Negative for cough, chest tightness, shortness of breath and wheezing.   Cardiovascular:  Negative for chest pain, palpitations and leg swelling.  Gastrointestinal:  Positive for diarrhea (chronic loose stools). Negative for abdominal distention, abdominal pain, blood in stool, constipation, nausea and vomiting.  Genitourinary:  Negative for difficulty urinating and hematuria.  Musculoskeletal:  Negative for arthralgias, myalgias and neck pain.  Skin:  Negative for rash.  Neurological:  Positive for headaches. Negative for dizziness, seizures and syncope.  Hematological:  Negative for adenopathy. Bruises/bleeds easily.  Psychiatric/Behavioral:  Negative for dysphoric mood. The patient is not nervous/anxious.     Objective:  BP 130/68 (BP Location: Right Arm, Cuff Size: Large)   Pulse 76   Temp 97.8 F (36.6 C) (Oral)   Ht 5\' 8"  (1.727 m)   Wt 214 lb (97.1 kg)   SpO2 96%   BMI 32.54 kg/m   Wt Readings from Last 3 Encounters:  11/24/23 214 lb (97.1 kg)  10/30/23 210 lb (95.3 kg)  01/22/23 215 lb 9.6 oz (97.8 kg)      Physical Exam Vitals and nursing note reviewed.  Constitutional:      General: He is not in acute distress.    Appearance: Normal appearance. He is well-developed. He is not ill-appearing.  HENT:     Head: Normocephalic and atraumatic.     Right Ear: Hearing, tympanic membrane, ear canal and external ear normal.     Left Ear: Hearing, tympanic membrane, ear canal and external ear normal.     Mouth/Throat:     Mouth: Mucous membranes are moist.     Pharynx: Oropharynx is clear. No oropharyngeal exudate or posterior oropharyngeal erythema.  Eyes:     General: No scleral icterus.    Extraocular Movements: Extraocular movements intact.     Conjunctiva/sclera: Conjunctivae normal.      Pupils: Pupils are equal, round, and reactive to light.  Neck:     Thyroid: No thyroid mass or thyromegaly.     Vascular: Carotid bruit (left side) present.  Cardiovascular:     Rate and Rhythm: Normal rate and regular rhythm.     Pulses: Normal pulses.          Radial pulses are 2+ on the right side and 2+ on the left side.     Heart sounds: Normal heart sounds. No murmur heard. Pulmonary:     Effort: Pulmonary effort is normal. No respiratory distress.     Breath sounds: Normal breath sounds. No wheezing, rhonchi or  rales.     Comments: Slight LLL crackles Abdominal:     General: Bowel sounds are normal. There is no distension.     Palpations: Abdomen is soft. There is no mass.     Tenderness: There is no abdominal tenderness. There is no guarding or rebound.     Hernia: No hernia is present.  Musculoskeletal:        General: Normal range of motion.     Cervical back: Normal range of motion and neck supple.     Right lower leg: No edema.     Left lower leg: No edema.  Lymphadenopathy:     Cervical: No cervical adenopathy.  Skin:    General: Skin is warm and dry.     Findings: No rash.  Neurological:     General: No focal deficit present.     Mental Status: He is alert and oriented to person, place, and time.  Psychiatric:        Mood and Affect: Mood normal.        Behavior: Behavior normal.        Thought Content: Thought content normal.        Judgment: Judgment normal.       Results for orders placed or performed in visit on 11/17/23  Vitamin B12   Collection Time: 11/17/23  8:35 AM  Result Value Ref Range   Vitamin B-12 125 (L) 211 - 911 pg/mL  PSA, Medicare   Collection Time: 11/17/23  8:35 AM  Result Value Ref Range   PSA 0.37 0.10 - 4.00 ng/ml  Comprehensive metabolic panel   Collection Time: 11/17/23  8:35 AM  Result Value Ref Range   Sodium 137 135 - 145 mEq/L   Potassium 4.6 3.5 - 5.1 mEq/L   Chloride 103 96 - 112 mEq/L   CO2 26 19 - 32 mEq/L    Glucose, Bld 161 (H) 70 - 99 mg/dL   BUN 11 6 - 23 mg/dL   Creatinine, Ser 1.61 0.40 - 1.50 mg/dL   Total Bilirubin 0.6 0.2 - 1.2 mg/dL   Alkaline Phosphatase 60 39 - 117 U/L   AST 20 0 - 37 U/L   ALT 24 0 - 53 U/L   Total Protein 6.8 6.0 - 8.3 g/dL   Albumin 4.1 3.5 - 5.2 g/dL   GFR 09.60 >45.40 mL/min   Calcium 9.0 8.4 - 10.5 mg/dL  Lipid panel   Collection Time: 11/17/23  8:35 AM  Result Value Ref Range   Cholesterol 105 0 - 200 mg/dL   Triglycerides 98.1 0.0 - 149.0 mg/dL   HDL 19.14 (L) >78.29 mg/dL   VLDL 56.2 0.0 - 13.0 mg/dL   LDL Cholesterol 52 0 - 99 mg/dL   Total CHOL/HDL Ratio 3    NonHDL 71.29   Microalbumin / creatinine urine ratio   Collection Time: 11/17/23  8:35 AM  Result Value Ref Range   Microalb, Ur 1.0 0.0 - 1.9 mg/dL   Creatinine,U 865.7 mg/dL   Microalb Creat Ratio 8.1 0.0 - 30.0 mg/g  Hemoglobin A1c   Collection Time: 11/17/23  8:35 AM  Result Value Ref Range   Hgb A1c MFr Bld 7.4 (H) 4.6 - 6.5 %    Assessment & Plan:   Problem List Items Addressed This Visit     Healthcare maintenance - Primary (Chronic)   Preventative protocols reviewed and updated unless pt declined. Discussed healthy diet and lifestyle.       Advanced directives, counseling/discussion (  Chronic)   Advanced directive - discussed. Has this at home. HCPOA would be wife Nenita. Asked to bring [place] a copy.       Hyperlipidemia LDL goal < 70 associated with type 2 diabetes mellitus (HCC)   Chronic, stable on atorvastatin - continue. The ASCVD Risk score (Arnett DK, et al., 2019) failed to calculate for the following reasons:   The valid total cholesterol range is 130 to 320 mg/dL       Relevant Medications   amLODipine (NORVASC) 5 MG tablet   atorvastatin (LIPITOR) 20 MG tablet   metFORMIN (GLUCOPHAGE-XR) 500 MG 24 hr tablet   tadalafil (CIALIS) 5 MG tablet   Semaglutide,0.25 or 0.5MG /DOS, (OZEMPIC, 0.25 OR 0.5 MG/DOSE,) 2 MG/3ML SOPN   Semaglutide,0.25 or 0.5MG /DOS,  (OZEMPIC, 0.25 OR 0.5 MG/DOSE,) 2 MG/1.5ML SOPN (Start on 12/22/2023)   OSA on CPAP   OSA on CPAP followed by pulm      Gastroesophageal reflux disease without esophagitis   Stable period on esomeprazole 40mg  daily.       Diabetes mellitus type 2 with complications (HCC)   Chronic, A1c improved but still above goal. Metformin XR 2000mg  daily causing GI intolerance (worsening loose stools). Drop dose to 1500mg  daily and start ozempic as per below.  Patient is interested in [place]. Reviewed mechanism of action of medication as well as side effects and adverse events to watch for including nausea, diarrhea, constipation, pancreatitis. No fmhx medullary thyroid cancer or MEN2. Discussed titration schedule for medication. Will start ozempic 0.25mg  weekly x1 mo then increase to 0.5mg  weekly -Rx sent to pharmacy.  RTC 3 mo DM f/u visit       Relevant Medications   atorvastatin (LIPITOR) 20 MG tablet   metFORMIN (GLUCOPHAGE-XR) 500 MG 24 hr tablet   Semaglutide,0.25 or 0.5MG /DOS, (OZEMPIC, 0.25 OR 0.5 MG/DOSE,) 2 MG/3ML SOPN   Semaglutide,0.25 or 0.5MG /DOS, (OZEMPIC, 0.25 OR 0.5 MG/DOSE,) 2 MG/1.5ML SOPN (Start on 12/22/2023)   Obesity, Class I, BMI 30.0-34.9 (see actual BMI)   Encouraged healthy diet and lifestyle choices to affect sustainable weight loss. Start ozempic as per below.       Erectile dysfunction   Viagra 100mg  has lost effect He is interested in trial daily cialis -5mg  dose sent to pharmacy to price out.        Essential hypertension   Chronic, stable. Continue current regien. Consider ACEI/ARB in diabetic.  Umicroalb/cr ratio stable.       Relevant Medications   amLODipine (NORVASC) 5 MG tablet   atorvastatin (LIPITOR) 20 MG tablet   tadalafil (CIALIS) 5 MG tablet   Coronary artery disease of native artery of native heart with stable angina pectoris (HCC)   Appreciate cardiology care (End)      Relevant Medications   amLODipine (NORVASC) 5 MG tablet   atorvastatin  (LIPITOR) 20 MG tablet   tadalafil (CIALIS) 5 MG tablet   Ectatic abdominal aorta (HCC)   Relevant Medications   amLODipine (NORVASC) 5 MG tablet   atorvastatin (LIPITOR) 20 MG tablet   tadalafil (CIALIS) 5 MG tablet   Vitamin B12 deficiency   Newly noted - anticipate metformin contributes to this. B12 shot IM today then start daily OTC.  Reassess at next labwork.       Left carotid bruit   Check carotid US.       Relevant Orders   VAS US CAROTID     Meds ordered this encounter  Medications   amLODipine (NORVASC) 5 MG  tablet    Sig: Take 1 tablet (5 mg total) by mouth daily.    Dispense:  90 tablet    Refill:  4   atorvastatin (LIPITOR) 20 MG tablet    Sig: Take 1 tablet (20 mg total) by mouth daily.    Dispense:  90 tablet    Refill:  4   metFORMIN (GLUCOPHAGE-XR) 500 MG 24 hr tablet    Sig: Take 2 tablets (1,000 mg total) by mouth daily with breakfast AND 1 tablet (500 mg total) at bedtime.    Dispense:  270 tablet    Refill:  4   tadalafil (CIALIS) 5 MG tablet    Sig: Take 1 tablet (5 mg total) by mouth daily.    Dispense:  30 tablet    Refill:  11    In place of viagra   Semaglutide,0.25 or 0.5MG /DOS, (OZEMPIC, 0.25 OR 0.5 MG/DOSE,) 2 MG/3ML SOPN    Sig: Inject 0.25 mg into the skin once a week.    Dispense:  3 mL    Refill:  0   Semaglutide,0.25 or 0.5MG /DOS, (OZEMPIC, 0.25 OR 0.5 MG/DOSE,) 2 MG/1.5ML SOPN    Sig: Inject 0.5 mg into the skin once a week.    Dispense:  1.5 mL    Refill:  11   cyanocobalamin (VITAMIN B12) injection 1,000 mcg    No orders of the defined types were placed in this encounter.   Patient Instructions  Drop metformin XR to 3 a day (2 in am and 1 in pm).  Start ozempic as discussed.  B12 shot today then start vitamin b12 daily.  Ok to try cialis 5mg  daily in place of viagra - update [place] with effect. No nitroglycerine use.  Bring [place] copy of your living will to update chart  Return in 3-4 months for diabetes  follow up visit   Follow up plan: Return in about 3 months (around 02/24/2024) for follow up visit.  Eustaquio Boyden, MD

## 2023-11-24 NOTE — Assessment & Plan Note (Signed)
 Chronic, A1c improved but still above goal. Metformin XR 2000mg  daily causing GI intolerance (worsening loose stools). Drop dose to 1500mg  daily and start ozempic as per below.  Patient is interested in [place]. Reviewed mechanism of action of medication as well as side effects and adverse events to watch for including nausea, diarrhea, constipation, pancreatitis. No fmhx medullary thyroid cancer or MEN2. Discussed titration schedule for medication. Will start ozempic 0.25mg  weekly x1 mo then increase to 0.5mg  weekly -Rx sent to pharmacy.  RTC 3 mo DM f/u visit

## 2023-11-24 NOTE — Patient Instructions (Addendum)
 Drop metformin XR to 3 a day (2 in am and 1 in pm).  Start ozempic as discussed.  B12 shot today then start vitamin b12 daily.  Ok to try cialis 5mg  daily in place of viagra - update [place] with effect. No nitroglycerine use.  Bring [place] copy of your living will to update chart  Return in 3-4 months for diabetes follow up visit

## 2023-11-24 NOTE — Assessment & Plan Note (Signed)
Encouraged healthy diet and lifestyle choices to affect sustainable weight loss.  Start ozempic as per below.

## 2023-11-24 NOTE — Assessment & Plan Note (Signed)
Advanced directive - discussed. Has this at home. HCPOA would be wife Nenita. Asked to bring us a copy.  

## 2023-11-24 NOTE — Assessment & Plan Note (Signed)
 Appreciate cardiology care (End)

## 2023-11-24 NOTE — Assessment & Plan Note (Signed)
Check carotid US. 

## 2023-11-24 NOTE — Assessment & Plan Note (Signed)
 Stable period on esomeprazole 40mg  daily.

## 2023-11-24 NOTE — Assessment & Plan Note (Signed)
 OSA on CPAP followed by pulm

## 2023-11-24 NOTE — Assessment & Plan Note (Addendum)
 Chronic, stable. Continue current regien. Consider ACEI/ARB in diabetic.  Umicroalb/cr ratio stable.

## 2023-11-24 NOTE — Assessment & Plan Note (Signed)
 Newly noted - anticipate metformin contributes to this. B12 shot IM today then start daily OTC.  Reassess at next labwork.

## 2023-11-24 NOTE — Assessment & Plan Note (Signed)
Chronic, stable on atorvastatin - continue. ?The ASCVD Risk score (Arnett DK, et al., 2019) failed to calculate for the following reasons: ?  The valid total cholesterol range is 130 to 320 mg/dL  ?

## 2023-11-26 ENCOUNTER — Telehealth: Payer: Self-pay

## 2023-11-26 ENCOUNTER — Other Ambulatory Visit (HOSPITAL_COMMUNITY): Payer: Self-pay

## 2023-11-26 NOTE — Telephone Encounter (Signed)
 Pharmacy Patient Advocate Encounter   Received notification from Onbase that prior authorization for Tadalafil 5MG  tablets is required/requested.   Insurance verification completed.   The patient is insured through [facility] .   Per test claim: PA required; PA submitted to above mentioned insurance via CoverMyMeds Key/confirmation #/EOC [id number] Status is pending

## 2023-12-03 ENCOUNTER — Ambulatory Visit: Attending: Family Medicine

## 2023-12-03 DIAGNOSIS — R0989 Other specified symptoms and signs involving the circulatory and respiratory systems: Secondary | ICD-10-CM | POA: Diagnosis present

## 2023-12-03 NOTE — Telephone Encounter (Signed)
 Pharmacy Patient Advocate Encounter  Received notification from [facility] that Prior Authorization for Tadalafil has been DENIED.  Full denial letter will be uploaded to the media tab. See denial reason below.   PA #/Case ID/Reference #: Not given, please see full denial letter attached in pt's media tab.

## 2023-12-09 ENCOUNTER — Encounter: Payer: Self-pay | Admitting: Family Medicine

## 2024-01-21 NOTE — Progress Notes (Signed)
 HPI male former smoker followed for OSA, complicated by CAD/ 3vPCI, DM2, GERD, HBP, hyperlipidemia NPSG 11/30/07 - AHI 70/ hr weight 260 lbs Office Spirometry 08/11/17-mild restriction of exhaled volume-FVC 3.40/71%, FEV1 2.79/77%, ratio 0.82, FEF 25-75% 3.52/118% HST 01/30/17-AHI 72.3/hour, desaturation to 76%, body weight 230 pounds    ======================================================================================  01/22/23- 68 year old male former smoker followed for OSA, complicated by CAD/ 3vPCI, DM2, GERD, HBP, hyperlipidemia CPAP auto 5-20/ [employer] ?? Download- compliance  Body weight today 215 lbs He bought a used CPAP from a family member and reports using it about 20 days/ month. We discussed CPAP compliance goals, medical purpose and comfort issues.  He says nasal/sinus congestion interferes on some nights.  Otherwise he feels well.  He does not know his current CPAP settings although they were probably left at auto 5-20. CT chest 10/02/22- IMPRESSION: 1. Mild linear opacities of the peripheral right lower lobe seen at area of consolidation previously visualized on NM stress test, likely scarring related to prior infection or aspiration. 2. Hepatic steatosis. 3. Severe three-vessel coronary artery calcifications. 4. Aortic Atherosclerosis (ICD10-I70.0).  01/21/24- 68 year old male former smoker followed for OSA, complicated by CAD/ 3vPCI, DM2, GERD, HBP, hyperlipidemia CPAP auto 5-20/  Download- compliance  Body weight today-219 lbs He refused to give DME a credit card number, so no DME is involved. He gets supplies on line. He is alternating between his old CPAP and a new machine from a family member who died. He says this is working fine.  Discussed the use of AI scribe software for clinical note transcription with the patient, who gave verbal consent to proceed.  History of Present Illness   [name] is a 68 year old male with coronary artery disease who presents  for follow-up regarding CPAP use.  He uses a ResMed Autosense 10 CPAP machine 15 to 20 nights a month, adjusting usage based on comfort and sleep needs. Some nights he sleeps comfortably for eight hours, while other nights he removes it after a couple of hours due to discomfort. He reports no recent breathing difficulties and feels he is doing well overall. We discussed compliance and medical goals, especially in the context of his coronary artery disease. He feels he is doing well.     We will help him get replacement on-line for old CPAP.  Assessment and Plan:    Obstructive sleep apnea He is not about to share financial data with a DME company, Owns his machine and gets supplies on line. -Plan- emphasized medical basis for compliance goals. Continue auto 5-20   Coronary artery disease Coronary artery disease managed by cardiology, no recent angina or dyspnea, favorable evaluation results. - Continue CPAP use as tolerated. - Follow up with primary care physician next month.     ROS-see HPI + = positive Constitutional:   + weight loss, night sweats, fevers, chills, fatigue, lassitude. HEENT:    headaches, difficulty swallowing, tooth/dental problems, sore throat,       sneezing, itching, ear ache, + nasal congestion, post nasal drip, snoring CV:    chest pain, orthopnea, PND, swelling in lower extremities, anasarca,                                                           dizziness, palpitations Resp:   shortness  of breath with exertion or at rest.                productive cough,   non-productive cough, coughing up of blood.              change in color of mucus.  wheezing.   Skin:    rash or lesions. GI:  No-   heartburn, indigestion, abdominal pain, nausea, vomiting, diarrhea,                 change in bowel habits, loss of appetite GU: dysuria, change in color of urine, no urgency or frequency.   flank pain. MS:   + Joint pain, stiffness, decreased range of motion, back  pain. Neuro-     nothing unusual Psych:  change in mood or affect.  depression or anxiety.   memory loss.  OBJ- Physical Exam General- Alert, Oriented, Affect-appropriate, Distress- none acute Skin- rash-none, lesions- none, excoriation- none Lymphadenopathy- none Head- atraumatic, + small mandible/ beard            Eyes- Gross vision intact, PERRLA, conjunctivae and secretions clear            Ears- Hearing, canals-normal            Nose-+ turbinate edema +-Septal dev, mucus, polyps, erosion, perforation             Throat- Mallampati III , mucosa clear , drainage- none, tonsils- atrophic Neck- flexible , trachea midline, no stridor , thyroid nl, carotid no bruit Chest - symmetrical excursion , unlabored           Heart/CV- RRR , + murmur+trace S , no gallop  , no rub, nl s1 s2                           - JVD- none , edema- none, stasis changes- none, varices- none           Lung- clear to P&A, wheeze- none, cough- none , dullness-none, rub- none           Chest wall-  Abd-  Br/ Gen/ Rectal- Not done, not indicated Extrem- cyanosis- none, clubbing, none, atrophy- none, strength- nl Neuro- grossly intact to observation

## 2024-01-22 ENCOUNTER — Ambulatory Visit (INDEPENDENT_AMBULATORY_CARE_PROVIDER_SITE_OTHER): Payer: Medicare Other | Admitting: Internal Medicine

## 2024-01-22 ENCOUNTER — Encounter: Payer: Self-pay | Admitting: Internal Medicine

## 2024-01-22 VITALS — BP 134/71 | HR 59 | Temp 98.1°F | Ht 69.0 in | Wt 219.8 lb

## 2024-01-22 DIAGNOSIS — Z87891 Personal history of nicotine dependence: Secondary | ICD-10-CM

## 2024-01-22 DIAGNOSIS — I2510 Atherosclerotic heart disease of native coronary artery without angina pectoris: Secondary | ICD-10-CM | POA: Diagnosis not present

## 2024-01-22 DIAGNOSIS — G4733 Obstructive sleep apnea (adult) (pediatric): Secondary | ICD-10-CM | POA: Diagnosis not present

## 2024-01-22 NOTE — Patient Instructions (Signed)
 Ok to continue CPAP auto 5-20. The more you can use it, probably the better.  Please call if we can help

## 2024-02-18 ENCOUNTER — Ambulatory Visit: Admitting: Internal Medicine

## 2024-02-22 ENCOUNTER — Encounter: Payer: Self-pay | Admitting: Cardiology

## 2024-02-22 ENCOUNTER — Ambulatory Visit: Attending: Cardiology | Admitting: Cardiology

## 2024-02-22 VITALS — BP 130/70 | HR 62 | Ht 69.0 in | Wt 222.6 lb

## 2024-02-22 DIAGNOSIS — I10 Essential (primary) hypertension: Secondary | ICD-10-CM | POA: Diagnosis not present

## 2024-02-22 DIAGNOSIS — N529 Male erectile dysfunction, unspecified: Secondary | ICD-10-CM | POA: Diagnosis present

## 2024-02-22 DIAGNOSIS — I25118 Atherosclerotic heart disease of native coronary artery with other forms of angina pectoris: Secondary | ICD-10-CM | POA: Insufficient documentation

## 2024-02-22 DIAGNOSIS — E1169 Type 2 diabetes mellitus with other specified complication: Secondary | ICD-10-CM | POA: Diagnosis present

## 2024-02-22 DIAGNOSIS — E785 Hyperlipidemia, unspecified: Secondary | ICD-10-CM | POA: Insufficient documentation

## 2024-02-22 MED ORDER — NITROGLYCERIN 0.4 MG SL SUBL: 0.4000 mg | SUBLINGUAL_TABLET | SUBLINGUAL | 3 refills | Status: AC | PRN

## 2024-02-22 MED ORDER — ESOMEPRAZOLE MAGNESIUM 40 MG PO CPDR: 40.0000 mg | DELAYED_RELEASE_CAPSULE | Freq: Every day | ORAL | 3 refills | Status: AC

## 2024-02-22 NOTE — Patient Instructions (Signed)
 Medication Instructions:  Your physician recommends that you continue on your current medications as directed. Please refer to the Current Medication list given to you today.   *If you need a refill on your cardiac medications before your next appointment, please call your pharmacy*  Lab Work: No labs ordered today  If you have labs (blood work) drawn today and your tests are completely normal, you will receive your results only by: MyChart Message (if you have MyChart) OR A paper copy in the mail If you have any lab test that is abnormal or we need to change your treatment, we will call you to review the results.  Testing/Procedures: No test ordered today   Follow-Up: At [facility], you and your health needs are our priority.  As part of our continuing mission to provide you with exceptional heart care, our providers are all part of one team.  This team includes your primary Cardiologist (physician) and Advanced Practice Providers or APPs (Physician Assistants and Nurse Practitioners) who all work together to provide you with the care you need, when you need it.  Your next appointment:   6 month(s)  Provider:   Sammy Crisp, MD or Ronald Cockayne, NP

## 2024-02-22 NOTE — Progress Notes (Signed)
 Cardiology Office Note   Date:  02/22/2024  ID:  PHELAN SCHADT, DOB Feb 12, 1956, MRN [id number] PCP: Claire Crick, MD  [facility] Providers Cardiologist:  Sammy Crisp, MD     History of Present Illness [name] is a 68 y.o. male with past medical history of coronary artery disease status post bare-metal stents to the LAD and RCA (2002) as well as angioplasty to the left circumflex, hypertension, hyperlipidemia, type 2 diabetes, GERD, obstructive sleep apnea on CPAP, who presents today for follow-up of his coronary artery disease.   Previous left heart catheterization done in 04/30/2001 revealed LMCA normal, LAD with CTO proximal disease, left circumflex large OM1 followed by 90% stenosis to small AV groove left circumflex.  Dominant RCA with 90% mid vessel stenosis.  Preserved LV EF with mid/apical anterior akinesis.  On 05/03/2021 underwent PCI to the proximal LAD with BMS.  Additional PCI on 05/05/2001 to the mid RCA with BMS.  Echocardiogram done 11/2018 revealed an LVEF of 55 to 60%.  Normal diastolic function and wall motion, mild right atrial enlargement, mild mitral regurgitation.  Exercise MPI in 09/17/2022 with a low risk study, small in size, moderate in severity, partially reversible defect involving the apical, anterior, and apical segments most consistent with a small area of scar.  Repeat echocardiogram completed 09/29/2022 revealed an LVEF of 60 to 65% with normal wall motion and diastolic function.  No significant valvular abnormality was noted.   He was last seen in clinic 01/15/2023 by Dr. Nolan Battle.  At that time he was feeling quite well, denied any chest pain, shortness of breath, palpitations, lightheaded or peripheral edema.  He had a recent episode of cramping in the right upper quadrant and lower chest pain that resolved spontaneously.  He had inquired of switching from sildenafil  to tadalafil  for management of his CAD.  He has never used sublingual nitroglycerin .   There were no medication changes that were made and he was encouraged to follow-up with his PCP about his ED and even as far as following up with urology if needed.  He returns to clinic today stating that he has been doing well from a cardiac perspective.  He denies any chest pain, shortness of breath, palpitations or peripheral edema.  States that he has been compliant with his current medication regimen without any undesired except.  He is requesting several refills on medications today.  Denies any hospitalizations or visits to the emergency department.  ROS: 10 point review of systems has been reviewed and considered negative except ones were listed in the HPI  Studies Reviewed EKG Interpretation Date/Time:  Monday February 22 2024 14:28:22 EDT Ventricular Rate:  62 PR Interval:  100 QRS Duration:  80 QT Interval:  392 QTC Calculation: 397 R Axis:   23  Text Interpretation: Sinus rhythm with short PR Septal infarct , age undetermined When compared with ECG of 02-Nov-2019 09:41, No significant change was found Confirmed by Ronald Cockayne ([id number]) on 02/22/2024 2:30:48 PM    Cath/PCI: PCI (05/05/01): PCI to mid RCA with NIR 3.0 x 13 mm BMS (post-dilated with 3.25 mm balloon). POBA to mid LCx with 2.25 mm cutting balloon. PCI (05/03/01): PCI to proximal LAD CTO with Pixel 2.5 x 23 mm BMS (post-dilated with 3.0 mm balloon); POBA to mid LAD stenosis with 2.25 mm balloon LHC (04/30/01): LMCA normal. LAD with CTO of proximal vessel.  LCx with large OM1 followed by 90% stenosis in small AV groove LCx.  Dominant RCA with  90% midvessel stenosis.  Preserved LVEF with mid/apical anterior akinesis.  Non-Invasive Evaluation(s): TTE (09/29/2022): Normal LV size and wall thickness.  LVEF 60-65% with normal wall motion and diastolic function.  Mildly dilated RV with normal function.  Mild left atrial enlargement.  No pericardial effusion.  No significant valvular abnormality seen.  Normal CVP. Exercise MPI  (09/17/2022): Low risk study.  Small in size, moderate in severity, partially reversible defect involving the apical anterior and apical segments most consistent with small area of scar and peri-infarct ischemia noted.  LVEF 55 to 65%.  Faint peripheral opacity in the right lower lobe noted; dedicated chest CT recommended. TTE (12/13/2019): Borderline LV enlargement and hypertrophy with LVEF 55-60%.  Normal diastolic function and wall motion.  Normal RV size and function.  Mild right atrial enlargement.  Mild mitral regurgitation. Exercise myocardial perfusion stress test (12/23/05): Fair exercise capacity without evidence of ischemia or scar. LVEF 53%. Risk Assessment/Calculations           Physical Exam VS:  BP 130/70 (BP Location: Left Arm, Patient Position: Sitting, Cuff Size: Normal)   Pulse 62   Ht 5\' 9"  (1.753 m)   Wt 222 lb 9.6 oz (101 kg)   SpO2 97%   BMI 32.87 kg/m    Wt Readings from Last 3 Encounters:  02/22/24 222 lb 9.6 oz (101 kg)  01/22/24 219 lb 12.8 oz (99.7 kg)  11/24/23 214 lb (97.1 kg)    GEN: Well nourished, well developed in no acute distress NECK: No JVD; No carotid bruits CARDIAC: RRR, I/VI murmurs, rubs, gallops RESPIRATORY:  Clear to auscultation without rales, wheezing or rhonchi  ABDOMEN: Soft, non-tender, non-distended EXTREMITIES:  No edema; No deformity   ASSESSMENT AND PLAN Coronary artery disease of native coronary arteries with stable angina.  EKG today reveals sinus rhythm with rate of 62 with an old septal infarct with no acute changes.  He has been continued on aspirin  81 mg daily and atorvastatin  20 mg daily.  Previous Lexiscan  Myoview  completed in January 2024 showed an element of artifact that cannot be excluded and a small in size, moderate in severity, partially reversible defect involving the apical anterior and apical size consistent with scar and peri-infarct ischemia.  After further discussion with the patient it was decided that no further  ischemic testing was needed.  The rest of my effort was of right lung abnormality noted on stress testing and was scheduled for CT scan.  He was scheduled for an updated echocardiogram which revealed LVEF of 60 to 65%, no RWMA, right ventricular systolic function was normal and mildly enlarged in size.  No valvular abnormalities were noted.  At that time he was scheduled for a CT which showed some scarring in the area of abnormality in his right lung noted on recent stress testing.  No further ischemic testing is ordered today.  Hyperlipidemia with associated type 2 diabetes with an LDL of 52 that remains at goal of less than 55.  He has been continued on atorvastatin  20 mg daily with ongoing management of his diabetes per his PCP.  Primary hypertension with blood pressure today 130/70.  He has been continued on amlodipine  5 mg daily.  Blood pressure has been well-controlled on his current medication regimen.  He has been encouraged to continue to monitor his pressure 1 to 2 hours postmedication administration.  Erectile dysfunction where he is continued on Cialis  5 mg daily.  He has not required the use of any of his Nitrostat   sublingually.  This is managed by his PCP.       Dispo: Patient to return to clinic to see MD/APP in 6 months or sooner if needed.  Signed, Shamon Cothran, NP

## 2024-02-24 ENCOUNTER — Ambulatory Visit: Admitting: Family Medicine

## 2024-08-08 ENCOUNTER — Other Ambulatory Visit: Payer: Self-pay | Admitting: Family Medicine

## 2024-08-10 NOTE — Telephone Encounter (Signed)
Please phone in due to E prescribing error.  

## 2024-08-10 NOTE — Telephone Encounter (Signed)
Refill left on vm at pharmacy.  

## 2024-10-04 ENCOUNTER — Telehealth: Payer: Self-pay

## 2024-10-04 NOTE — Telephone Encounter (Signed)
 Copied from CRM #[id number]. Topic: Appointments - Transfer of Care >> Oct 04, 2024  3:25 PM Rozanna MATSU wrote: Pt is requesting to transfer FROM: young Pt is requesting to transfer TO: [facility] Reason for requested transfer: provider retired It is the responsibility of the team the patient would like to transfer to (Dr. Theodoro) to reach out to the patient if for any reason this transfer is not acceptable.   Pt has upcoming appt with Dr. Theodoro on 01/23/25. NFN.

## 2024-11-01 ENCOUNTER — Ambulatory Visit: Payer: Medicare Other

## 2025-01-23 ENCOUNTER — Ambulatory Visit
